# Patient Record
Sex: Female | Born: 1970 | Race: White | Hispanic: No | Marital: Married | Smoking: Never smoker
Health system: Southern US, Community
[De-identification: ages and names within clinical notes are randomized; demographics above are authoritative.]

## PROBLEM LIST (undated history)

## (undated) DIAGNOSIS — R0789 Other chest pain: Secondary | ICD-10-CM

## (undated) DIAGNOSIS — E559 Vitamin D deficiency, unspecified: Secondary | ICD-10-CM

## (undated) DIAGNOSIS — K219 Gastro-esophageal reflux disease without esophagitis: Secondary | ICD-10-CM

## (undated) DIAGNOSIS — I10 Essential (primary) hypertension: Secondary | ICD-10-CM

## (undated) DIAGNOSIS — R002 Palpitations: Secondary | ICD-10-CM

## (undated) DIAGNOSIS — K449 Diaphragmatic hernia without obstruction or gangrene: Secondary | ICD-10-CM

## (undated) DIAGNOSIS — E876 Hypokalemia: Secondary | ICD-10-CM

## (undated) DIAGNOSIS — M722 Plantar fascial fibromatosis: Secondary | ICD-10-CM

## (undated) DIAGNOSIS — K760 Fatty (change of) liver, not elsewhere classified: Secondary | ICD-10-CM

## (undated) DIAGNOSIS — M1990 Unspecified osteoarthritis, unspecified site: Secondary | ICD-10-CM

## (undated) DIAGNOSIS — R0602 Shortness of breath: Secondary | ICD-10-CM

## (undated) HISTORY — PX: OTHER SURGICAL HISTORY: SHX169

## (undated) HISTORY — DX: Unspecified osteoarthritis, unspecified site: M19.90

## (undated) HISTORY — DX: Other chest pain: R07.89

## (undated) HISTORY — DX: Hypokalemia: E87.6

## (undated) HISTORY — DX: Shortness of breath: R06.02

## (undated) HISTORY — DX: Vitamin D deficiency, unspecified: E55.9

## (undated) HISTORY — DX: Diaphragmatic hernia without obstruction or gangrene: K44.9

## (undated) HISTORY — DX: Fatty (change of) liver, not elsewhere classified: K76.0

## (undated) HISTORY — PX: GALLBLADDER SURGERY: SHX652

## (undated) HISTORY — DX: Plantar fascial fibromatosis: M72.2

## (undated) HISTORY — DX: Palpitations: R00.2

## (undated) HISTORY — DX: Gastro-esophageal reflux disease without esophagitis: K21.9

---

## 1995-05-16 HISTORY — PX: FOOT SURGERY: SHX648

## 2002-07-21 ENCOUNTER — Other Ambulatory Visit: Admission: RE | Admit: 2002-07-21 | Discharge: 2002-07-21 | Payer: Self-pay | Admitting: Gynecology

## 2015-08-20 ENCOUNTER — Encounter: Payer: Self-pay | Admitting: Gastroenterology

## 2015-09-06 DIAGNOSIS — J40 Bronchitis, not specified as acute or chronic: Secondary | ICD-10-CM | POA: Diagnosis not present

## 2015-10-14 DIAGNOSIS — K219 Gastro-esophageal reflux disease without esophagitis: Secondary | ICD-10-CM | POA: Diagnosis not present

## 2015-10-14 DIAGNOSIS — K449 Diaphragmatic hernia without obstruction or gangrene: Secondary | ICD-10-CM | POA: Diagnosis not present

## 2015-10-14 DIAGNOSIS — R079 Chest pain, unspecified: Secondary | ICD-10-CM | POA: Diagnosis not present

## 2015-10-19 ENCOUNTER — Ambulatory Visit (INDEPENDENT_AMBULATORY_CARE_PROVIDER_SITE_OTHER): Payer: BLUE CROSS/BLUE SHIELD | Admitting: Gastroenterology

## 2015-10-19 ENCOUNTER — Encounter: Payer: Self-pay | Admitting: Gastroenterology

## 2015-10-19 ENCOUNTER — Other Ambulatory Visit (INDEPENDENT_AMBULATORY_CARE_PROVIDER_SITE_OTHER): Payer: BLUE CROSS/BLUE SHIELD

## 2015-10-19 VITALS — BP 102/70 | HR 72 | Ht 62.5 in | Wt 200.0 lb

## 2015-10-19 DIAGNOSIS — R1013 Epigastric pain: Secondary | ICD-10-CM | POA: Diagnosis not present

## 2015-10-19 DIAGNOSIS — R634 Abnormal weight loss: Secondary | ICD-10-CM

## 2015-10-19 LAB — CBC WITH DIFFERENTIAL/PLATELET
BASOS PCT: 0.5 % (ref 0.0–3.0)
Basophils Absolute: 0 10*3/uL (ref 0.0–0.1)
EOS ABS: 0.1 10*3/uL (ref 0.0–0.7)
EOS PCT: 0.8 % (ref 0.0–5.0)
HEMATOCRIT: 41.7 % (ref 36.0–46.0)
HEMOGLOBIN: 14.1 g/dL (ref 12.0–15.0)
LYMPHS PCT: 24 % (ref 12.0–46.0)
Lymphs Abs: 1.9 10*3/uL (ref 0.7–4.0)
MCHC: 33.8 g/dL (ref 30.0–36.0)
MCV: 86.7 fl (ref 78.0–100.0)
Monocytes Absolute: 0.3 10*3/uL (ref 0.1–1.0)
Monocytes Relative: 4.3 % (ref 3.0–12.0)
Neutro Abs: 5.6 10*3/uL (ref 1.4–7.7)
Neutrophils Relative %: 70.4 % (ref 43.0–77.0)
Platelets: 269 10*3/uL (ref 150.0–400.0)
RBC: 4.81 Mil/uL (ref 3.87–5.11)
RDW: 12.6 % (ref 11.5–15.5)
WBC: 7.9 10*3/uL (ref 4.0–10.5)

## 2015-10-19 LAB — COMPREHENSIVE METABOLIC PANEL
ALBUMIN: 4.5 g/dL (ref 3.5–5.2)
ALK PHOS: 93 U/L (ref 39–117)
ALT: 15 U/L (ref 0–35)
AST: 14 U/L (ref 0–37)
BILIRUBIN TOTAL: 0.4 mg/dL (ref 0.2–1.2)
BUN: 12 mg/dL (ref 6–23)
CALCIUM: 9.8 mg/dL (ref 8.4–10.5)
CHLORIDE: 100 meq/L (ref 96–112)
CO2: 29 mEq/L (ref 19–32)
CREATININE: 0.7 mg/dL (ref 0.40–1.20)
GFR: 97.09 mL/min (ref >60.00)
Glucose, Bld: 106 mg/dL — ABNORMAL HIGH (ref 70–99)
Potassium: 2.9 mEq/L — ABNORMAL LOW (ref 3.5–5.1)
Sodium: 138 mEq/L (ref 135–145)
TOTAL PROTEIN: 7.9 g/dL (ref 6.0–8.3)

## 2015-10-19 LAB — AMYLASE: AMYLASE: 45 U/L (ref 27–131)

## 2015-10-19 LAB — LIPASE: LIPASE: 13 U/L (ref 11.0–59.0)

## 2015-10-19 NOTE — Progress Notes (Signed)
HPI :  45 y/o female here for a new patient evaluation for symptoms as outlined below. She has a reported history of GERD.    Patient reports symptoms for 6 months, worsening over the past 4 months. She reports some discomfort in her epigastric area. She reports after she swallows she has discomfort in her epigastric area with cramping almost instantly. She reports this occurs with eating routinely. She denies odynophagia. No symptoms with liquids or pills. She has some nausea, rare vomiting. She has some mild epigastric discomfort at times when not eating. She reports this with all meals. She thinks she lost 8 lbs in the past few weeks. She is eating less food due to her symptoms, she is afraid to eat due to symptoms. Symptoms can last up to 3 hours after she eats. Pain mostly in the epigastric area, does not radiate anywhere. Pain rated 2/10 at baseline, but can go anywhere from 5-10/10. Liquids go down okay during these symptoms. She denies any NSAIDs at present, she was previously taking ibuprofen but off for 5 months. No FH of esophageal, gastric cancer, or colon cancer. No prior upper endoscopy. She has been on nexium previously once daily, and then switched to Dexilant  daily last Thursday which has helped mildly. She denies any pyrosis, she does have some occasional regurgitation and acid taste in the mouth. If she does not eat she won't have any significant symptoms. She sleeps okay for the most part unless she eats late at night. She denies any blood in the stools or dark / tarry stools.    Past Medical History  Diagnosis Date  . GERD (gastroesophageal reflux disease)   . OA (osteoarthritis)   . Plantar fasciitis   . Vitamin D deficiency   . Hypokalemia      Past Surgical History  Procedure Laterality Date  . Cesarean section  1993  . Foot surgery Right 1997  . Tubal ligation     Family History  Problem Relation Age of Onset  . High blood pressure Mother   . Diabetes Mother    . Kidney cancer Mother    Social History  Substance Use Topics  . Smoking status: Never Smoker   . Smokeless tobacco: Never Used  . Alcohol Use: No   Current Outpatient Prescriptions  Medication Sig Dispense Refill  . albuterol (PROVENTIL HFA;VENTOLIN HFA) 108 (90 Base) MCG/ACT inhaler Inhale 2 puffs into the lungs every 4 (four) hours as needed for wheezing or shortness of breath.    . cyclobenzaprine (FLEXERIL) 10 MG tablet Take 10 mg by mouth 3 (three) times daily.    Marland Kitchen dexlansoprazole (DEXILANT) 60 MG capsule Take 60 mg by mouth daily.    . hydrochlorothiazide (HYDRODIURIL) 25 MG tablet Take by mouth daily. 1-2 tablets daily    . ibuprofen (ADVIL,MOTRIN) 800 MG tablet Take 800 mg by mouth 3 (three) times daily.    Marland Kitchen lisinopril (PRINIVIL,ZESTRIL) 5 MG tablet Take 5 mg by mouth daily.    Marland Kitchen loratadine (CLARITIN) 10 MG tablet Take 10 mg by mouth daily.    . potassium chloride SA (KLOR-CON M20) 20 MEQ tablet Take 20 mEq by mouth daily.    . Vitamin D, Ergocalciferol, (DRISDOL) 50000 units CAPS capsule Take 50,000 Units by mouth. Take 1 capsule twice weekly     No current facility-administered medications for this visit.   No Known Allergies   Review of Systems: All systems reviewed and negative except where noted in HPI.  No results found.  Physical Exam: BP 102/70 mmHg  Pulse 72  Ht 5' 2.5" (1.588 m)  Wt 200 lb (90.719 kg)  BMI 35.97 kg/m2  LMP 10/15/2015 Constitutional: Pleasant,well-developed, female in no acute distress. HEENT: Normocephalic and atraumatic. Conjunctivae are normal. No scleral icterus. Neck supple.  Cardiovascular: Normal rate, regular rhythm.  Pulmonary/chest: Effort normal and breath sounds normal. No wheezing, rales or rhonchi. Abdominal: Soft, nondistended, mild epigastric TTP without rebound or guarding. Bowel sounds active throughout. There are no masses palpable. No hepatomegaly. Extremities: no edema Lymphadenopathy: No cervical adenopathy  noted. Neurological: Alert and oriented to person place and time. Skin: Skin is warm and dry. No rashes noted. Psychiatric: Normal mood and affect. Behavior is normal.   ASSESSMENT AND PLAN: 45 y/o female presenting with postprandial epigastric discomfort as outlined above, with recent weight loss. DDx includes PUD, esophagitis, biliary colic, pancreatic pathology, etc. Time course seems somewhat atypical for biliary colic but possible. Given her strong postprandial component recommend EGD initially although her response to PPI has not been too significant. I discussed risks / benefits of EGD and she wished to proceed, we have an opening tomorrow so will get this done quickly. I will also send basic labs - CBC, LFTs, amylase/lipase. If EGD is negative will consider imaging to evaluate for gallstones. She agreed with the plan.   Ileene PatrickSteven Armbruster, MD [facility] Pager [phone]

## 2015-10-19 NOTE — Patient Instructions (Signed)
Your physician has requested that you go to the basement for lab work before leaving today.  You have been scheduled for an endoscopy. Please follow written instructions given to you at your visit today. If you use inhalers (even only as needed), please bring them with you on the day of your procedure. Your physician has requested that you go to www.[email or web address].[email or web address] and enter the access code given to you at your visit today. This web site gives a general overview about your procedure. However, you should still follow specific instructions given to you by our office regarding your preparation for the procedure.   

## 2015-10-20 ENCOUNTER — Ambulatory Visit (AMBULATORY_SURGERY_CENTER): Payer: BLUE CROSS/BLUE SHIELD | Admitting: Gastroenterology

## 2015-10-20 ENCOUNTER — Encounter: Payer: Self-pay | Admitting: Gastroenterology

## 2015-10-20 VITALS — BP 117/65 | HR 80 | Temp 99.3°F | Resp 23 | Ht 62.5 in | Wt 200.0 lb

## 2015-10-20 DIAGNOSIS — R1013 Epigastric pain: Secondary | ICD-10-CM | POA: Diagnosis not present

## 2015-10-20 DIAGNOSIS — K2950 Unspecified chronic gastritis without bleeding: Secondary | ICD-10-CM | POA: Diagnosis not present

## 2015-10-20 DIAGNOSIS — E109 Type 1 diabetes mellitus without complications: Secondary | ICD-10-CM | POA: Diagnosis not present

## 2015-10-20 MED ORDER — SODIUM CHLORIDE 0.9 % IV SOLN: 500.0000 mL | INTRAVENOUS | Status: DC

## 2015-10-20 NOTE — Op Note (Signed)
[facility] Patient Name: Darlene [name] Procedure Date: 10/20/2015 9:02 AM MRN: [id number] Endoscopist: Viviann Spare P. Adela Lank , MD Age: 45 Referring MD:  Date of Birth: Oct 01, 1970 Gender: Female Procedure:                Upper GI endoscopy Indications:              Epigastric abdominal pain Medicines:                Monitored Anesthesia Care Procedure:                Pre-Anesthesia Assessment:                           - Prior to the procedure, a History and Physical                            was performed, and patient medications and                            allergies were reviewed. The patient's tolerance of                            previous anesthesia was also reviewed. The risks                            and benefits of the procedure and the sedation                            options and risks were discussed with the patient.                            All questions were answered, and informed consent                            was obtained. Prior Anticoagulants: The patient has                            taken no previous anticoagulant or antiplatelet                            agents. ASA Grade Assessment: II - A patient with                            mild systemic disease. After reviewing the risks                            and benefits, the patient was deemed in                            satisfactory condition to undergo the procedure.                           After obtaining informed consent, the endoscope was  passed under direct vision. Throughout the                            procedure, the patient's blood pressure, pulse, and                            oxygen saturations were monitored continuously. The                            Model GIF-HQ190 [id number]) scope was introduced                            through the mouth, and advanced to the second part                            of duodenum. The upper GI endoscopy was                       accomplished without difficulty. The patient                            tolerated the procedure well. Scope In: Scope Out: Findings:                 Esophagogastric landmarks were identified: the                            Z-line was found at 35 cm, the gastroesophageal                            junction was found at 35 cm and the upper extent of                            the gastric folds was found at 35 cm from the                            incisors.                           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                           The exam of the esophagus was otherwise normal.                           The entire examined stomach was normal. Biopsies                            were taken with a cold forceps for Helicobacter                            pylori testing.                           The duodenal bulb and second portion of the  duodenum were normal. Complications:            No immediate complications. Estimated blood loss:                            Minimal. Estimated Blood Loss:     Estimated blood loss was minimal. Impression:               - Esophagogastric landmarks identified.                           - Medium-sized paraesophageal hernia.                           - Normal stomach. Biopsied to rule out H pylori.                           - Normal duodenal bulb and second portion of the                            duodenum. Recommendation:           - Patient has a contact number available for                            emergencies. The signs and symptoms of potential                            delayed complications were discussed with the                            patient. Return to normal activities tomorrow.                            Written discharge instructions were provided to the                            patient.                           - Resume previous diet.                           -  Continue present medications.                           - Await pathology results.                           - May consider further imaging pending path results                            to clarify size of paraesophageal hernia and rule                            out other pathology. If symptoms persist may  consider surgical referral for paraesophageal                            hernia repair if no other cause is found. Viviann SpareSteven P. Champagne Paletta, MD 10/20/2015 9:19:17 AM This report has been signed electronically.

## 2015-10-20 NOTE — Patient Instructions (Signed)
YOU HAD AN ENDOSCOPIC PROCEDURE TODAY AT THE [facility]:   Refer to the procedure report that was given to you for any specific questions about what was found during the examination.  If the procedure report does not answer your questions, please call your gastroenterologist to clarify.  If you requested that your care partner not be given the details of your procedure findings, then the procedure report has been included in a sealed envelope for you to review at your convenience later.  YOU SHOULD EXPECT: Some feelings of bloating in the abdomen. Passage of more gas than usual.  Walking can help get rid of the air that was put into your GI tract during the procedure and reduce the bloating. If you had a lower endoscopy (such as a colonoscopy or flexible sigmoidoscopy) you may notice spotting of blood in your stool or on the toilet paper. If you underwent a bowel prep for your procedure, you may not have a normal bowel movement for a few days.  Please Note:  You might notice some irritation and congestion in your nose or some drainage.  This is from the oxygen used during your procedure.  There is no need for concern and it should clear up in a day or so.  SYMPTOMS TO REPORT IMMEDIATELY:    Following upper endoscopy (EGD)  Vomiting of blood or coffee ground material  New chest pain or pain under the shoulder blades  Painful or persistently difficult swallowing  New shortness of breath  Fever of 100F or higher  Black, tarry-looking stools  For urgent or emergent issues, a gastroenterologist can be reached at any hour by calling [phone].   DIET: Your first meal following the procedure should be a small meal and then it is ok to progress to your normal diet. Heavy or fried foods are harder to digest and may make you feel nauseous or bloated.  Likewise, meals heavy in dairy and vegetables can increase bloating.  Drink plenty of fluids but you should avoid alcoholic beverages  for 24 hours.  ACTIVITY:  You should plan to take it easy for the rest of today and you should NOT DRIVE or use heavy machinery until tomorrow (because of the sedation medicines used during the test).    FOLLOW UP: Our staff will call the number listed on your records the next business day following your procedure to check on you and address any questions or concerns that you may have regarding the information given to you following your procedure. If we do not reach you, we will leave a message.  However, if you are feeling well and you are not experiencing any problems, there is no need to return our call.  We will assume that you have returned to your regular daily activities without incident.  If any biopsies were taken you will be contacted by phone or by letter within the next 1-3 weeks.  Please call us at [phone] if you have not heard about the biopsies in 3 weeks.    SIGNATURES/CONFIDENTIALITY: You and/or your care partner have signed paperwork which will be entered into your electronic medical record.  These signatures attest to the fact that that the information above on your After Visit Summary has been reviewed and is understood.  Full responsibility of the confidentiality of this discharge information lies with you and/or your care-partner.  Thank you for letting us take care of your healthcare needs today. 

## 2015-10-20 NOTE — Progress Notes (Signed)
Dental advisory given to patient 

## 2015-10-20 NOTE — Progress Notes (Signed)
A/ox3 pleased with MAC, report to Maralyn SagoSarah RN, teeth as pre-procedure

## 2015-10-20 NOTE — Progress Notes (Signed)
Called to room to assist during endoscopic procedure.  Patient ID and intended procedure confirmed with present staff. Received instructions for my participation in the procedure from the performing physician.  

## 2015-10-21 ENCOUNTER — Telehealth: Payer: Self-pay | Admitting: *Deleted

## 2015-10-21 NOTE — Telephone Encounter (Signed)
  Follow up Call-  Call back number 10/20/2015  Post procedure Call Back phone  # [phone]  Permission to leave phone message Yes     Patient questions:  Do you have a fever, pain , or abdominal swelling? No. Pain Score  0 *  Have you tolerated food without any problems? Yes.    Have you been able to return to your normal activities? Yes.    Do you have any questions about your discharge instructions: Diet   No. Medications  No. Follow up visit  No.  Do you have questions or concerns about your Care? No.  Actions: * If pain score is 4 or above: No action needed, pain <4.

## 2015-11-01 ENCOUNTER — Other Ambulatory Visit: Payer: Self-pay | Admitting: *Deleted

## 2015-11-01 ENCOUNTER — Encounter: Payer: Self-pay | Admitting: *Deleted

## 2015-11-01 DIAGNOSIS — R1013 Epigastric pain: Secondary | ICD-10-CM

## 2015-11-08 ENCOUNTER — Ambulatory Visit (INDEPENDENT_AMBULATORY_CARE_PROVIDER_SITE_OTHER)
Admission: RE | Admit: 2015-11-08 | Discharge: 2015-11-08 | Disposition: A | Discharge status: Home / Self Care | Payer: BLUE CROSS/BLUE SHIELD | Source: Ambulatory Visit | Attending: Gastroenterology | Admitting: Gastroenterology

## 2015-11-08 DIAGNOSIS — R1013 Epigastric pain: Secondary | ICD-10-CM

## 2015-11-08 DIAGNOSIS — K449 Diaphragmatic hernia without obstruction or gangrene: Secondary | ICD-10-CM | POA: Diagnosis not present

## 2015-11-08 MED ORDER — IOPAMIDOL (ISOVUE-300) INJECTION 61%
100.0000 mL | Freq: Once | INTRAVENOUS | Status: AC | PRN
  Administered 2015-11-08: 100 mL via INTRAVENOUS

## 2015-11-09 ENCOUNTER — Other Ambulatory Visit: Payer: Self-pay | Admitting: *Deleted

## 2015-11-09 ENCOUNTER — Telehealth: Payer: Self-pay | Admitting: Gastroenterology

## 2015-11-09 DIAGNOSIS — R109 Unspecified abdominal pain: Secondary | ICD-10-CM

## 2015-11-09 NOTE — Telephone Encounter (Signed)
See results note. 

## 2015-11-10 ENCOUNTER — Telehealth: Payer: Self-pay | Admitting: Gastroenterology

## 2015-11-10 NOTE — Telephone Encounter (Signed)
See results note. 

## 2015-11-12 ENCOUNTER — Ambulatory Visit (HOSPITAL_COMMUNITY)
Admission: RE | Admit: 2015-11-12 | Discharge: 2015-11-12 | Disposition: A | Discharge status: Home / Self Care | Payer: BLUE CROSS/BLUE SHIELD | Source: Ambulatory Visit | Attending: Gastroenterology | Admitting: Gastroenterology

## 2015-11-12 DIAGNOSIS — R109 Unspecified abdominal pain: Secondary | ICD-10-CM | POA: Diagnosis not present

## 2015-11-12 DIAGNOSIS — K760 Fatty (change of) liver, not elsewhere classified: Secondary | ICD-10-CM | POA: Diagnosis not present

## 2015-11-12 DIAGNOSIS — K449 Diaphragmatic hernia without obstruction or gangrene: Secondary | ICD-10-CM | POA: Diagnosis not present

## 2015-11-15 ENCOUNTER — Other Ambulatory Visit (HOSPITAL_COMMUNITY): Payer: Self-pay | Admitting: General Surgery

## 2015-11-15 DIAGNOSIS — K449 Diaphragmatic hernia without obstruction or gangrene: Secondary | ICD-10-CM

## 2015-11-17 ENCOUNTER — Other Ambulatory Visit (HOSPITAL_COMMUNITY): Payer: Self-pay | Admitting: General Surgery

## 2015-11-17 DIAGNOSIS — K449 Diaphragmatic hernia without obstruction or gangrene: Secondary | ICD-10-CM

## 2015-11-24 ENCOUNTER — Encounter (HOSPITAL_COMMUNITY)
Admission: RE | Admit: 2015-11-24 | Discharge: 2015-11-24 | Disposition: A | Discharge status: Home / Self Care | Payer: BLUE CROSS/BLUE SHIELD | Source: Ambulatory Visit | Attending: General Surgery | Admitting: General Surgery

## 2015-11-24 DIAGNOSIS — K449 Diaphragmatic hernia without obstruction or gangrene: Secondary | ICD-10-CM | POA: Insufficient documentation

## 2015-11-24 DIAGNOSIS — R109 Unspecified abdominal pain: Secondary | ICD-10-CM | POA: Diagnosis not present

## 2015-11-24 MED ORDER — TECHNETIUM TC 99M SULFUR COLLOID
2.0000 | Freq: Once | INTRAVENOUS | Status: AC | PRN
  Administered 2015-11-24: 2 via INTRAVENOUS

## 2015-11-25 ENCOUNTER — Ambulatory Visit (HOSPITAL_COMMUNITY)
Admission: RE | Admit: 2015-11-25 | Discharge: 2015-11-25 | Disposition: A | Discharge status: Home / Self Care | Payer: BLUE CROSS/BLUE SHIELD | Source: Ambulatory Visit | Attending: General Surgery | Admitting: General Surgery

## 2015-11-25 DIAGNOSIS — K449 Diaphragmatic hernia without obstruction or gangrene: Secondary | ICD-10-CM | POA: Diagnosis not present

## 2015-11-25 DIAGNOSIS — R1310 Dysphagia, unspecified: Secondary | ICD-10-CM | POA: Diagnosis not present

## 2015-12-20 ENCOUNTER — Ambulatory Visit: Payer: Self-pay | Admitting: General Surgery

## 2015-12-20 DIAGNOSIS — K449 Diaphragmatic hernia without obstruction or gangrene: Secondary | ICD-10-CM | POA: Diagnosis not present

## 2015-12-20 NOTE — H&P (Signed)
History of Present Illness Darlene Filler MD; 12/20/2015 12:06 PM) Patient words: Darlene [name].  The patient is a 45 year old [name] who presents with a hiatal Darlene [name]. 45 year old [name] who follows back up secondary to paraesophageal Darlene [name]. Patient underwent Gastrografin esophagogram which reveals a large paraesophageal Darlene [name] Patient underwent gastric emptying study which reveals normal emptying of the stomach.  Patient states continues with pain, nausea, vomiting after meals, and shortness of breath as she squats. ________________________________________________________________ Patient is a 45 year old [name] who is referred by Dr. Adela [name] for evaluation of hiatal Darlene [name]. Patient states that she's had some dysphagia. She states that she feels food gets stuck in her chest. She states that she gets full very quickly. Patient's been avoiding things like bread and meat and bread disease of been causing this to worsen. She does state that she's had some emesis. She does not endorse any reflux. States that she is short of breath when she squats down.  Patient underwent a CT scan which reveals a moderate size hiatal Darlene [name]. Patient also underwent EGD which confirms the same.   Allergies (Darlene Eversole, LPN; 12/13/1912 78:29 AM) No Known Drug Allergies06/30/2017  Medication History (Darlene Eversole, LPN; 09/17/2128 86:57 AM) ProAir HFA (108 (90 Base)MCG/ACT Aerosol Soln, Inhalation) Active. Lisinopril (  Tablet, Oral) Active. Klor-Con M20 ( Tablet ER, Oral) Active. Vitamin D (Ergocalciferol) (50000UNIT Capsule, Oral twice weekly) Active. Loratadine (  Tablet, Oral) Active. Esomeprazole Magnesium (  Capsule DR, Oral) Active. HydroCHLOROthiazide (  Tablet, Oral) Active. Ibuprofen (  Tablet, Oral) Active. Acetaminophen-Codeine #3 (300-30MG  Tablet, Oral) Active. Medications Reconciled    Review of Systems Darlene Filler, MD; 12/20/2015 12:7 PM) General  Present- Appetite Loss and Weight Loss. Not Present- Chills, Fatigue, Fever, Night Sweats and Weight Gain. Skin Not Present- Change in Wart/Mole, Dryness, Hives, Jaundice, New Lesions, Non-Healing Wounds, Rash and Ulcer. HEENT Not Present- Earache, Hearing Loss, Hoarseness, Nose Bleed, Oral Ulcers, Ringing in the Ears, Seasonal Allergies, Sinus Pain, Sore Throat, Visual Disturbances, Wears glasses/contact lenses and Yellow Eyes. Respiratory Present- Difficulty Breathing. Not Present- Bloody sputum, Chronic Cough, Snoring and Wheezing. Breast Not Present- Breast Mass, Breast Pain, Nipple Discharge and Skin Changes. Cardiovascular Present- Shortness of Breath. Not Present- Chest Pain, Difficulty Breathing Lying Down, Leg Cramps, Palpitations, Rapid Heart Rate and Swelling of Extremities. Gastrointestinal Present- Abdominal Pain, Nausea and Vomiting. Not Present- Bloating, Bloody Stool, Change in Bowel Habits, Chronic diarrhea, Constipation, Difficulty Swallowing, Excessive gas, Gets full quickly at meals, Hemorrhoids, Indigestion and Rectal Pain. [name] Not Present- Frequency, Nocturia, Painful Urination, Pelvic Pain and Urgency. Musculoskeletal Not Present- Back Pain, Joint Pain, Joint Stiffness, Muscle Pain, Muscle Weakness and Swelling of Extremities. Neurological Not Present- Decreased Memory, Fainting, Headaches, Numbness, Seizures, Tingling, Tremor, Trouble walking and Weakness. Psychiatric Not Present- Anxiety, Bipolar, Change in Sleep Pattern, Depression, Fearful and Frequent crying. Endocrine Not Present- Cold Intolerance, Excessive Hunger, Hair Changes, Heat Intolerance, Hot flashes and New Diabetes. Hematology Not Present- Blood Thinners, Easy Bruising, Excessive bleeding, Gland problems, HIV and Persistent Infections.  Vitals (Darlene Eversole LPN; 12/16/6960 95:28 AM) 12/20/2015 11:56 AM Weight: 193 lb Height: Darlene.5in Body Surface Area: 1.89 m Body Mass Index: 34.74 kg/m   Temp.: 97.63F(Oral)  Pulse: 100 (Regular)  BP: 112/80 (Sitting, Left Arm, Standard)       Physical Exam Darlene Filler, MD; 12/20/2015 12:7 PM) General Mental Status-Alert. General Appearance-Consistent with stated age. Hydration-Well hydrated. Voice-Normal.  Head and Neck Head-normocephalic, atraumatic with no lesions or palpable masses. Trachea-midline. Thyroid Gland Characteristics - normal size and consistency.  Eye Eyeball -  Bilateral-Extraocular movements intact. Sclera/Conjunctiva - Bilateral-No scleral icterus.  Chest and Lung Exam Chest and lung exam reveals -quiet, even and easy respiratory effort with no use of accessory muscles and on auscultation, normal breath sounds, no adventitious sounds and normal vocal resonance. Inspection Chest Wall - Normal. Back - normal.  Breast Breast - Left-Symmetric, Non Tender, No Biopsy scars, no Dimpling, No Inflammation, No Lumpectomy scars, No Mastectomy scars, No Peau d' Orange. Breast - Right-Symmetric, Non Tender, No Biopsy scars, no Dimpling, No Inflammation, No Lumpectomy scars, No Mastectomy scars, No Peau d' Orange. Breast Lump-No Palpable Breast Mass.  Cardiovascular Cardiovascular examination reveals -normal heart sounds, regular rate and rhythm with no murmurs and normal pedal pulses bilaterally.  Abdomen Inspection Normal Exam - No Hernias. Skin - Scar - no surgical scars. Palpation/Percussion Normal exam - Soft, Non Tender, No Rebound tenderness, No Rigidity (guarding) and No hepatosplenomegaly. Auscultation Normal exam - Bowel sounds normal.  Neurologic Neurologic evaluation reveals -alert and oriented x 3 with no impairment of recent or remote memory. Mental Status-Normal.  Musculoskeletal Normal Exam - Left-Upper Extremity Strength Normal and Lower Extremity Strength Normal. Normal Exam - Right-Upper Extremity Strength Normal and Lower Extremity Strength  Normal.  Lymphatic Head & Neck General Head & Neck Lymphatics: Bilateral - Description - Normal. Axillary General Axillary Region: Bilateral - Description - Normal. Tenderness - Non Tender. Femoral & Inguinal Generalized Femoral & Inguinal Lymphatics: Bilateral - Description - Normal. Tenderness - Non Tender.    Assessment & Plan Darlene [name](Camia Dipinto MD; 12/20/2015 12:07 PM) HIATAL Darlene [name] (K44.9) Impression: Patient is a 45 year old [name] with a moderate size hiatal Darlene [name], symptomatic  1. Will proceed to the operating room for a robotic had a Darlene [name] repair with mesh, and Nissen fundoplication. Darlene. Discussed with the patient the risks and benefits to the procedure to include but not limited to: Infection, bleeding, damage to structures, possible pneumothorax, possible recurrence. Patient was understanding and wished to proceed.

## 2016-01-03 ENCOUNTER — Ambulatory Visit (INDEPENDENT_AMBULATORY_CARE_PROVIDER_SITE_OTHER): Payer: BLUE CROSS/BLUE SHIELD | Admitting: Physician Assistant

## 2016-01-03 ENCOUNTER — Encounter: Payer: Self-pay | Admitting: Physician Assistant

## 2016-01-03 VITALS — BP 111/80 | HR 103 | Temp 97.9°F | Ht 62.5 in | Wt 190.6 lb

## 2016-01-03 DIAGNOSIS — R601 Generalized edema: Secondary | ICD-10-CM | POA: Diagnosis not present

## 2016-01-03 DIAGNOSIS — I471 Supraventricular tachycardia, unspecified: Secondary | ICD-10-CM

## 2016-01-03 DIAGNOSIS — K219 Gastro-esophageal reflux disease without esophagitis: Secondary | ICD-10-CM

## 2016-01-03 DIAGNOSIS — K449 Diaphragmatic hernia without obstruction or gangrene: Secondary | ICD-10-CM

## 2016-01-03 DIAGNOSIS — Z6834 Body mass index (BMI) 34.0-34.9, adult: Secondary | ICD-10-CM | POA: Diagnosis not present

## 2016-01-03 DIAGNOSIS — E876 Hypokalemia: Secondary | ICD-10-CM

## 2016-01-03 DIAGNOSIS — J309 Allergic rhinitis, unspecified: Secondary | ICD-10-CM | POA: Diagnosis not present

## 2016-01-03 DIAGNOSIS — R609 Edema, unspecified: Secondary | ICD-10-CM | POA: Insufficient documentation

## 2016-01-03 MED ORDER — ESOMEPRAZOLE MAGNESIUM 40 MG PO CPDR: 40.0000 mg | DELAYED_RELEASE_CAPSULE | Freq: Every day | ORAL | 11 refills | Status: DC

## 2016-01-03 NOTE — Progress Notes (Signed)
BP 111/80 (BP Location: Left Arm, Patient Position: Sitting, Cuff Size: Normal)   Pulse (!) 103   Temp 97.9 F (36.6 C) (Oral)   Ht 5' 2.5" (1.588 m)   Wt 190 lb 9.6 oz (86.5 kg)   LMP 12/31/2015   BMI 34.31 kg/m    Subjective:    Patient ID: Darlene [name], female    DOB: 05/29/1970, 45 y.o.   MRN: [id number]  HPI: Darlene [name] is a 45 y.o. female presenting on 01/03/2016 for Shortness of Breath (during work today, feels like someone is sitting on her chest, she does have a hiatal hernia that she has scheduled for surgery next month)   HPI Patient with weakness and tachycardia over the past few days.  She will be having hiatal hernia repair September 15.  She is having severe symptoms at times. No are no specific foods that seem to be more causative or not.  She will go a long periosdnwithout eating therefore I am concerned about hypoglycemia or some mild dehydration.  Discussed using supplemental shakes or meal replacement if she cannot physically eating solids.  This will eliminate long periods of not eating.  In addition stop caffeine for the sake of her intermittent tachycardia.   Relevant past medical, surgical, family and social history reviewed and updated as indicated. Interim medical history since our last visit reviewed. Allergies and medications reviewed and updated.  Review of Systems  Constitutional: Negative.  Negative for activity change, fatigue and fever.  HENT: Negative.   Eyes: Negative.   Respiratory: Positive for shortness of breath. Negative for cough, choking, chest tightness and wheezing.   Cardiovascular: Negative.  Negative for chest pain.  Gastrointestinal: Positive for abdominal pain and vomiting. Negative for blood in stool, constipation, diarrhea and nausea.  Endocrine: Negative.   Genitourinary: Negative.  Negative for dysuria.  Musculoskeletal: Negative.   Skin: Negative.   Neurological: Negative.   Hematological: Does not bruise/bleed easily.    Psychiatric/Behavioral: Negative.     Per HPI unless specifically indicated above  Social History   Social History  . Marital status: Legally Separated    Spouse name: N/A  . Number of children: 3  . Years of education: N/A   Occupational History  . TEXTILE    Social History Main Topics  . Smoking status: Never Smoker  . Smokeless tobacco: Never Used  . Alcohol use No  . Drug use: No  . Sexual activity: Not on file   Other Topics Concern  . Not on file   Social History Narrative  . No narrative on file    Past Surgical History:  Procedure Laterality Date  . CESAREAN SECTION  1993  . FOOT SURGERY Right 1997  . tubal ligation      Family History  Problem Relation Age of Onset  . High blood pressure Mother   . Diabetes Mother   . Kidney cancer Mother   . Colon cancer Neg Hx       Medication List       Accurate as of 01/03/16  4:05 PM. Always use your most recent med list.          albuterol 108 (90 Base) MCG/ACT inhaler Commonly known as:  PROVENTIL HFA;VENTOLIN HFA Inhale 2 puffs into the lungs every 4 (four) hours as needed for wheezing or shortness of breath.   esomeprazole 40 MG capsule Commonly known as:  NEXIUM Take 40 mg by mouth daily.   hydrochlorothiazide 25 MG tablet Commonly known  as:  HYDRODIURIL Take by mouth daily. 1-2 tablets daily   KLOR-CON M20 20 MEQ tablet Generic drug:  potassium chloride SA Take 20 mEq by mouth daily.   lisinopril 5 MG tablet Commonly known as:  PRINIVIL,ZESTRIL Take 5 mg by mouth daily.   loratadine 10 MG tablet Commonly known as:  CLARITIN Take 10 mg by mouth daily.   Vitamin D (Ergocalciferol) 50000 units Caps capsule Commonly known as:  DRISDOL Take 50,000 Units by mouth. Take 1 capsule twice weekly          Objective:    BP 111/80 (BP Location: Left Arm, Patient Position: Sitting, Cuff Size: Normal)   Pulse (!) 103   Temp 97.9 F (36.6 C) (Oral)   Ht 5' 2.5" (1.588 m)   Wt 190 lb 9.6  oz (86.5 kg)   LMP 12/31/2015   BMI 34.31 kg/m   Wt Readings from Last 3 Encounters:  01/03/16 190 lb 9.6 oz (86.5 kg)  10/20/15 200 lb (90.7 kg)  10/19/15 200 lb (90.7 kg)    Physical Exam  Constitutional: She is oriented to person, place, and time. She appears well-developed and well-nourished.  HENT:  Head: Normocephalic and atraumatic.  Eyes: Conjunctivae and EOM are normal. Pupils are equal, round, and reactive to light.  Neck: Normal range of motion. Neck supple.  Cardiovascular: Normal rate, regular rhythm, normal heart sounds and intact distal pulses.   Pulmonary/Chest: Effort normal and breath sounds normal.  Abdominal: Soft. Bowel sounds are normal. She exhibits no distension and no mass. There is no tenderness. There is no rebound and no guarding.  Epigastric tender mild, exacerbates pressure in thoracic area.  Neurological: She is alert and oriented to person, place, and time. She has normal reflexes.  Skin: Skin is warm and dry. No rash noted.  Psychiatric: She has a normal mood and affect. Her behavior is normal. Judgment and thought content normal.      Results for orders placed or performed in visit on 10/19/15  CBC with Differential/Platelet  Result Value Ref Range   WBC 7.9 4.0 - 10.5 K/uL   RBC 4.81 3.87 - 5.11 Mil/uL   Hemoglobin 14.1 12.0 - 15.0 g/dL   HCT 41.7 36.0 - 46.0 %   MCV 86.7 78.0 - 100.0 fl   MCHC 33.8 30.0 - 36.0 g/dL   RDW 12.6 11.5 - 15.5 %   Platelets 269.0 150.0 - 400.0 K/uL   Neutrophils Relative % 70.4 43.0 - 77.0 %   Lymphocytes Relative 24.0 12.0 - 46.0 %   Monocytes Relative 4.3 3.0 - 12.0 %   Eosinophils Relative 0.8 0.0 - 5.0 %   Basophils Relative 0.5 0.0 - 3.0 %   Neutro Abs 5.6 1.4 - 7.7 K/uL   Lymphs Abs 1.9 0.7 - 4.0 K/uL   Monocytes Absolute 0.3 0.1 - 1.0 K/uL   Eosinophils Absolute 0.1 0.0 - 0.7 K/uL   Basophils Absolute 0.0 0.0 - 0.1 K/uL  Comp Met (CMET)  Result Value Ref Range   Sodium 138 135 - 145 mEq/L    Potassium 2.9 (L) 3.5 - 5.1 mEq/L   Chloride 100 96 - 112 mEq/L   CO2 29 19 - 32 mEq/L   Glucose, Bld 106 (H) 70 - 99 mg/dL   BUN 12 6 - 23 mg/dL   Creatinine, Ser 0.70 0.40 - 1.20 mg/dL   Total Bilirubin 0.4 0.2 - 1.2 mg/dL   Alkaline Phosphatase 93 39 - 117 U/L   AST 14  0 - 37 U/L   ALT 15 0 - 35 U/L   Total Protein 7.9 6.0 - 8.3 g/dL   Albumin 4.5 3.5 - 5.2 g/dL   Calcium 9.8 8.4 - 10.5 mg/dL   GFR 97.09 >60.00 mL/min  Amylase  Result Value Ref Range   Amylase 45 27 - 131 U/L  Lipase  Result Value Ref Range   Lipase 13.0 11.0 - 59.0 U/L      Assessment & Plan:   Problem List Items Addressed This Visit      Respiratory   Allergic rhinitis   Hiatal hernia   Relevant Orders   CBC with Differential/Platelet     Digestive   Esophageal reflux   Relevant Medications   esomeprazole (NEXIUM) 40 MG capsule   Other Relevant Orders   CBC with Differential/Platelet     Other   Edema   Hypokalemia   Relevant Orders   CBC with Differential/Platelet   CMP14+EGFR   Body mass index 34.0-34.9, adult - Primary    Other Visit Diagnoses    Paroxysmal supraventricular tachycardia ([place])           Follow up plan: Return in about 2 months (around 03/04/2016) for recheck and labs.  Terald Sleeper PA-C [place] [address]  [place], [place] [address] [phone]   01/03/2016, 4:05 PM

## 2016-01-03 NOTE — Patient Instructions (Signed)
Nonspecific Tachycardia Tachycardia is a faster than normal heartbeat (more than 100 beats per minute). In adults, the heart normally beats between 60 and 100 times a minute. A fast heartbeat may be a normal response to exercise or stress. It does not necessarily mean that something is wrong. However, sometimes when your heart beats too fast it may not be able to pump enough blood to the rest of your body. This can result in chest pain, shortness of breath, dizziness, and even fainting. Nonspecific tachycardia means that the specific cause or pattern of your tachycardia is unknown. CAUSES  Tachycardia may be harmless or it may be due to a more serious underlying cause. Possible causes of tachycardia include:  Exercise or exertion.  Fever.  Pain or injury.  Infection.  Loss of body fluids (dehydration).  Overactive thyroid.  Lack of red blood cells (anemia).  Anxiety and stress.  Alcohol.  Caffeine.  Tobacco products.  Diet pills.  Illegal drugs.  Heart disease. SYMPTOMS  Rapid or irregular heartbeat (palpitations).  Suddenly feeling your heart beating (cardiac awareness).  Dizziness.  Tiredness (fatigue).  Shortness of breath.  Chest pain.  Nausea.  Fainting. DIAGNOSIS  Your caregiver will perform a physical exam and take your medical history. In some cases, a heart specialist (cardiologist) may be consulted. Your caregiver may also order:  Blood tests.  Electrocardiography. This test records the electrical activity of your heart.  A heart monitoring test. TREATMENT  Treatment will depend on the likely cause of your tachycardia. The goal is to treat the underlying cause of your tachycardia. Treatment methods may include:  Replacement of fluids or blood through an intravenous (IV) tube for moderate to severe dehydration or anemia.  New medicines or changes in your current medicines.  Diet and lifestyle changes.  Treatment for certain  infections.  Stress relief or relaxation methods. HOME CARE INSTRUCTIONS   Rest.  Drink enough fluids to keep your urine clear or pale yellow.  Do not smoke.  Avoid:  Caffeine.  Tobacco.  Alcohol.  Chocolate.  Stimulants such as over-the-counter diet pills or pills that help you stay awake.  Situations that cause anxiety or stress.  Illegal drugs such as marijuana, phencyclidine (PCP), and cocaine.  Only take medicine as directed by your caregiver.  Keep all follow-up appointments as directed by your caregiver. SEEK IMMEDIATE MEDICAL CARE IF:   You have pain in your chest, upper arms, jaw, or neck.  You become weak, dizzy, or feel faint.  You have palpitations that will not go away.  You vomit, have diarrhea, or pass blood in your stool.  Your skin is cool, pale, and wet.  You have a fever that will not go away with rest, fluids, and medicine. MAKE SURE YOU:   Understand these instructions.  Will watch your condition.  Will get help right away if you are not doing well or get worse.   This information is not intended to replace advice given to you by your health care provider. Make sure you discuss any questions you have with your health care provider.   Document Released: 06/08/2004 Document Revised: 07/24/2011 Document Reviewed: 11/13/2014 Elsevier Interactive Patient Education 2016 [employer]  

## 2016-01-04 ENCOUNTER — Encounter: Payer: Self-pay | Admitting: *Deleted

## 2016-01-04 ENCOUNTER — Encounter: Payer: Self-pay | Admitting: Physician Assistant

## 2016-01-04 LAB — CBC WITH DIFFERENTIAL/PLATELET
BASOS: 0 %
Basophils Absolute: 0 10*3/uL (ref 0.0–0.2)
EOS (ABSOLUTE): 0.1 10*3/uL (ref 0.0–0.4)
EOS: 1 %
HEMATOCRIT: 40.3 % (ref 34.0–46.6)
HEMOGLOBIN: 13.5 g/dL (ref 11.1–15.9)
IMMATURE GRANS (ABS): 0 10*3/uL (ref 0.0–0.1)
IMMATURE GRANULOCYTES: 0 %
Lymphocytes Absolute: 2.4 10*3/uL (ref 0.7–3.1)
Lymphs: 27 %
MCH: 29.8 pg (ref 26.6–33.0)
MCHC: 33.5 g/dL (ref 31.5–35.7)
MCV: 89 fL (ref 79–97)
Monocytes Absolute: 0.4 10*3/uL (ref 0.1–0.9)
Monocytes: 5 %
NEUTROS ABS: 5.7 10*3/uL (ref 1.4–7.0)
Neutrophils: 67 %
Platelets: 256 10*3/uL (ref 150–379)
RBC: 4.53 x10E6/uL (ref 3.77–5.28)
RDW: 12.5 % (ref 12.3–15.4)
WBC: 8.7 10*3/uL (ref 3.4–10.8)

## 2016-01-04 LAB — CMP14+EGFR
A/G RATIO: 1.6 (ref 1.2–2.2)
ALBUMIN: 4.4 g/dL (ref 3.5–5.5)
ALT: 12 IU/L (ref 0–32)
AST: 10 IU/L (ref 0–40)
Alkaline Phosphatase: 95 IU/L (ref 39–117)
BUN / CREAT RATIO: 15 (ref 9–23)
BUN: 13 mg/dL (ref 6–24)
CALCIUM: 9.3 mg/dL (ref 8.7–10.2)
CHLORIDE: 100 mmol/L (ref 96–106)
CO2: 24 mmol/L (ref 18–29)
Creatinine, Ser: 0.85 mg/dL (ref 0.57–1.00)
GFR, EST AFRICAN AMERICAN: 97 mL/min/{1.73_m2} (ref >59)
GFR, EST NON AFRICAN AMERICAN: 84 mL/min/{1.73_m2} (ref >59)
Globulin, Total: 2.8 g/dL (ref 1.5–4.5)
Glucose: 100 mg/dL — ABNORMAL HIGH (ref 65–99)
POTASSIUM: 4 mmol/L (ref 3.5–5.2)
Sodium: 141 mmol/L (ref 134–144)
TOTAL PROTEIN: 7.2 g/dL (ref 6.0–8.5)

## 2016-01-06 ENCOUNTER — Encounter: Payer: Self-pay | Admitting: Gastroenterology

## 2016-01-07 ENCOUNTER — Encounter (HOSPITAL_COMMUNITY)
Admission: RE | Admit: 2016-01-07 | Discharge: 2016-01-07 | Disposition: A | Discharge status: Home / Self Care | Payer: BLUE CROSS/BLUE SHIELD | Source: Ambulatory Visit | Attending: General Surgery | Admitting: General Surgery

## 2016-01-07 ENCOUNTER — Other Ambulatory Visit: Payer: Self-pay

## 2016-01-07 ENCOUNTER — Encounter (HOSPITAL_COMMUNITY): Payer: Self-pay

## 2016-01-07 DIAGNOSIS — I10 Essential (primary) hypertension: Secondary | ICD-10-CM | POA: Diagnosis not present

## 2016-01-07 DIAGNOSIS — Z01812 Encounter for preprocedural laboratory examination: Secondary | ICD-10-CM | POA: Diagnosis not present

## 2016-01-07 DIAGNOSIS — Z01818 Encounter for other preprocedural examination: Secondary | ICD-10-CM | POA: Diagnosis not present

## 2016-01-07 DIAGNOSIS — K449 Diaphragmatic hernia without obstruction or gangrene: Secondary | ICD-10-CM | POA: Insufficient documentation

## 2016-01-07 HISTORY — DX: Essential (primary) hypertension: I10

## 2016-01-07 LAB — BASIC METABOLIC PANEL
ANION GAP: 9 (ref 5–15)
BUN: 17 mg/dL (ref 6–20)
CALCIUM: 9.5 mg/dL (ref 8.9–10.3)
CO2: 27 mmol/L (ref 22–32)
Chloride: 103 mmol/L (ref 101–111)
Creatinine, Ser: 0.71 mg/dL (ref 0.44–1.00)
GFR calc Af Amer: >60 mL/min (ref >60)
GLUCOSE: 99 mg/dL (ref 65–99)
POTASSIUM: 3.2 mmol/L — AB (ref 3.5–5.1)
SODIUM: 139 mmol/L (ref 135–145)

## 2016-01-07 LAB — HCG, SERUM, QUALITATIVE: Preg, Serum: NEGATIVE

## 2016-01-07 NOTE — Patient Instructions (Addendum)
Darlene [name]  01/07/2016   Your procedure is scheduled on: 01/28/16  [facility]  [facility] to 3rd floor to  Short Stay Center at 11:00 AM.  Call this number if you have problems the morning of surgery [phone]   Remember: ONLY 1 PERSON MAY GO WITH YOU TO SHORT STAY TO GET  READY MORNING OF YOUR SURGERY.  Do not eat food or drink liquids :After Midnight.  You may have clear liquids the morning of surgery until 7:00 AM.  Then nothing to eat or drink.     Take these medicines the morning of surgery with A SIP OF WATER: Esomeprazole (Nexium), Loratidine (Claritin), Albuterol inhaler if needed.                               You may not have any metal on your body including hair pins and              piercings  Do not wear jewelry, make-up, lotions, powders or perfumes, deodorant             Do not wear nail polish.  Do not shave  48 hours prior to surgery.              Men may shave face and neck.   Do not bring valuables to the hospital. [facility] IS NOT             RESPONSIBLE   FOR VALUABLES.  Contacts, dentures or bridgework may not be worn into surgery.  Leave suitcase in the car. After surgery it may be brought to your room.               Please read over the following fact sheets you were given: _____________________________________________________________________             [facility] - Preparing for Surgery Before surgery, you can play an important role.  Because skin is not sterile, your skin needs to be as free of germs as possible.  You can reduce the number of germs on your skin by washing with CHG (chlorahexidine gluconate) soap before surgery.  CHG is an antiseptic cleaner which kills germs and bonds with the skin to continue killing germs even after washing. Please DO NOT use if you have an allergy to CHG or antibacterial soaps.  If your skin becomes reddened/irritated stop using the CHG and inform your  nurse when you arrive at Short Stay. Do not shave (including legs and underarms) for at least 48 hours prior to the first CHG shower.  You may shave your face/neck. Please follow these instructions carefully:  1.  Shower with CHG Soap the night before surgery and the  morning of Surgery.  2.  If you choose to wash your hair, wash your hair first as usual with your  normal  shampoo.  3.  After you shampoo, rinse your hair and body thoroughly to remove the  shampoo.                           4.  Use CHG as you would any other liquid soap.  You can apply chg directly  to the skin and wash  Gently with a scrungie or clean washcloth.  5.  Apply the CHG Soap to your body ONLY FROM THE NECK DOWN.   Do not use on face/ open                           Wound or open sores. Avoid contact with eyes, ears mouth and genitals (private parts).                       Wash face,  Genitals (private parts) with your normal soap.             6.  Wash thoroughly, paying special attention to the area where your surgery  will be performed.  7.  Thoroughly rinse your body with warm water from the neck down.  8.  DO NOT shower/wash with your normal soap after using and rinsing off  the CHG Soap.                9.  Pat yourself dry with a clean towel.            10.  Wear clean pajamas.            11.  Place clean sheets on your bed the night of your first shower and do not  sleep with pets. Day of Surgery : Do not apply any lotions/deodorants the morning of surgery.  Please wear clean clothes to the hospital/surgery center.  FAILURE TO FOLLOW THESE INSTRUCTIONS MAY RESULT IN THE CANCELLATION OF YOUR SURGERY PATIENT SIGNATURE_________________________________  NURSE SIGNATURE__________________________________  ________________________________________________________________________    CLEAR LIQUID DIET   Foods Allowed                                                                     Foods  Excluded  Coffee and tea, regular and decaf                             liquids that you cannot  Plain Jell-O in any flavor                                             see through such as: Fruit ices (not with fruit pulp)                                     milk, soups, orange juice  Iced Popsicles                                    All solid food Carbonated beverages, regular and diet                                    Cranberry, grape and apple juices Sports drinks like Gatorade Lightly seasoned clear broth or consume(fat free) Sugar, honey syrup  Sample Menu Breakfast                                Lunch                                     Supper Cranberry juice                    Beef broth                            Chicken broth Jell-O                                     Grape juice                           Apple juice Coffee or tea                        Jell-O                                      Popsicle                                                Coffee or tea                        Coffee or tea  _____________________________________________________________________

## 2016-01-07 NOTE — Pre-Procedure Instructions (Signed)
CBC/diff 01-03-16 epic

## 2016-01-12 ENCOUNTER — Other Ambulatory Visit: Payer: Self-pay | Admitting: *Deleted

## 2016-01-13 ENCOUNTER — Other Ambulatory Visit: Payer: Self-pay | Admitting: *Deleted

## 2016-01-13 ENCOUNTER — Other Ambulatory Visit: Payer: Self-pay | Admitting: Physician Assistant

## 2016-01-13 MED ORDER — ACETAMINOPHEN-CODEINE #3 300-30 MG PO TABS: 1.0000 | ORAL_TABLET | Freq: Four times a day (QID) | ORAL | 0 refills | Status: DC | PRN

## 2016-01-13 NOTE — Telephone Encounter (Signed)
Please see rx refill request  °

## 2016-01-14 ENCOUNTER — Ambulatory Visit: Payer: BLUE CROSS/BLUE SHIELD | Admitting: Gastroenterology

## 2016-01-28 ENCOUNTER — Encounter (HOSPITAL_COMMUNITY): Payer: Self-pay | Admitting: *Deleted

## 2016-01-28 ENCOUNTER — Encounter (HOSPITAL_COMMUNITY)
Admission: RE | Disposition: A | Discharge status: Home / Self Care | Payer: Self-pay | Source: Ambulatory Visit | Attending: General Surgery

## 2016-01-28 ENCOUNTER — Inpatient Hospital Stay (HOSPITAL_COMMUNITY): Payer: BLUE CROSS/BLUE SHIELD | Admitting: Certified Registered"

## 2016-01-28 ENCOUNTER — Observation Stay (HOSPITAL_COMMUNITY)
Admission: RE | Admit: 2016-01-28 | Discharge: 2016-01-30 | DRG: 328 | Disposition: A | Discharge status: Home / Self Care | Payer: BLUE CROSS/BLUE SHIELD | Source: Ambulatory Visit | Attending: General Surgery | Admitting: General Surgery

## 2016-01-28 DIAGNOSIS — I10 Essential (primary) hypertension: Secondary | ICD-10-CM | POA: Insufficient documentation

## 2016-01-28 DIAGNOSIS — Z79899 Other long term (current) drug therapy: Secondary | ICD-10-CM | POA: Insufficient documentation

## 2016-01-28 DIAGNOSIS — R1310 Dysphagia, unspecified: Secondary | ICD-10-CM | POA: Diagnosis not present

## 2016-01-28 DIAGNOSIS — R112 Nausea with vomiting, unspecified: Secondary | ICD-10-CM | POA: Diagnosis not present

## 2016-01-28 DIAGNOSIS — Z931 Gastrostomy status: Secondary | ICD-10-CM

## 2016-01-28 DIAGNOSIS — K449 Diaphragmatic hernia without obstruction or gangrene: Secondary | ICD-10-CM | POA: Diagnosis not present

## 2016-01-28 DIAGNOSIS — Z791 Long term (current) use of non-steroidal anti-inflammatories (NSAID): Secondary | ICD-10-CM | POA: Diagnosis not present

## 2016-01-28 HISTORY — PX: INSERTION OF MESH: SHX5868

## 2016-01-28 SURGERY — FUNDOPLICATION, NISSEN, ROBOT-ASSISTED, LAPAROSCOPIC
Anesthesia: General | Site: Abdomen

## 2016-01-28 MED ORDER — LACTATED RINGERS IR SOLN
Status: DC | PRN
  Administered 2016-01-28: 1000 mL

## 2016-01-28 MED ORDER — FENTANYL CITRATE (PF) 100 MCG/2ML IJ SOLN
INTRAMUSCULAR | Status: AC
  Filled 2016-01-28: qty 2

## 2016-01-28 MED ORDER — ALBUTEROL SULFATE (2.5 MG/3ML) 0.083% IN NEBU: 3.0000 mL | INHALATION_SOLUTION | RESPIRATORY_TRACT | Status: DC | PRN

## 2016-01-28 MED ORDER — 0.9 % SODIUM CHLORIDE (POUR BTL) OPTIME
TOPICAL | Status: DC | PRN
  Administered 2016-01-28: 1000 mL

## 2016-01-28 MED ORDER — ENOXAPARIN SODIUM 40 MG/0.4ML ~~LOC~~ SOLN
40.0000 mg | SUBCUTANEOUS | Status: DC
  Administered 2016-01-29 – 2016-01-30 (×2): 40 mg via SUBCUTANEOUS
  Filled 2016-01-28 (×2): qty 0.4

## 2016-01-28 MED ORDER — HYDROMORPHONE HCL 1 MG/ML IJ SOLN
1.0000 mg | INTRAMUSCULAR | Status: DC | PRN
  Administered 2016-01-28 (×2): 1 mg via INTRAVENOUS
  Filled 2016-01-28 (×3): qty 1

## 2016-01-28 MED ORDER — DEXAMETHASONE SODIUM PHOSPHATE 10 MG/ML IJ SOLN
INTRAMUSCULAR | Status: AC
  Filled 2016-01-28: qty 1

## 2016-01-28 MED ORDER — SUGAMMADEX SODIUM 200 MG/2ML IV SOLN
INTRAVENOUS | Status: AC
  Filled 2016-01-28: qty 2

## 2016-01-28 MED ORDER — DEXTROSE-NACL 5-0.9 % IV SOLN
INTRAVENOUS | Status: DC
  Administered 2016-01-28 – 2016-01-30 (×4): via INTRAVENOUS

## 2016-01-28 MED ORDER — PROPOFOL 10 MG/ML IV BOLUS
INTRAVENOUS | Status: AC
  Filled 2016-01-28: qty 20

## 2016-01-28 MED ORDER — MEPERIDINE HCL 50 MG/ML IJ SOLN: 6.2500 mg | INTRAMUSCULAR | Status: DC | PRN

## 2016-01-28 MED ORDER — CEFAZOLIN SODIUM-DEXTROSE 2-4 GM/100ML-% IV SOLN
2.0000 g | INTRAVENOUS | Status: AC
  Administered 2016-01-28: 2 g via INTRAVENOUS

## 2016-01-28 MED ORDER — SUCCINYLCHOLINE CHLORIDE 200 MG/10ML IV SOSY
PREFILLED_SYRINGE | INTRAVENOUS | Status: DC | PRN
  Administered 2016-01-28: 100 mg via INTRAVENOUS

## 2016-01-28 MED ORDER — ARTIFICIAL TEARS OP OINT
TOPICAL_OINTMENT | OPHTHALMIC | Status: AC
  Filled 2016-01-28: qty 3.5

## 2016-01-28 MED ORDER — FENTANYL CITRATE (PF) 100 MCG/2ML IJ SOLN
25.0000 ug | INTRAMUSCULAR | Status: DC | PRN
  Administered 2016-01-28 (×3): 25 ug via INTRAVENOUS

## 2016-01-28 MED ORDER — HYDRALAZINE HCL 20 MG/ML IJ SOLN: 10.0000 mg | INTRAMUSCULAR | Status: DC | PRN

## 2016-01-28 MED ORDER — FENTANYL CITRATE (PF) 100 MCG/2ML IJ SOLN
INTRAMUSCULAR | Status: DC | PRN
  Administered 2016-01-28: 100 ug via INTRAVENOUS
  Administered 2016-01-28 (×4): 50 ug via INTRAVENOUS

## 2016-01-28 MED ORDER — METOCLOPRAMIDE HCL 5 MG/ML IJ SOLN: 10.0000 mg | Freq: Once | INTRAMUSCULAR | Status: DC | PRN

## 2016-01-28 MED ORDER — SUGAMMADEX SODIUM 200 MG/2ML IV SOLN
INTRAVENOUS | Status: DC | PRN
  Administered 2016-01-28: 200 mg via INTRAVENOUS

## 2016-01-28 MED ORDER — MIDAZOLAM HCL 5 MG/5ML IJ SOLN
INTRAMUSCULAR | Status: DC | PRN
  Administered 2016-01-28: 2 mg via INTRAVENOUS

## 2016-01-28 MED ORDER — LIDOCAINE 2% (20 MG/ML) 5 ML SYRINGE
INTRAMUSCULAR | Status: AC
  Filled 2016-01-28: qty 5

## 2016-01-28 MED ORDER — MIDAZOLAM HCL 2 MG/2ML IJ SOLN
INTRAMUSCULAR | Status: AC
  Filled 2016-01-28: qty 2

## 2016-01-28 MED ORDER — PROPOFOL 10 MG/ML IV BOLUS
INTRAVENOUS | Status: DC | PRN
  Administered 2016-01-28: 150 mg via INTRAVENOUS

## 2016-01-28 MED ORDER — CEFAZOLIN SODIUM-DEXTROSE 2-4 GM/100ML-% IV SOLN
INTRAVENOUS | Status: AC
  Filled 2016-01-28: qty 100

## 2016-01-28 MED ORDER — DEXAMETHASONE SODIUM PHOSPHATE 10 MG/ML IJ SOLN
INTRAMUSCULAR | Status: DC | PRN
  Administered 2016-01-28: 10 mg via INTRAVENOUS

## 2016-01-28 MED ORDER — FENTANYL CITRATE (PF) 100 MCG/2ML IJ SOLN
INTRAMUSCULAR | Status: AC
  Filled 2016-01-28: qty 4

## 2016-01-28 MED ORDER — LIDOCAINE 2% (20 MG/ML) 5 ML SYRINGE
INTRAMUSCULAR | Status: DC | PRN
  Administered 2016-01-28: 40 mg via INTRAVENOUS

## 2016-01-28 MED ORDER — LACTATED RINGERS IV SOLN: INTRAVENOUS | Status: DC

## 2016-01-28 MED ORDER — ONDANSETRON HCL 4 MG/2ML IJ SOLN
4.0000 mg | Freq: Four times a day (QID) | INTRAMUSCULAR | Status: DC | PRN
  Administered 2016-01-28 – 2016-01-29 (×4): 4 mg via INTRAVENOUS
  Filled 2016-01-28 (×4): qty 2

## 2016-01-28 MED ORDER — ROCURONIUM BROMIDE 10 MG/ML (PF) SYRINGE
PREFILLED_SYRINGE | INTRAVENOUS | Status: DC | PRN
  Administered 2016-01-28 (×2): 10 mg via INTRAVENOUS
  Administered 2016-01-28: 50 mg via INTRAVENOUS

## 2016-01-28 MED ORDER — ROCURONIUM BROMIDE 100 MG/10ML IV SOLN
INTRAVENOUS | Status: AC
  Filled 2016-01-28: qty 1

## 2016-01-28 MED ORDER — ONDANSETRON 4 MG PO TBDP
4.0000 mg | ORAL_TABLET | Freq: Four times a day (QID) | ORAL | Status: DC | PRN
  Administered 2016-01-30: 4 mg via ORAL
  Filled 2016-01-28: qty 1

## 2016-01-28 MED ORDER — ACETAMINOPHEN 10 MG/ML IV SOLN
1000.0000 mg | Freq: Four times a day (QID) | INTRAVENOUS | Status: AC
  Administered 2016-01-28: 1000 mg via INTRAVENOUS
  Filled 2016-01-28 (×3): qty 100

## 2016-01-28 MED ORDER — LACTATED RINGERS IV SOLN
INTRAVENOUS | Status: AC
  Administered 2016-01-28: 15:00:00 via INTRAVENOUS
  Administered 2016-01-28: 1000 mL via INTRAVENOUS

## 2016-01-28 MED ORDER — ONDANSETRON HCL 4 MG/2ML IJ SOLN
INTRAMUSCULAR | Status: AC
  Filled 2016-01-28: qty 2

## 2016-01-28 MED ORDER — CHLORHEXIDINE GLUCONATE CLOTH 2 % EX PADS: 6.0000 | MEDICATED_PAD | Freq: Once | CUTANEOUS | Status: DC

## 2016-01-28 MED ORDER — BUPIVACAINE-EPINEPHRINE (PF) 0.5% -1:200000 IJ SOLN
INTRAMUSCULAR | Status: AC
  Filled 2016-01-28: qty 30

## 2016-01-28 MED ORDER — BUPIVACAINE-EPINEPHRINE (PF) 0.5% -1:200000 IJ SOLN
INTRAMUSCULAR | Status: DC | PRN
  Administered 2016-01-28: 30 mL

## 2016-01-28 SURGICAL SUPPLY — 57 items
APPLIER CLIP 5 13 M/L LIGAMAX5 (MISCELLANEOUS)
APPLIER CLIP ROT 10 11.4 M/L (STAPLE)
BLADE SURG SZ11 CARB STEEL (BLADE) ×2 IMPLANT
CHLORAPREP W/TINT 26ML (MISCELLANEOUS) ×2 IMPLANT
CLIP APPLIE 5 13 M/L LIGAMAX5 (MISCELLANEOUS) IMPLANT
CLIP APPLIE ROT 10 11.4 M/L (STAPLE) IMPLANT
CLIP LIGATING HEM O LOK PURPLE (MISCELLANEOUS) IMPLANT
CLIP LIGATING HEMO O LOK GREEN (MISCELLANEOUS) IMPLANT
COVER TIP SHEARS 8 DVNC (MISCELLANEOUS) IMPLANT
COVER TIP SHEARS 8MM DA VINCI (MISCELLANEOUS)
DECANTER SPIKE VIAL GLASS SM (MISCELLANEOUS) ×2 IMPLANT
DEVICE TROCAR PUNCTURE CLOSURE (ENDOMECHANICALS) IMPLANT
DRAIN PENROSE 18X1/2 LTX STRL (DRAIN) ×2 IMPLANT
DRAPE ARM DVNC X/XI (DISPOSABLE) ×4 IMPLANT
DRAPE COLUMN DVNC XI (DISPOSABLE) ×1 IMPLANT
DRAPE DA VINCI XI ARM (DISPOSABLE) ×4
DRAPE DA VINCI XI COLUMN (DISPOSABLE) ×1
DRAPE SHEET LG 3/4 BI-LAMINATE (DRAPES) IMPLANT
ELECT REM PT RETURN 9FT ADLT (ELECTROSURGICAL) ×2
ELECTRODE REM PT RTRN 9FT ADLT (ELECTROSURGICAL) ×1 IMPLANT
ENDOLOOP SUT PDS II  0 18 (SUTURE)
ENDOLOOP SUT PDS II 0 18 (SUTURE) IMPLANT
GAUZE SPONGE 4X4 16PLY XRAY LF (GAUZE/BANDAGES/DRESSINGS) ×2 IMPLANT
GLOVE BIO SURGEON STRL SZ7.5 (GLOVE) ×4 IMPLANT
GOWN STRL REUS W/TWL XL LVL3 (GOWN DISPOSABLE) ×8 IMPLANT
IRRIG SUCT STRYKERFLOW 2 WTIP (MISCELLANEOUS) ×2
IRRIGATION SUCT STRKRFLW 2 WTP (MISCELLANEOUS) ×1 IMPLANT
KIT BASIN OR (CUSTOM PROCEDURE TRAY) ×2 IMPLANT
LIQUID BAND (GAUZE/BANDAGES/DRESSINGS) ×2 IMPLANT
MARKER SKIN DUAL TIP RULER LAB (MISCELLANEOUS) ×2 IMPLANT
MESH HERNIA 7X10 (Mesh General) ×2 IMPLANT
NEEDLE HYPO 22GX1.5 SAFETY (NEEDLE) ×2 IMPLANT
NEEDLE INSUFFLATION 14GA 120MM (NEEDLE) ×2 IMPLANT
PACK CARDIOVASCULAR III (CUSTOM PROCEDURE TRAY) ×2 IMPLANT
PAD POSITIONING PINK XL (MISCELLANEOUS) ×2 IMPLANT
SCISSORS LAP 5X35 DISP (ENDOMECHANICALS) ×2 IMPLANT
SEAL CANN UNIV 5-8 DVNC XI (MISCELLANEOUS) ×4 IMPLANT
SEAL XI 5MM-8MM UNIVERSAL (MISCELLANEOUS) ×4
SEALER VESSEL DA VINCI XI (MISCELLANEOUS) ×1
SEALER VESSEL EXT DVNC XI (MISCELLANEOUS) ×1 IMPLANT
SET BI-LUMEN FLTR TB AIRSEAL (TUBING) ×2 IMPLANT
SLEEVE XCEL OPT CAN 5 100 (ENDOMECHANICALS) IMPLANT
SOLUTION ANTI FOG 6CC (MISCELLANEOUS) ×2 IMPLANT
SOLUTION ELECTROLUBE (MISCELLANEOUS) ×2 IMPLANT
STAPLER VISISTAT 35W (STAPLE) IMPLANT
SUT ETHIBOND 0 36 GRN (SUTURE) ×4 IMPLANT
SUT MNCRL AB 4-0 PS2 18 (SUTURE) ×2 IMPLANT
SUT SILK 0 SH 30 (SUTURE) ×14 IMPLANT
SUT SILK 2 0 SH (SUTURE) ×4 IMPLANT
SYR 20CC LL (SYRINGE) ×2 IMPLANT
SYRINGE 10CC LL (SYRINGE) ×2 IMPLANT
TOWEL OR 17X26 10 PK STRL BLUE (TOWEL DISPOSABLE) ×2 IMPLANT
TOWEL OR NON WOVEN STRL DISP B (DISPOSABLE) ×2 IMPLANT
TRAY FOLEY W/METER SILVER 16FR (SET/KITS/TRAYS/PACK) IMPLANT
TROCAR ADV FIXATION 5X100MM (TROCAR) ×2 IMPLANT
TUBING CONNECTING 10 (TUBING) ×2 IMPLANT
TUBING ENDO SMARTCAP PENTAX (MISCELLANEOUS) ×2 IMPLANT

## 2016-01-28 NOTE — Progress Notes (Signed)
No repeat  pregnancy test needed per dr. Karma Ganjacarginan.

## 2016-01-28 NOTE — Op Note (Signed)
01/28/2016  3:16 PM  PATIENT:  Darlene [name]  45 y.o. female  PRE-OPERATIVE DIAGNOSIS:  hiatal hernia  POST-OPERATIVE DIAGNOSIS:  hiatal hernia  PROCEDURE:  Procedure(s): XI ROBOTIC HIATAL HERNIA REPAIR WITH MESH AND  NISSEN FUNDOPLICATION (N/A) INSERTION OF MESH (N/A)  SURGEON:  Surgeon(s) and Role:    * Romie Levee, MD - Assisting    * Axel Filler, MD - Primary  ANESTHESIA:   local and general  EBL:20cc   Total I/O In: 1000 [I.V.:1000] Out: 100 [Urine:100]  BLOOD ADMINISTERED:none  DRAINS: none   LOCAL MEDICATIONS USED:  BUPIVICAINE   SPECIMEN:  No Specimen  DISPOSITION OF SPECIMEN:  N/A  COUNTS:  YES  TOURNIQUET:  * No tourniquets in log *  DICTATION: .Dragon Dictation The patient was taken back to the operating room and placed in the supine position with bilateral SCDs in place. The patient was prepped and draped in the usual sterile fashion. After appropriate antibiotics were confirmed a timeout was called and all facts were verified.   A Veress needle technique was used to insufflate the abdomen to 15 mm of mercury the paramedian stab incision. Subsequent to this an 8 mm trocar was introduced as was a 8 millimeter camera. At this time the subsequent robotic trochars x3, were then placed adjacent to this trocar approximately 8-10 cm away. Each trocar was inserted under direct visualization, there were total of 4 trochars. The assistant trocar was then placed in the right lower quadrant under direct visualization. The Nathanson retractor was then visualized inserted into the abdomen and the incision just to the left of the falciform ligament. This was then placed to retract the liver appropriately. At this time the patient was positioned in reverse Trendelenburg.   At this time the robot patient cart was brought to the bedside and placed in good position and the arms were docked to the trochars appropriately.   I proceeded to reduced the large amount of stomach  that was within the chest.  Once reduced the diaphragmatic defect appeared small approximately  2.5cm.  At this time I proceeded to incised the gastrohepatic ligament.  At this time I proceeded to mobilize the stomach inferiorly and visualize the right crus. The peritoneum over the right crus was incised and right crus was identified. I proceeded to dissect this inferiorly until the left crus was seen joining the right crus. Once the right crus was adequately dissected we turned our to the left crus which was dissected away. This required traction of the stomach to the right side. Once this was visualized we then proceeded to circumferentially dissect the esophagus away from the surrounding tissue. At this time a Penrose drain was placed around the esophagus to help with retraction. At this time the phrenoesophageal fat pad was dissected away from the esophagus. There was a 2.5cm hiatal hernia seen. I mobilized the esophagus cephalad approximately 3-4 cm, clearing away the surrounding tissue. It should be noted the esophagus was immediately anterior to the aorta.  This mobilization was done very meticulously.  At this time we turned our attention to the greater curvature the stomach and the omentum was mobilized using the robotic vessel sealer. This was taken up to the greater curvature to the hiatus. This mobilized the entire greater curvature to allow mobilization and the wrap. I then proceeded to bring the greater curvature the stomach posterior to the esophagus, and a shoeshine technique was used to evaluate the mobilization of the greater curvature.   At this  time I proceeded to close the hiatus using 2 interrupted  0 ethibond  stitches in interrupted fashion. This brought together the hiatal closure without undue stricture to the esophagus.   A piece of BIO-A u-shaped mesh was placed over the hiatal closure and sutured to the crus using 0 silk sutures x 2, and to the anterior corners of the mesh x  2..  At this time the greater curvature was brought around the esophagus and sutured using 0 silk sutures interrupted fashion approximately 1 cm apart x3. The middle suture was sutured to the esophagus. A left collar stitch was then used to gastropexy the stomach from the wrap to the diaphragm just lateral to the left crus as.  A second collar stitch was placed from the wrap to the right crus. The wrap lay at approximately 11:00 on its own with undue tension.  The wrap lay loose with no strangulation of the esophagus.  At this time the robot was undocked. The liver trocar was removed. At this time insufflation was evacuated. Skin was reapproximated for Monocryl subcuticular fashion. The skin was then dressed with Dermabond. The patient tolerated the procedure well and was taken to the recovery room in stable condition.  It should be noted Dr. Maisie Fushomas was a vital participant in helping with retraction intrabdominally, as well as helping pass sutures, penrose, and removing the above from the abdominal cavity while I was at the robotic console.   PLAN OF CARE: Admit for overnight observation  PATIENT DISPOSITION:  PACU - hemodynamically stable.   Delay start of Pharmacological VTE agent (>24hrs) due to surgical blood loss or risk of bleeding: not applicable

## 2016-01-28 NOTE — Anesthesia Postprocedure Evaluation (Signed)
Anesthesia Post Note  Patient: Sales promotion account executiveCandy [name]  Procedure(s) Performed: Procedure(s) (LRB): XI ROBOTIC HIATAL HERNIA REPAIR WITH MESH AND  NISSEN FUNDOPLICATION (N/A) INSERTION OF MESH (N/A)  Patient location during evaluation: PACU Anesthesia Type: General Level of consciousness: awake and alert Pain management: pain level controlled Vital Signs Assessment: post-procedure vital signs reviewed and stable Respiratory status: spontaneous breathing, nonlabored ventilation, respiratory function stable and patient connected to nasal cannula oxygen Cardiovascular status: blood pressure returned to baseline and stable Postop Assessment: no signs of nausea or vomiting Anesthetic complications: no    Last Vitals:  Vitals:   01/28/16 1646 01/28/16 1659  BP: 123/71 126/79  Pulse: 94 93  Resp: (!) 23 18  Temp: 36.8 C 36.7 C    Last Pain:  Vitals:   01/28/16 1630  TempSrc:   PainSc: 0-No pain                 Phillips Groutarignan, Rogan Wigley

## 2016-01-28 NOTE — H&P (Signed)
History of Present Illness  Patient words: hernia.  The patient is a 45 year old female who presents with a hiatal hernia. 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 Patient underwent Gastrografin esophagogram which reveals a large paraesophageal hernia Patient underwent gastric emptying study which reveals normal emptying of the stomach.  Patient states continues with pain, nausea, vomiting after meals, and shortness of breath as she squats. ________________________________________________________________ Patient is a 45 year old female who is referred by Dr. Adela LankArmbruster for evaluation of hiatal hernia. Patient states that she's had some dysphagia. She states that she feels food gets stuck in her chest. She states that she gets full very quickly. Patient's been avoiding things like bread and meat and bread disease of been causing this to worsen. She does state that she's had some emesis. She does not endorse any reflux. States that she is short of breath when she squats down.  Patient underwent a CT scan which reveals a moderate size hiatal hernia. Patient also underwent EGD which confirms the same.   Allergies No Known Drug Allergies06/30/2017  Medication History ProAir HFA (108 (90 Base)MCG/ACT Aerosol Soln, Inhalation) Active. Lisinopril (5MG  Tablet, Oral) Active. Klor-Con M20 (20MEQ Tablet ER, Oral) Active. Vitamin D (Ergocalciferol) (50000UNIT Capsule, Oral twice weekly) Active. Loratadine (10MG  Tablet, Oral) Active. Esomeprazole Magnesium (40MG  Capsule DR, Oral) Active. HydroCHLOROthiazide (25MG  Tablet, Oral) Active. Ibuprofen (800MG  Tablet, Oral) Active. Acetaminophen-Codeine #3 (300-30MG  Tablet, Oral) Active. Medications Reconciled    Review of Systems  General Present- Appetite Loss and Weight Loss. Not Present- Chills, Fatigue, Fever, Night Sweats and Weight Gain. Skin Not Present- Change in Wart/Mole, Dryness,  Hives, Jaundice, New Lesions, Non-Healing Wounds, Rash and Ulcer. HEENT Not Present- Earache, Hearing Loss, Hoarseness, Nose Bleed, Oral Ulcers, Ringing in the Ears, Seasonal Allergies, Sinus Pain, Sore Throat, Visual Disturbances, Wears glasses/contact lenses and Yellow Eyes. Respiratory Present- Difficulty Breathing. Not Present- Bloody sputum, Chronic Cough, Snoring and Wheezing. Breast Not Present- Breast Mass, Breast Pain, Nipple Discharge and Skin Changes. Cardiovascular Present- Shortness of Breath. Not Present- Chest Pain, Difficulty Breathing Lying Down, Leg Cramps, Palpitations, Rapid Heart Rate and Swelling of Extremities. Gastrointestinal Present- Abdominal Pain, Nausea and Vomiting. Not Present- Bloating, Bloody Stool, Change in Bowel Habits, Chronic diarrhea, Constipation, Difficulty Swallowing, Excessive gas, Gets full quickly at meals, Hemorrhoids, Indigestion and Rectal Pain. Female Genitourinary Not Present- Frequency, Nocturia, Painful Urination, Pelvic Pain and Urgency. Musculoskeletal Not Present- Back Pain, Joint Pain, Joint Stiffness, Muscle Pain, Muscle Weakness and Swelling of Extremities. Neurological Not Present- Decreased Memory, Fainting, Headaches, Numbness, Seizures, Tingling, Tremor, Trouble walking and Weakness. Psychiatric Not Present- Anxiety, Bipolar, Change in Sleep Pattern, Depression, Fearful and Frequent crying. Endocrine Not Present- Cold Intolerance, Excessive Hunger, Hair Changes, Heat Intolerance, Hot flashes and New Diabetes. Hematology Not Present- Blood Thinners, Easy Bruising, Excessive bleeding, Gland problems, HIV and Persistent Infections.  BP 119/78   Pulse 78   Temp 98.7 F (37.1 C) (Oral)   Resp 16   Ht 5' 2.5" (1.588 m)   Wt 85.3 kg (188 lb)   LMP 01/27/2016   SpO2 100%   BMI 33.84 kg/m    Physical Exam  General Mental Status-Alert. General Appearance-Consistent with stated age. Hydration-Well  hydrated. Voice-Normal.  Head and Neck Head-normocephalic, atraumatic with no lesions or palpable masses. Trachea-midline. Thyroid Gland Characteristics - normal size and consistency.  Eye Eyeball - Bilateral-Extraocular movements intact. Sclera/Conjunctiva - Bilateral-No scleral icterus.  Chest and Lung Exam Chest and lung exam reveals -quiet, even and easy respiratory  effort with no use of accessory muscles and on auscultation, normal breath sounds, no adventitious sounds and normal vocal resonance. Inspection Chest Wall - Normal. Back - normal.  Breast Breast - Left-Symmetric, Non Tender, No Biopsy scars, no Dimpling, No Inflammation, No Lumpectomy scars, No Mastectomy scars, No Peau d' Orange. Breast - Right-Symmetric, Non Tender, No Biopsy scars, no Dimpling, No Inflammation, No Lumpectomy scars, No Mastectomy scars, No Peau d' Orange. Breast Lump-No Palpable Breast Mass.  Cardiovascular Cardiovascular examination reveals -normal heart sounds, regular rate and rhythm with no murmurs and normal pedal pulses bilaterally.  Abdomen Inspection Normal Exam - No Hernias. Skin - Scar - no surgical scars. Palpation/Percussion Normal exam - Soft, Non Tender, No Rebound tenderness, No Rigidity (guarding) and No hepatosplenomegaly. Auscultation Normal exam - Bowel sounds normal.  Neurologic Neurologic evaluation reveals -alert and oriented x 3 with no impairment of recent or remote memory. Mental Status-Normal.  Musculoskeletal Normal Exam - Left-Upper Extremity Strength Normal and Lower Extremity Strength Normal. Normal Exam - Right-Upper Extremity Strength Normal and Lower Extremity Strength Normal.  Lymphatic Head & Neck General Head & Neck Lymphatics: Bilateral - Description - Normal. Axillary General Axillary Region: Bilateral - Description - Normal. Tenderness - Non Tender. Femoral & Inguinal Generalized Femoral & Inguinal  Lymphatics: Bilateral - Description - Normal. Tenderness - Non Tender.    Assessment & Plan  HIATAL HERNIA (K44.9) Impression: Patient is a 45 year old female with a moderate size hiatal hernia, symptomatic  1. Will proceed to the operating room for a robotic had a hernia repair with mesh, and Nissen fundoplication. 2. Discussed with the patient the risks and benefits to the procedure to include but not limited to: Infection, bleeding, damage to structures, possible pneumothorax, possible recurrence. Patient was understanding and wished to proceed.

## 2016-01-28 NOTE — Anesthesia Preprocedure Evaluation (Signed)
Anesthesia Evaluation  Patient identified by MRN, date of birth, ID band Patient awake    Reviewed: Allergy & Precautions, NPO status , Patient's Chart, lab work & pertinent test results  Airway Mallampati: II  TM Distance: >3 FB Neck ROM: Full    Dental no notable dental hx.    Pulmonary neg pulmonary ROS,    Pulmonary exam normal breath sounds clear to auscultation       Cardiovascular hypertension, negative cardio ROS Normal cardiovascular exam Rhythm:Regular Rate:Normal     Neuro/Psych negative neurological ROS  negative psych ROS   GI/Hepatic Neg liver ROS, hiatal hernia,   Endo/Other  negative endocrine ROS  Renal/GU negative Renal ROS  negative genitourinary   Musculoskeletal negative musculoskeletal ROS (+)   Abdominal   Peds negative pediatric ROS (+)  Hematology negative hematology ROS (+)   Anesthesia Other Findings   Reproductive/Obstetrics negative OB ROS                             Anesthesia Physical Anesthesia Plan  ASA: II  Anesthesia Plan: General   Post-op Pain Management:    Induction: Intravenous, Rapid sequence and Cricoid pressure planned  Airway Management Planned: Oral ETT  Additional Equipment:   Intra-op Plan:   Post-operative Plan: Extubation in OR  Informed Consent: I have reviewed the patients History and Physical, chart, labs and discussed the procedure including the risks, benefits and alternatives for the proposed anesthesia with the patient or authorized representative who has indicated his/her understanding and acceptance.   Dental advisory given  Plan Discussed with: CRNA  Anesthesia Plan Comments:         Anesthesia Quick Evaluation

## 2016-01-28 NOTE — Transfer of Care (Signed)
Immediate Anesthesia Transfer of Care Note  Patient: Darlene [name]  Procedure(s) Performed: Procedure(s): XI ROBOTIC HIATAL HERNIA REPAIR WITH MESH AND  NISSEN FUNDOPLICATION (N/A) INSERTION OF MESH (N/A)  Patient Location: PACU  Anesthesia Type:General  Level of Consciousness:  sedated, patient cooperative and responds to stimulation  Airway & Oxygen Therapy:Patient Spontanous Breathing and Patient connected to face mask oxgen  Post-op Assessment:  Report given to PACU RN and Post -op Vital signs reviewed and stable  Post vital signs:  Reviewed and stable  Last Vitals:  Vitals:   01/28/16 1046  BP: 119/78  Pulse: 78  Resp: 16  Temp: 37.1 C    Complications: No apparent anesthesia complications

## 2016-01-28 NOTE — Anesthesia Procedure Notes (Signed)
Procedure Name: Intubation Date/Time: 01/28/2016 12:45 PM Performed by: Early OsmondEARGLE, Mica Ramdass E Pre-anesthesia Checklist: Patient identified, Emergency Drugs available, Suction available and Patient being monitored Patient Re-evaluated:Patient Re-evaluated prior to inductionOxygen Delivery Method: Circle system utilized Preoxygenation: Pre-oxygenation with 100% oxygen Intubation Type: IV induction, Rapid sequence and Cricoid Pressure applied Laryngoscope Size: Miller and 3 Grade View: Grade I Tube type: Oral Tube size: 7.0 mm Number of attempts: 1 Airway Equipment and Method: Stylet and Oral airway Placement Confirmation: ETT inserted through vocal cords under direct vision,  positive ETCO2 and breath sounds checked- equal and bilateral Secured at: 21 cm Tube secured with: Tape Dental Injury: Teeth and Oropharynx as per pre-operative assessment

## 2016-01-28 NOTE — Discharge Instructions (Signed)
EATING AFTER YOUR ESOPHAGEAL SURGERY °(Stomach Fundoplication, Hiatal Hernia repair, Achalasia surgery, etc) ° °###################################################################### ° °EAT °Start with a pureed / full liquid diet (see below) °Gradually transition to a high fiber diet with a fiber supplement over the next month after discharge.   ° °WALK °Walk an hour a day.  Control your pain to do that.   ° °CONTROL PAIN °Control pain so that you can walk, sleep, tolerate sneezing/coughing, go up/down stairs. ° °HAVE A BOWEL MOVEMENT DAILY °Keep your bowels regular to avoid problems.  OK to try a laxative to override constipation.  OK to use an antidairrheal to slow down diarrhea.  Call if not better after 2 tries ° °CALL IF YOU HAVE PROBLEMS/CONCERNS °Call if you are still struggling despite following these instructions. °Call if you have concerns not answered by these instructions ° °###################################################################### ° ° °After your esophageal surgery, expect some sticking with swallowing over the next 1-2 months.   ° °If food sticks when you eat, it is called "dysphagia".  This is due to swelling around your esophagus at the wrap & hiatal diaphragm repair.  It will gradually ease off over the next few months.  To help you through this temporary phase, we start you out on a pureed (blenderized) diet. ° °Your first meal in the hospital was thin liquids.  You should have been given a pureed diet by the time you left the hospital.  We ask patients to stay on a pureed diet for the first 2-3 weeks to avoid anything getting "stuck" near your recent surgery.  Don't be alarmed if your ability to swallow doesn't progress according to this plan.  Everyone is different and some diets can advance more or less quickly.   ° ° °Some BASIC RULES to follow are: °· Maintain an upright position whenever eating or drinking. °· Take small bites - just a teaspoon size bite at a time. °· Eat slowly.   It may also help to eat only one food at a time. °· Consider nibbling through smaller, more frequent meals & avoid the urge to eat BIG meals °· Do not push through feelings of fullness, nausea, or bloatedness °· Do not mix solid foods and liquids in the same mouthful °· Try not to "wash foods down" with large gulps of liquids. °· Avoid carbonated (bubbly/fizzy) drinks.   °· Avoid foods that make you feel gassy or bloated.  Start with bland foods first.  Wait on trying greasy, fried, or spicy meals until you are tolerating more bland solids well. °· Understand that it will be hard to burp and belch at first.  This gradually improves with time.  Expect to be more gassy/flatulent/bloated initially.  Walking will help your body manage it better. °· Consider using medications for bloating that contain simethicone such as  Maalox or Gas-X  °· Eat in a relaxed atmosphere & minimize distractions. °· Avoid talking while eating.   °· Do not use straws. °· Following each meal, sit in an upright position (90 degree angle) for 60 to 90 minutes.  Going for a short walk can help as well °· If food does stick, don't panic.  Try to relax and let the food pass on its own.  Sipping WARM LIQUID such as strong hot black tea can also help slide it down. ° ° °Be gradual in changes & use common sense: ° °-If you easily tolerating a certain "level" of foods, advance to the next level gradually °-If you are   having trouble swallowing a particular food, then avoid it.   °-If food is sticking when you advance your diet, go back to thinner previous diet (the lower LEVEL) for 1-2 days. ° °LEVEL 1 = PUREED DIET ° °Do for the first 2 WEEKS AFTER SURGERY ° °-Foods in this group are pureed or blenderized to a smooth, mashed potato-like consistency.  °-If necessary, the pureed foods can keep their shape with the addition of a thickening agent.   °-Meat should be pureed to a smooth, pasty consistency.  Hot broth or gravy may be added to the pureed  meat, approximately 1 oz. of liquid per 3 oz. serving of meat. °-CAUTION:  If any foods do not puree into a smooth consistency, swallowing will be more difficult.  (For example, nuts or seeds sometimes do not blend well.) ° °Hot Foods Cold Foods  °Pureed scrambled eggs and cheese Pureed cottage cheese  °Baby cereals Thickened juices and nectars  °Thinned cooked cereals (no lumps) Thickened milk or eggnog  °Pureed French toast or pancakes Ensure  °Mashed potatoes Ice cream  °Pureed parsley, au gratin, scalloped potatoes, candied sweet potatoes Fruit or Italian ice, sherbet  °Pureed buttered or alfredo noodles Plain yogurt  °Pureed vegetables (no corn or peas) Instant breakfast  °Pureed soups and creamed soups Smooth pudding, mousse, custard  °Pureed scalloped apples Whipped gelatin  °Gravies Sugar, syrup, honey, jelly  °Sauces, cheese, tomato, barbecue, white, creamed Cream  °Any baby food Creamer  °Alcohol in moderation (not beer or champagne) Margarine  °Coffee or tea Mayonnaise  ° Ketchup, mustard  ° Apple sauce  ° °SAMPLE MENU:  PUREED DIET °Breakfast Lunch Dinner  °· Orange juice, 1/2 cup °· Cream of wheat, 1/2 cup · Pineapple juice, 1/2 cup · Pureed turkey, barley soup, 3/4 cup °· Pureed Hawaiian chicken, 3 oz  °· Scrambled eggs, mashed or blended with cheese, 1/2 cup °· Tea or coffee, 1 cup  °· Whole milk, 1 cup  °· Non-dairy creamer, 2 Tbsp. · Mashed potatoes, 1/2 cup °· Pureed cooled broccoli, 1/2 cup °· Apple sauce, 1/2 cup °· Coffee or tea · Mashed potatoes, 1/2 cup °· Pureed spinach, 1/2 cup °· Frozen yogurt, 1/2 cup °· Tea or coffee  ° ° ° ° °LEVEL 2 = SOFT DIET ° °After your first 2 weeks, you can advance to a soft diet.   °Keep on this diet until everything goes down easily. ° °Hot Foods Cold Foods  °White fish Cottage cheese  °Stuffed fish Junior baby fruit  °Baby food meals Semi thickened juices  °Minced soft cooked, scrambled, poached eggs nectars  °Souffle & omelets Ripe mashed bananas  °Cooked  cereals Canned fruit, pineapple sauce, milk  °potatoes Milkshake  °Buttered or Alfredo noodles Custard  °Cooked cooled vegetable Puddings, including tapioca  °Sherbet Yogurt  °Vegetable soup or alphabet soup Fruit ice, Italian ice  °Gravies Whipped gelatin  °Sugar, syrup, honey, jelly Junior baby desserts  °Sauces:  Cheese, creamed, barbecue, tomato, white Cream  °Coffee or tea Margarine  ° °SAMPLE MENU:  LEVEL 2 °Breakfast Lunch Dinner  °· Orange juice, 1/2 cup °· Oatmeal, 1/2 cup °· Scrambled eggs with cheese, 1/2 cup °· Decaffeinated tea, 1 cup °· Whole milk, 1 cup °· Non-dairy creamer, 2 Tbsp · Pineapple juice, 1/2 cup °· Minced beef, 3 oz °· Gravy, 2 Tbsp °· Mashed potatoes, 1/2 cup °· Minced fresh broccoli, 1/2 cup °· Applesauce, 1/2 cup °· Coffee, 1 cup · Turkey, barley soup, 3/4 cup °·   Minced Hawaiian chicken, 3 oz °· Mashed potatoes, 1/2 cup °· Cooked spinach, 1/2 cup °· Frozen yogurt, 1/2 cup °· Non-dairy creamer, 2 Tbsp  ° ° ° ° °LEVEL 3 = CHOPPED DIET ° °-After all the foods in level 2 (soft diet) are passing through well you should advance up to more chopped foods.  °-It is still important to cut these foods into small pieces and eat slowly. ° °Hot Foods Cold Foods  °Poultry Cottage cheese  °Chopped Swedish meatballs Yogurt  °Meat salads (ground or flaked meat) Milk  °Flaked fish (tuna) Milkshakes  °Poached or scrambled eggs Soft, cold, dry cereal  °Souffles and omelets Fruit juices or nectars  °Cooked cereals Chopped canned fruit  °Chopped French toast or pancakes Canned fruit cocktail  °Noodles or pasta (no rice) Pudding, mousse, custard  °Cooked vegetables (no frozen peas, corn, or mixed vegetables) Green salad  °Canned small sweet peas Ice cream  °Creamed soup or vegetable soup Fruit ice, Italian ice  °Pureed vegetable soup or alphabet soup Non-dairy creamer  °Ground scalloped apples Margarine  °Gravies Mayonnaise  °Sauces:  Cheese, creamed, barbecue, tomato, white Ketchup  °Coffee or tea Mustard   ° °SAMPLE MENU:  LEVEL 3 °Breakfast Lunch Dinner  °· Orange juice, 1/2 cup °· Oatmeal, 1/2 cup °· Scrambled eggs with cheese, 1/2 cup °· Decaffeinated tea, 1 cup °· Whole milk, 1 cup °· Non-dairy creamer, 2 Tbsp °· Ketchup, 1 Tbsp °· Margarine, 1 tsp °· Salt, 1/4 tsp °· Sugar, 2 tsp · Pineapple juice, 1/2 cup °· Ground beef, 3 oz °· Gravy, 2 Tbsp °· Mashed potatoes, 1/2 cup °· Cooked spinach, 1/2 cup °· Applesauce, 1/2 cup °· Decaffeinated coffee °· Whole milk °· Non-dairy creamer, 2 Tbsp °· Margarine, 1 tsp °· Salt, 1/4 tsp · Pureed turkey, barley soup, 3/4 cup °· Barbecue chicken, 3 oz °· Mashed potatoes, 1/2 cup °· Ground fresh broccoli, 1/2 cup °· Frozen yogurt, 1/2 cup °· Decaffeinated tea, 1 cup °· Non-dairy creamer, 2 Tbsp °· Margarine, 1 tsp °· Salt, 1/4 tsp °· Sugar, 1 tsp  ° ° °LEVEL 4:  REGULAR FOODS ° °-Foods in this group are soft, moist, regularly textured foods.   °-This level includes meat and breads, which tend to be the hardest things to swallow.   °-Eat very slowly, chew well and continue to avoid carbonated drinks. °-most people are at this level in 4-6 weeks ° °Hot Foods Cold Foods  °Baked fish or skinned Soft cheeses - cottage cheese  °Souffles and omelets Cream cheese  °Eggs Yogurt  °Stuffed shells Milk  °Spaghetti with meat sauce Milkshakes  °Cooked cereal Cold dry cereals (no nuts, dried fruit, coconut)  °French toast or pancakes Crackers  °Buttered toast Fruit juices or nectars  °Noodles or pasta (no rice) Canned fruit  °Potatoes (all types) Ripe bananas  °Soft, cooked vegetables (no corn, lima, or baked beans) Peeled, ripe, fresh fruit  °Creamed soups or vegetable soup Cakes (no nuts, dried fruit, coconut)  °Canned chicken noodle soup Plain doughnuts  °Gravies Ice cream  °Bacon dressing Pudding, mousse, custard  °Sauces:  Cheese, creamed, barbecue, tomato, white Fruit ice, Italian ice, sherbet  °Decaffeinated tea or coffee Whipped gelatin  °Pork chops Regular gelatin  ° Canned fruited  gelatin molds  ° Sugar, syrup, honey, jam, jelly  ° Cream  ° Non-dairy  ° Margarine  ° Oil  ° Mayonnaise  ° Ketchup  ° Mustard  ° °TROUBLESHOOTING IRREGULAR BOWELS  °1) Avoid extremes of bowel   movements (no bad constipation/diarrhea)  °2) Miralax 17gm mixed in 8oz. water or juice-daily. May use BID as needed.  °3) Gas-x,Phazyme, etc. as needed for gas & bloating.  °4) Soft,bland diet. No spicy,greasy,fried foods.  °5) Prilosec over-the-counter as needed  °6) May hold gluten/wheat products from diet to see if symptoms improve.  °7) May try probiotics (Align, Activa, etc) to help calm the bowels down  °7) If symptoms become worse call back immediately. ° ° ° °If you have any questions please call our office at [facility]: [phone]. ° °

## 2016-01-29 ENCOUNTER — Inpatient Hospital Stay (HOSPITAL_COMMUNITY): Payer: BLUE CROSS/BLUE SHIELD

## 2016-01-29 DIAGNOSIS — Z79899 Other long term (current) drug therapy: Secondary | ICD-10-CM | POA: Diagnosis not present

## 2016-01-29 DIAGNOSIS — K449 Diaphragmatic hernia without obstruction or gangrene: Secondary | ICD-10-CM | POA: Diagnosis not present

## 2016-01-29 DIAGNOSIS — R1310 Dysphagia, unspecified: Secondary | ICD-10-CM | POA: Diagnosis not present

## 2016-01-29 DIAGNOSIS — I10 Essential (primary) hypertension: Secondary | ICD-10-CM | POA: Diagnosis not present

## 2016-01-29 LAB — BASIC METABOLIC PANEL
ANION GAP: 5 (ref 5–15)
BUN: 17 mg/dL (ref 6–20)
CO2: 27 mmol/L (ref 22–32)
Calcium: 8.7 mg/dL — ABNORMAL LOW (ref 8.9–10.3)
Chloride: 109 mmol/L (ref 101–111)
Creatinine, Ser: 0.64 mg/dL (ref 0.44–1.00)
Glucose, Bld: 186 mg/dL — ABNORMAL HIGH (ref 65–99)
POTASSIUM: 3.7 mmol/L (ref 3.5–5.1)
SODIUM: 141 mmol/L (ref 135–145)

## 2016-01-29 MED ORDER — HYDROCODONE-ACETAMINOPHEN 7.5-325 MG/15ML PO SOLN
10.0000 mL | ORAL | Status: DC | PRN
  Administered 2016-01-29 – 2016-01-30 (×5): 10 mL via ORAL
  Filled 2016-01-29 (×6): qty 15

## 2016-01-29 NOTE — Progress Notes (Signed)
1 Day Post-Op Rob Nissen Subjective: Had some nausea and "neck pressure" overnight.  Somewhat better today.  Feels light headed when she gets up.    Objective: Vital signs in last 24 hours: Temp:  [97.6 F (36.4 C)-98.7 F (37.1 C)] 97.7 F (36.5 C) (09/16 0607) Pulse Rate:  [64-109] 77 (09/16 0607) Resp:  [16-23] 18 (09/16 0607) BP: (93-126)/(55-80) 97/58 (09/16 0607) SpO2:  [95 %-100 %] 100 % (09/16 0607) Weight:  [85.3 kg (188 lb)] 85.3 kg (188 lb) (09/15 1100)   Intake/Output from previous day: 09/15 0701 - 09/16 0700 In: 4040 [I.V.:4040] Out: 500 [Urine:450; Blood:50] Intake/Output this shift: No intake/output data recorded.   General appearance: alert and cooperative GI: soft, non-distended  Incision: no significant drainage  Lab Results:  No results for input(s): WBC, HGB, HCT, PLT in the last 72 hours. BMET  Recent Labs  01/29/16 0430  NA 141  K 3.7  CL 109  CO2 27  GLUCOSE 186*  BUN 17  CREATININE 0.64  CALCIUM 8.7*   PT/INR No results for input(s): LABPROT, INR in the last 72 hours. ABG No results for input(s): PHART, HCO3 in the last 72 hours.  Invalid input(s): PCO2, PO2  MEDS, Scheduled . enoxaparin (LOVENOX) injection  40 mg Subcutaneous Q24H    Studies/Results: No results found.  Assessment: s/p Procedure(s): XI ROBOTIC HIATAL HERNIA REPAIR WITH MESH AND  NISSEN FUNDOPLICATION INSERTION OF MESH Patient Active Problem List   Diagnosis Date Noted  . S/P Nissen fundoplication (with gastrostomy tube placement) ([facility]) 01/28/2016  . Allergic rhinitis 01/03/2016  . Edema 01/03/2016  . Hypokalemia 01/03/2016  . Hiatal hernia 01/03/2016  . Esophageal reflux 01/03/2016  . Body mass index 34.0-34.9, adult 01/03/2016      Plan: Esophagram today.  If neg for leak, will start liquid diet  Crushed meds only once films are done Doesn't seem quite ready for discharge today   LOS: 1 day     .Vanita PandaAlicia C Delshon Blanchfield, MD [facility] [place] [phone]   01/29/2016 7:46 AM

## 2016-01-30 ENCOUNTER — Other Ambulatory Visit: Payer: Self-pay | Admitting: General Surgery

## 2016-01-30 DIAGNOSIS — R1310 Dysphagia, unspecified: Secondary | ICD-10-CM | POA: Diagnosis not present

## 2016-01-30 DIAGNOSIS — Z79899 Other long term (current) drug therapy: Secondary | ICD-10-CM | POA: Diagnosis not present

## 2016-01-30 DIAGNOSIS — K449 Diaphragmatic hernia without obstruction or gangrene: Secondary | ICD-10-CM | POA: Diagnosis not present

## 2016-01-30 DIAGNOSIS — I10 Essential (primary) hypertension: Secondary | ICD-10-CM | POA: Diagnosis not present

## 2016-01-30 MED ORDER — HYDROCODONE-ACETAMINOPHEN 7.5-325 MG/15ML PO SOLN: 10.0000 mL | ORAL | 0 refills | Status: DC | PRN

## 2016-01-30 MED ORDER — IOPAMIDOL (ISOVUE-300) INJECTION 61%
50.0000 mL | Freq: Once | INTRAVENOUS | Status: AC | PRN
  Administered 2016-01-29: 50 mL via ORAL

## 2016-01-30 NOTE — Progress Notes (Signed)
Assessment unchanged. Nausea subsided. Pt verbalized understanding of dc instructions through teach back regarding follow up care, diet, medications and when to call the doctor. Script x 1 given as provided by MD. Discharged via wc to front entrance to meet awaiting vehicle to carry home. Accompanied by mom and NT.

## 2016-01-30 NOTE — Discharge Summary (Signed)
Physician Discharge Summary  Patient ID: Darlene [name] MRN: [id number] DOB/AGE: July 08, 1970 45 y.o.  Admit date: 01/28/2016 Discharge date: 01/30/2016  Admission Diagnoses: hiatal hernia  Discharge Diagnoses:  Active Problems:   S/P Nissen fundoplication (with gastrostomy tube placement) [facility](HCC)   Discharged Condition: good  Hospital Course: Patient admitted after surgery.  Esophagram the following day showed no leak.  She tolerated clears without difficulty.  It was felt that she was in stable condition for discharge home.    Consults: None  Significant Diagnostic Studies: labs: cbc, chemistry  Treatments: IV hydration, analgesia: acetaminophen w/ codeine and surgery: hiatal hernia repair  Discharge Exam: Blood pressure 109/69, pulse 92, temperature 98.9 F (37.2 C), temperature source Oral, resp. rate 18, height 5' 2.5" (1.588 m), weight 85.3 kg (188 lb), last menstrual period 01/27/2016, SpO2 92 %. General appearance: alert and cooperative GI: soft, non-distended Incision/Wound: clean, dry, intact  Disposition: home    Medication List    TAKE these medications   acetaminophen-codeine 300-30 MG tablet Commonly known as:  TYLENOL #3 Take 1-2 tablets by mouth every 6 (six) hours as needed for moderate pain.   albuterol 108 (90 Base) MCG/ACT inhaler Commonly known as:  PROVENTIL HFA;VENTOLIN HFA Inhale 2 puffs into the lungs every 4 (four) hours as needed for wheezing or shortness of breath.   esomeprazole 40 MG capsule Commonly known as:  NEXIUM Take 1 capsule (40 mg total) by mouth daily. What changed:  when to take this   hydrochlorothiazide 25 MG tablet Commonly known as:  HYDRODIURIL Take 25 mg by mouth daily with breakfast.   HYDROcodone-acetaminophen 7.5-325 mg/15 ml solution Commonly known as:  HYCET Take 10 mLs by mouth every 4 (four) hours as needed for moderate pain.   ibuprofen 800 MG tablet Commonly known as:  ADVIL,MOTRIN Take 800 mg by mouth 3  (three) times daily as needed for headache.   KLOR-CON M20 20 MEQ tablet Generic drug:  potassium chloride SA Take 20 mEq by mouth daily with breakfast.   lisinopril 5 MG tablet Commonly known as:  PRINIVIL,ZESTRIL Take 5 mg by mouth daily with breakfast.   loratadine 10 MG tablet Commonly known as:  CLARITIN Take 10 mg by mouth daily with breakfast.   Vitamin D (Ergocalciferol) 50000 units Caps capsule Commonly known as:  DRISDOL Take 50,000 Units by mouth 2 (two) times a week. Takes on Sunday's and Wednesday's      Follow-up Information    Marigene Ehlersamirez Jr., Jed LimerickArmando, MD. Schedule an appointment as soon as possible for a visit in 2 week(s).   Specialty:  General Surgery Why:  For wound re-check Contact information: [address] [place] [address] [phone]           Signed: Vanita PandaHOMAS, Vincent Streater C. 01/30/2016, 7:35 AM

## 2016-01-30 NOTE — Progress Notes (Signed)
Dr. Maisie Fushomas aware of recent nausea. No vomiting noted. Zofran SL tab given. Md to call antiemetic in to pt's home pharmacy.

## 2016-01-31 ENCOUNTER — Encounter (HOSPITAL_COMMUNITY): Payer: Self-pay | Admitting: General Surgery

## 2016-02-02 ENCOUNTER — Encounter: Payer: Self-pay | Admitting: Physician Assistant

## 2016-02-06 ENCOUNTER — Encounter: Payer: Self-pay | Admitting: Physician Assistant

## 2016-02-07 ENCOUNTER — Encounter: Payer: Self-pay | Admitting: Physician Assistant

## 2016-02-07 MED ORDER — BENZONATATE 200 MG PO CAPS: 200.0000 mg | ORAL_CAPSULE | Freq: Three times a day (TID) | ORAL | 0 refills | Status: DC | PRN

## 2016-03-16 ENCOUNTER — Ambulatory Visit (INDEPENDENT_AMBULATORY_CARE_PROVIDER_SITE_OTHER): Payer: BLUE CROSS/BLUE SHIELD | Admitting: Gastroenterology

## 2016-03-16 ENCOUNTER — Encounter: Payer: Self-pay | Admitting: Gastroenterology

## 2016-03-16 VITALS — BP 110/70 | HR 72 | Ht 62.0 in | Wt 186.5 lb

## 2016-03-16 DIAGNOSIS — K760 Fatty (change of) liver, not elsewhere classified: Secondary | ICD-10-CM

## 2016-03-16 DIAGNOSIS — K449 Diaphragmatic hernia without obstruction or gangrene: Secondary | ICD-10-CM

## 2016-03-16 NOTE — Progress Notes (Signed)
HPI :  45 year old female with history as below, here for a follow-up visit. She initially presented a few months ago with epigastric discomfort after swallowing. EGD 10/20/2015 - paraesophageal hernia, otherwise normal, no evidence of H pylori  CT scan confirmed large hernia, otherwise no acute process noted US showed no gallstones, but hepatic steatosis noted Gastric emptying study normal  Patient referred to surgery to discuss hernia repair, and had hernia repair with Nissen fundoplication in September. She reports it went well, saw surgeon yesterday for follow up. She reports her symptoms have "100% resolved". Her pain has resolved, she has been able to eat without trouble and pain, feels great in general. She has had been on nexium previously, but had questions about this moving forward.   No FH of liver disease. No cirrhosis known. She denies any alcohol use. She reports weight has been good, she has lost 30 lbs since the past few months, trying to so. She does not like to drink coffee    Past Medical History:  Diagnosis Date  . GERD (gastroesophageal reflux disease)   . Hiatal hernia   . Hypertension   . Hypokalemia   . OA (osteoarthritis)   . Plantar fasciitis   . Vitamin D deficiency      Past Surgical History:  Procedure Laterality Date  . CESAREAN SECTION  1993  . FOOT SURGERY Right 1997  . INSERTION OF MESH N/A 01/28/2016   Procedure: INSERTION OF MESH;  Surgeon: Axel FillerArmando Ramirez, MD;  Location: [facility];  Service: General;  Laterality: N/A;  . Right Knee Surgery    . tubal ligation     Family History  Problem Relation Age of Onset  . High blood pressure Mother   . Diabetes Mother   . Kidney cancer Mother   . Colon cancer Neg Hx    Social History  Substance Use Topics  . Smoking status: Never Smoker  . Smokeless tobacco: Never Used  . Alcohol use No   Current Outpatient Prescriptions  Medication Sig Dispense Refill  . acetaminophen-codeine (TYLENOL #3)  300-30 MG tablet Take 1-2 tablets by mouth every 6 (six) hours as needed for moderate pain. 90 tablet 0  . albuterol (PROVENTIL HFA;VENTOLIN HFA) 108 (90 Base) MCG/ACT inhaler Inhale 2 puffs into the lungs every 4 (four) hours as needed for wheezing or shortness of breath.    . esomeprazole (NEXIUM) 40 MG capsule Take 1 capsule (40 mg total) by mouth daily. (Patient taking differently: Take 40 mg by mouth daily with breakfast. ) 30 capsule 11  . hydrochlorothiazide (HYDRODIURIL) 25 MG tablet Take 25 mg by mouth daily with breakfast.     . ibuprofen (ADVIL,MOTRIN) 800 MG tablet Take 800 mg by mouth 3 (three) times daily as needed for headache.     . lisinopril (PRINIVIL,ZESTRIL) 5 MG tablet Take 5 mg by mouth daily with breakfast.     . loratadine (CLARITIN) 10 MG tablet Take 10 mg by mouth daily with breakfast.     . potassium chloride SA (KLOR-CON M20) 20 MEQ tablet Take 20 mEq by mouth daily with breakfast.     . Vitamin D, Ergocalciferol, (DRISDOL) 50000 units CAPS capsule Take 50,000 Units by mouth 2 (two) times a week. Takes on Sunday's and Wednesday's     No current facility-administered medications for this visit.    No Known Allergies   Review of Systems: All systems reviewed and negative except where noted in HPI.    Lab Results  Component  Value Date   ALT 12 01/03/2016   AST 10 01/03/2016   ALKPHOS 95 01/03/2016   BILITOT <0.2 01/03/2016     Physical Exam: BP 110/70 (BP Location: Left Arm, Patient Position: Sitting, Cuff Size: Normal)   Pulse 72   Ht 5\' 2"  (1.575 m) Comment: height measured without shoes  Wt 186 lb 8 oz (84.6 kg)   LMP 02/13/2016   BMI 34.11 kg/m  Constitutional: Pleasant,well-developed, female in no acute distress. HEENT: Normocephalic and atraumatic. Conjunctivae are normal. No scleral icterus. Neck supple.  Cardiovascular: Normal rate, regular rhythm.  Pulmonary/chest: Effort normal and breath sounds normal. No wheezing, rales or  rhonchi. Abdominal: Soft, nondistended, nontender. . There are no masses palpable. No hepatomegaly. Extremities: no edema Lymphadenopathy: No cervical adenopathy noted. Neurological: Alert and oriented to person place and time. Skin: Skin is warm and dry. No rashes noted. Psychiatric: Normal mood and affect. Behavior is normal.   ASSESSMENT AND PLAN: 45 year old female here for reassessment of the following issues:  Hiatal hernia - large and symptomatic, now status post repair with Nissen fundoplication, symptoms have completely resolved. I counseled her I don't think she needs to continue Nexium any further if she is not having any reflux symptoms. She can follow up as needed with any recurrent symptoms.  Fatty liver- noted incidentally on imaging. Her ALT is normal. I counseled her on the spectrum of fatty liver disease, potential risks of hepatic inflammation/fibrosis/cirrhosis. She has lost a significant amount of weight since her last visit, ALT normal. She should have her LFTs checked once yearly moving forward. I counseled her that routine coffee drinking can be helpful to prevent fibrotic changes. She will continue to abstain from alcohol. All questions answered.  Ileene PatrickSteven Kalanie Fewell, MD [facility] Pager [phone]

## 2016-03-16 NOTE — Patient Instructions (Addendum)
Please follow up with Dr. Adela LankArmbruster in 1 year.

## 2016-03-28 DIAGNOSIS — Z1231 Encounter for screening mammogram for malignant neoplasm of breast: Secondary | ICD-10-CM | POA: Diagnosis not present

## 2016-04-03 ENCOUNTER — Ambulatory Visit (INDEPENDENT_AMBULATORY_CARE_PROVIDER_SITE_OTHER): Payer: BLUE CROSS/BLUE SHIELD | Admitting: Physician Assistant

## 2016-04-03 ENCOUNTER — Encounter: Payer: Self-pay | Admitting: Physician Assistant

## 2016-04-03 VITALS — BP 112/79 | HR 90 | Temp 97.8°F | Ht 62.0 in | Wt 186.6 lb

## 2016-04-03 DIAGNOSIS — Z01419 Encounter for gynecological examination (general) (routine) without abnormal findings: Secondary | ICD-10-CM | POA: Diagnosis not present

## 2016-04-03 DIAGNOSIS — Z23 Encounter for immunization: Secondary | ICD-10-CM | POA: Diagnosis not present

## 2016-04-03 DIAGNOSIS — M47816 Spondylosis without myelopathy or radiculopathy, lumbar region: Secondary | ICD-10-CM

## 2016-04-03 MED ORDER — ACETAMINOPHEN-CODEINE 300-30 MG PO TABS: 1.0000 | ORAL_TABLET | Freq: Three times a day (TID) | ORAL | 0 refills | Status: DC | PRN

## 2016-04-03 MED ORDER — ACETAMINOPHEN-CODEINE #3 300-30 MG PO TABS: 1.0000 | ORAL_TABLET | Freq: Four times a day (QID) | ORAL | 0 refills | Status: DC | PRN

## 2016-04-03 NOTE — Patient Instructions (Signed)
Food Choices for Gastroesophageal Reflux Disease, Adult When you have gastroesophageal reflux disease (GERD), the foods you eat and your eating habits are very important. Choosing the right foods can help ease your discomfort. What guidelines do I need to follow?  Choose fruits, vegetables, whole grains, and low-fat dairy products.  Choose low-fat meat, fish, and poultry.  Limit fats such as oils, salad dressings, butter, nuts, and avocado.  Keep a food diary. This helps you identify foods that cause symptoms.  Avoid foods that cause symptoms. These may be different for everyone.  Eat small meals often instead of 3 large meals a day.  Eat your meals slowly, in a place where you are relaxed.  Limit fried foods.  Cook foods using methods other than frying.  Avoid drinking alcohol.  Avoid drinking large amounts of liquids with your meals.  Avoid bending over or lying down until 2-3 hours after eating. What foods are not recommended? These are some foods and drinks that may make your symptoms worse: Vegetables  Tomatoes. Tomato juice. Tomato and spaghetti sauce. Chili peppers. Onion and garlic. Horseradish. Fruits  Oranges, grapefruit, and lemon (fruit and juice). Meats  High-fat meats, fish, and poultry. This includes hot dogs, ribs, ham, sausage, salami, and bacon. Dairy  Whole milk and chocolate milk. Sour cream. Cream. Butter. Ice cream. Cream cheese. Drinks  Coffee and tea. Bubbly (carbonated) drinks or energy drinks. Condiments  Hot sauce. Barbecue sauce. Sweets/Desserts  Chocolate and cocoa. Donuts. Peppermint and spearmint. Fats and Oils  High-fat foods. This includes French fries and potato chips. Other  Vinegar. Strong spices. This includes black pepper, white pepper, red pepper, cayenne, curry powder, cloves, ginger, and chili powder. The items listed above may not be a complete list of foods and drinks to avoid. Contact your dietitian for more information.    This information is not intended to replace advice given to you by your health care provider. Make sure you discuss any questions you have with your health care provider. Document Released: 10/31/2011 Document Revised: 10/07/2015 Document Reviewed: 03/05/2013 Elsevier Interactive Patient Education  2017 Elsevier Inc.  

## 2016-04-03 NOTE — Progress Notes (Signed)
BP 112/79   Pulse 90   Temp 97.8 F (36.6 C) (Oral)   Ht 5\' 2"  (1.575 m)   Wt 186 lb 9.6 oz (84.6 kg)   LMP 02/13/2016   BMI 34.13 kg/m    Subjective:    Patient ID: Darlene [name], female    DOB: [birth year], 45 y.o.   MRN: [id number]  HPI: Darlene [name] is a 45 y.o. female presenting on 04/03/2016 for Annual Exam  This patient comes in for annual check and pap and recheck on medications and conditions. All medications are reviewed today. There are no reports of any problems with the medications. All of the medical conditions are reviewed and updated.  Lab work is reviewed and will be ordered as medically necessary. There are no new problems reported with today's visit.   Past Medical History:  Diagnosis Date  . GERD (gastroesophageal reflux disease)   . Hiatal hernia   . Hypertension   . Hypokalemia   . OA (osteoarthritis)   . Plantar fasciitis   . Vitamin D deficiency    Relevant past medical, surgical, family and social history reviewed and updated as indicated. Interim medical history since our last visit reviewed. Allergies and medications reviewed and updated. DATA REVIEWED: CHART IN EPIC  Social History   Social History  . Marital status: Legally Separated    Spouse name: N/A  . Number of children: 3  . Years of education: N/A   Occupational History  . TEXTILE    Social History Main Topics  . Smoking status: Never Smoker  . Smokeless tobacco: Never Used  . Alcohol use No  . Drug use: No  . Sexual activity: Not on file   Other Topics Concern  . Not on file   Social History Narrative  . No narrative on file    Past Surgical History:  Procedure Laterality Date  . CESAREAN SECTION  1993  . FOOT SURGERY Right 1997  . INSERTION OF MESH N/A 01/28/2016   Procedure: INSERTION OF MESH;  Surgeon: Axel Filler, MD;  Location: [facility];  Service: General;  Laterality: N/A;  . Right Knee Surgery    . tubal ligation      Family History  Problem Relation Age  of Onset  . High blood pressure Mother   . Diabetes Mother   . Kidney cancer Mother   . Colon cancer Neg Hx     Review of Systems  Constitutional: Negative.  Negative for activity change, fatigue and fever.  HENT: Negative.   Eyes: Negative.   Respiratory: Negative.  Negative for cough.   Cardiovascular: Negative.  Negative for chest pain.  Gastrointestinal: Negative.  Negative for abdominal pain.  Endocrine: Negative.   Genitourinary: Negative.  Negative for dysuria.  Musculoskeletal: Negative.   Skin: Negative.   Neurological: Negative.       Medication List       Accurate as of 04/03/16  5:05 PM. Always use your most recent med list.          acetaminophen-codeine 300-30 MG tablet Commonly known as:  TYLENOL #3 Take 1-2 tablets by mouth every 6 (six) hours as needed for moderate pain.   Acetaminophen-Codeine 300-30 MG tablet Take 1 tablet by mouth every 8 (eight) hours as needed for pain.   Acetaminophen-Codeine 300-30 MG tablet Take 1 tablet by mouth every 8 (eight) hours as needed for pain.   albuterol 108 (90 Base) MCG/ACT inhaler Commonly known as:  PROVENTIL HFA;VENTOLIN HFA Inhale 2 puffs  into the lungs every 4 (four) hours as needed for wheezing or shortness of breath.   hydrochlorothiazide 25 MG tablet Commonly known as:  HYDRODIURIL Take 25 mg by mouth daily with breakfast.   ibuprofen 800 MG tablet Commonly known as:  ADVIL,MOTRIN Take 800 mg by mouth 3 (three) times daily as needed for headache.   KLOR-CON M20 20 MEQ tablet Generic drug:  potassium chloride SA Take 20 mEq by mouth daily with breakfast.   lisinopril 5 MG tablet Commonly known as:  PRINIVIL,ZESTRIL Take 5 mg by mouth daily with breakfast.   loratadine 10 MG tablet Commonly known as:  CLARITIN Take 10 mg by mouth daily with breakfast.   Vitamin D (Ergocalciferol) 50000 units Caps capsule Commonly known as:  DRISDOL Take 50,000 Units by mouth 2 (two) times a week. Takes on  Sunday's and Wednesday's          Objective:    BP 112/79   Pulse 90   Temp 97.8 F (36.6 C) (Oral)   Ht 5\' 2"  (1.575 m)   Wt 186 lb 9.6 oz (84.6 kg)   LMP 02/13/2016   BMI 34.13 kg/m   No Known Allergies  Wt Readings from Last 3 Encounters:  04/03/16 186 lb 9.6 oz (84.6 kg)  03/16/16 186 lb 8 oz (84.6 kg)  01/28/16 188 lb (85.3 kg)    Physical Exam  Constitutional: She is oriented to person, place, and time. She appears well-developed and well-nourished.  HENT:  Head: Normocephalic and atraumatic.  Eyes: Conjunctivae and EOM are normal. Pupils are equal, round, and reactive to light.  Neck: Normal range of motion. Neck supple.  Cardiovascular: Normal rate, regular rhythm, normal heart sounds and intact distal pulses.   Pulmonary/Chest: Effort normal and breath sounds normal. Right breast exhibits no mass, no skin change and no tenderness. Left breast exhibits no mass, no skin change and no tenderness. Breasts are symmetrical.  Abdominal: Soft. Bowel sounds are normal.  Genitourinary: Vagina normal and uterus normal. Rectal exam shows no fissure. No breast swelling, tenderness, discharge or bleeding. There is no tenderness or lesion on the right labia. There is no tenderness or lesion on the left labia. Uterus is not deviated, not enlarged and not tender. Cervix exhibits no motion tenderness, no discharge and no friability. Right adnexum displays no mass, no tenderness and no fullness. Left adnexum displays no mass, no tenderness and no fullness. No tenderness or bleeding in the vagina. No vaginal discharge found.  Neurological: She is alert and oriented to person, place, and time. She has normal reflexes.  Skin: Skin is warm and dry. No rash noted.  Psychiatric: She has a normal mood and affect. Her behavior is normal. Judgment and thought content normal.        Assessment & Plan:   1. Well female exam with routine gynecological exam - Pap IG w/ reflex to HPV when  ASC-U  2. Spondylosis of lumbar region without myelopathy or radiculopathy - acetaminophen-codeine (TYLENOL #3) 300-30 MG tablet; Take 1-2 tablets by mouth every 6 (six) hours as needed for moderate pain.  Dispense: 90 tablet; Refill: 0  3. Encounter for immunization - Flu Vaccine QUAD 36+ mos IM    Continue all other maintenance medications as listed above.  Follow up plan: Return in about 3 months (around 07/04/2016).  Orders Placed This Encounter  Procedures  . Flu Vaccine QUAD 36+ mos IM    Educational handout given for GERD  Remus LofflerAngel S. Anabeth Chilcott PA-C [facility] [address]  [place], [place] [address] [phone]   04/03/2016, 5:05 PM

## 2016-04-05 LAB — PAP IG W/ RFLX HPV ASCU: PAP Smear Comment: 0

## 2016-07-04 ENCOUNTER — Ambulatory Visit (INDEPENDENT_AMBULATORY_CARE_PROVIDER_SITE_OTHER): Payer: BLUE CROSS/BLUE SHIELD | Admitting: Physician Assistant

## 2016-07-04 ENCOUNTER — Encounter: Payer: Self-pay | Admitting: Physician Assistant

## 2016-07-04 VITALS — BP 107/71 | HR 87 | Temp 99.0°F | Ht 62.0 in | Wt 190.6 lb

## 2016-07-04 DIAGNOSIS — F411 Generalized anxiety disorder: Secondary | ICD-10-CM

## 2016-07-04 DIAGNOSIS — M722 Plantar fascial fibromatosis: Secondary | ICD-10-CM

## 2016-07-04 DIAGNOSIS — M47816 Spondylosis without myelopathy or radiculopathy, lumbar region: Secondary | ICD-10-CM | POA: Diagnosis not present

## 2016-07-04 DIAGNOSIS — M159 Polyosteoarthritis, unspecified: Secondary | ICD-10-CM

## 2016-07-04 MED ORDER — ACETAMINOPHEN-CODEINE #3 300-30 MG PO TABS: 1.0000 | ORAL_TABLET | Freq: Four times a day (QID) | ORAL | 0 refills | Status: DC | PRN

## 2016-07-04 MED ORDER — ACETAMINOPHEN-CODEINE 300-30 MG PO TABS: 1.0000 | ORAL_TABLET | Freq: Three times a day (TID) | ORAL | 0 refills | Status: DC | PRN

## 2016-07-04 MED ORDER — CITALOPRAM HYDROBROMIDE 20 MG PO TABS: 20.0000 mg | ORAL_TABLET | Freq: Every day | ORAL | 1 refills | Status: DC

## 2016-07-04 NOTE — Patient Instructions (Signed)
Adjustment Disorder Adjustment disorder is an unusually severe reaction to a stressful life event, such as the loss of a job or physical illness. The event may be any stressful event other than the loss of a loved one. Adjustment disorder may affect your feelings, your thinking, how you act, or a combination of these. It may interfere with personal relationships or with the way you are at work, school, or home. People with this disorder are at risk for suicide and substance abuse. They may develop a more serious mental disorder, such as major depressive disorder or post-traumatic stress disorder. SIGNS AND SYMPTOMS  Symptoms may include:  Sadness, depressed mood, or crying spells.  Loss of enjoyment.  Change in appetite or weight.  Sense of loss or hopelessness.  Thoughts of suicide.  Anxiety, worry, or nervousness.  Trouble sleeping.  Avoiding family and friends.  Poor school performance.  Fighting or vandalism.  Reckless driving.  Skipping school.  Poor work performance.  Ignoring bills. Symptoms of adjustment disorder start within 3 months of the stressful life event. They do not last more than 6 months after the event has ended. DIAGNOSIS  To make a diagnosis, your health care provider will ask about what has happened in your life and how it has affected you. He or she may also ask about your medical history and use of medicines, alcohol, and other substances. Your health care provider may do a physical exam and order lab tests or other studies. You may be referred to a mental health specialist for evaluation. TREATMENT  Treatment options include:  Counseling or talk therapy. Talk therapy is usually provided by mental health specialists.  Medicine. Certain medicines may help with depression, anxiety, and sleep.  Support groups. Support groups offer emotional support, advice, and guidance. They are made up of people who have had similar experiences. HOME CARE  INSTRUCTIONS  Keep all follow-up visits as directed by your health care provider. This is important.  Take medicines only as directed by your health care provider. SEEK MEDICAL CARE IF:  Your symptoms get worse.  SEEK IMMEDIATE MEDICAL CARE IF: You have serious thoughts about hurting yourself or someone else. MAKE SURE YOU:  Understand these instructions.  Will watch your condition.  Will get help right away if you are not doing well or get worse. This information is not intended to replace advice given to you by your health care provider. Make sure you discuss any questions you have with your health care provider. Document Released: 01/03/2006 Document Revised: 08/23/2015 Document Reviewed: 09/23/2013 Elsevier Interactive Patient Education  2017 Elsevier Inc.  

## 2016-07-04 NOTE — Progress Notes (Signed)
BP 107/71   Pulse 87   Temp 99 F (37.2 C) (Oral)   Ht 5\' 2"  (1.575 m)   Wt 190 lb 9.6 oz (86.5 kg)   BMI 34.86 kg/m    Subjective:    Patient ID: Darlene [name], female    DOB: 02/16/1971, 46 y.o.   MRN: [id number]  HPI: Darlene [name] is a 46 y.o. female presenting on 07/04/2016 for Follow-up (3 month ) and Medication Refill  This patient comes in for periodic recheck on medications and conditions including spondylosis, plantar fasciitis. In addition she has great increase in general anxiety that is daily and persistent. Denies panic attacks. Currently in a job situation that she cannot control and will not be over quickly.  Understands that she has to be able to cope and deal with the difficult situation and people..   All medications are reviewed today. There are no reports of any problems with the medications. All of the medical conditions are reviewed and updated.  Lab work is reviewed and will be ordered as medically necessary. There are no new problems reported with today's visit.   Relevant past medical, surgical, family and social history reviewed and updated as indicated. Allergies and medications reviewed and updated.  Past Medical History:  Diagnosis Date  . GERD (gastroesophageal reflux disease)   . Hiatal hernia   . Hypertension   . Hypokalemia   . OA (osteoarthritis)   . Plantar fasciitis   . Vitamin D deficiency     Past Surgical History:  Procedure Laterality Date  . CESAREAN SECTION  1993  . FOOT SURGERY Right 1997  . INSERTION OF MESH N/A 01/28/2016   Procedure: INSERTION OF MESH;  Surgeon: Axel FillerArmando Ramirez, MD;  Location: [facility];  Service: General;  Laterality: N/A;  . Right Knee Surgery    . tubal ligation      Review of Systems  Constitutional: Negative.  Negative for activity change, fatigue and fever.  HENT: Negative.   Eyes: Negative.   Respiratory: Negative.  Negative for cough.   Cardiovascular: Negative.  Negative for chest pain.    Gastrointestinal: Negative.  Negative for abdominal pain.  Endocrine: Negative.   Genitourinary: Negative.  Negative for dysuria.  Musculoskeletal: Negative.   Skin: Negative.   Neurological: Negative.     Allergies as of 07/04/2016   No Known Allergies     Medication List       Accurate as of 07/04/16  5:10 PM. Always use your most recent med list.          Acetaminophen-Codeine 300-30 MG tablet Take 1 tablet by mouth every 8 (eight) hours as needed for pain.   Acetaminophen-Codeine 300-30 MG tablet Take 1 tablet by mouth every 8 (eight) hours as needed for pain.   acetaminophen-codeine 300-30 MG tablet Commonly known as:  TYLENOL #3 Take 1-2 tablets by mouth every 6 (six) hours as needed for moderate pain.   albuterol 108 (90 Base) MCG/ACT inhaler Commonly known as:  PROVENTIL HFA;VENTOLIN HFA Inhale 2 puffs into the lungs every 4 (four) hours as needed for wheezing or shortness of breath.   citalopram 20 MG tablet Commonly known as:  CELEXA Take 1 tablet (20 mg total) by mouth daily.   hydrochlorothiazide 25 MG tablet Commonly known as:  HYDRODIURIL Take 25 mg by mouth daily with breakfast.   ibuprofen 800 MG tablet Commonly known as:  ADVIL,MOTRIN Take 800 mg by mouth 3 (three) times daily as needed for headache.  KLOR-CON M20 20 MEQ tablet Generic drug:  potassium chloride SA Take 20 mEq by mouth daily with breakfast.   lisinopril 5 MG tablet Commonly known as:  PRINIVIL,ZESTRIL Take 5 mg by mouth daily with breakfast.   loratadine 10 MG tablet Commonly known as:  CLARITIN Take 10 mg by mouth daily with breakfast.   Vitamin D (Ergocalciferol) 50000 units Caps capsule Commonly known as:  DRISDOL Take 50,000 Units by mouth 2 (two) times a week. Takes on Sunday's and Wednesday's          Objective:    BP 107/71   Pulse 87   Temp 99 F (37.2 C) (Oral)   Ht 5\' 2"  (1.575 m)   Wt 190 lb 9.6 oz (86.5 kg)   BMI 34.86 kg/m   No Known  Allergies  Physical Exam  Constitutional: She is oriented to person, place, and time. She appears well-developed and well-nourished.  HENT:  Head: Normocephalic and atraumatic.  Eyes: Conjunctivae and EOM are normal. Pupils are equal, round, and reactive to light.  Cardiovascular: Normal rate, regular rhythm, normal heart sounds and intact distal pulses.   Pulmonary/Chest: Effort normal and breath sounds normal.  Abdominal: Soft. Bowel sounds are normal.  Musculoskeletal: She exhibits tenderness.       Lumbar back: She exhibits decreased range of motion, tenderness and pain.  Neurological: She is alert and oriented to person, place, and time. She has normal reflexes.  Skin: Skin is warm and dry. No rash noted.  Psychiatric: She has a normal mood and affect. Her behavior is normal. Judgment and thought content normal.        Assessment & Plan:   1. Generalized OA - Acetaminophen-Codeine 300-30 MG tablet; Take 1 tablet by mouth every 8 (eight) hours as needed for pain.  Dispense: 90 tablet; Refill: 0 - Acetaminophen-Codeine 300-30 MG tablet; Take 1 tablet by mouth every 8 (eight) hours as needed for pain.  Dispense: 90 tablet; Refill: 0 - acetaminophen-codeine (TYLENOL #3) 300-30 MG tablet; Take 1-2 tablets by mouth every 6 (six) hours as needed for moderate pain.  Dispense: 90 tablet; Refill: 0  2. Plantar fasciitis - Acetaminophen-Codeine 300-30 MG tablet; Take 1 tablet by mouth every 8 (eight) hours as needed for pain.  Dispense: 90 tablet; Refill: 0 - Acetaminophen-Codeine 300-30 MG tablet; Take 1 tablet by mouth every 8 (eight) hours as needed for pain.  Dispense: 90 tablet; Refill: 0 - acetaminophen-codeine (TYLENOL #3) 300-30 MG tablet; Take 1-2 tablets by mouth every 6 (six) hours as needed for moderate pain.  Dispense: 90 tablet; Refill: 0  3. GAD (generalized anxiety disorder) - citalopram (CELEXA) 20 MG tablet; Take 1 tablet (20 mg total) by mouth daily.  Dispense: 30 tablet;  Refill: 1  4. Spondylosis of lumbar region without myelopathy or radiculopathy - Acetaminophen-Codeine 300-30 MG tablet; Take 1 tablet by mouth every 8 (eight) hours as needed for pain.  Dispense: 90 tablet; Refill: 0 - Acetaminophen-Codeine 300-30 MG tablet; Take 1 tablet by mouth every 8 (eight) hours as needed for pain.  Dispense: 90 tablet; Refill: 0 - acetaminophen-codeine (TYLENOL #3) 300-30 MG tablet; Take 1-2 tablets by mouth every 6 (six) hours as needed for moderate pain.  Dispense: 90 tablet; Refill: 0   Continue all other maintenance medications as listed above.  Follow up plan: Return in about 4 weeks (around 08/01/2016) for recheck med.  Educational handout given for adjustment disorder  Remus Loffler PA-C [facility] [address]  [address]  [place], [place] [address] [phone]   07/04/2016, 5:10 PM

## 2016-07-14 ENCOUNTER — Encounter: Payer: Self-pay | Admitting: Physician Assistant

## 2016-07-18 ENCOUNTER — Other Ambulatory Visit: Payer: Self-pay

## 2016-07-18 MED ORDER — IBUPROFEN 800 MG PO TABS: 800.0000 mg | ORAL_TABLET | Freq: Three times a day (TID) | ORAL | 0 refills | Status: DC | PRN

## 2016-07-20 ENCOUNTER — Other Ambulatory Visit: Payer: Self-pay

## 2016-07-31 ENCOUNTER — Other Ambulatory Visit: Payer: Self-pay | Admitting: Physician Assistant

## 2016-08-01 ENCOUNTER — Encounter: Payer: Self-pay | Admitting: Physician Assistant

## 2016-08-01 ENCOUNTER — Ambulatory Visit (INDEPENDENT_AMBULATORY_CARE_PROVIDER_SITE_OTHER): Payer: BLUE CROSS/BLUE SHIELD | Admitting: Physician Assistant

## 2016-08-01 VITALS — BP 103/67 | HR 95 | Temp 97.0°F | Ht 62.0 in | Wt 193.0 lb

## 2016-08-01 DIAGNOSIS — F411 Generalized anxiety disorder: Secondary | ICD-10-CM | POA: Diagnosis not present

## 2016-08-01 DIAGNOSIS — I471 Supraventricular tachycardia: Secondary | ICD-10-CM | POA: Diagnosis not present

## 2016-08-01 MED ORDER — METOPROLOL SUCCINATE ER 25 MG PO TB24: 12.5000 mg | ORAL_TABLET | Freq: Every day | ORAL | 0 refills | Status: DC

## 2016-08-01 MED ORDER — CITALOPRAM HYDROBROMIDE 20 MG PO TABS: 20.0000 mg | ORAL_TABLET | Freq: Every day | ORAL | 3 refills | Status: DC

## 2016-08-01 NOTE — Patient Instructions (Signed)
Supraventricular Tachycardia, Adult °Supraventricular tachycardia (SVT) is a kind of abnormal heartbeat. It makes your heart beat very fast and then beat at a normal speed. °A normal heart beats 60-100 times a minute. This condition can make your heart beat more than 150 times a minute. Times of having a fast heartbeat (episodes) can be scary, but they are usually not dangerous. They can lead to problems if: °· They happen often. °· They last a long time. °Symptoms of this condition include: °· A pounding heart. °· A feeling that your heart is skipping beats (palpitations). °· Weakness. °· Trouble getting enough air (shortness of breath). °· Pain or tightness in your chest. °· Feeling like you are going to pass out (light-headedness). °· Feeling worried or nervous (anxiety). °· Dizziness. °· Sweating. °· Feeling sick to your stomach (nausea). °· Passing out (fainting). °· Tiredness. °Sometimes, there are no symptoms. °Follow these instructions at home: °Stress  °· Avoid things that make you feel stressed. °· Find out what helps you feel less stressed. Try: °¨ Doing a relaxing activity, like yoga, meditation, or being out in nature. °¨ Listening to relaxing music. °¨ Doing relaxation techniques, like deep breathing. °¨ Taking steps to be healthy. These include getting lots of sleep, exercising, and eating a balanced diet. °¨ Talking with a mental health doctor. °Sleep  °· Try to get at least 7 hours of sleep each night. °Tobacco and nicotine  °· Do not use anything that has nicotine or tobacco, such as cigarettes and e-cigarettes. If you need help quitting, ask your doctor. °Alcohol  °· If alcohol gives you a fast heartbeat, do not drink alcohol. °· If alcohol does not seem to give you a fast heartbeat, limit your alcohol. For nonpregnant women, this means no more than 1 drink a day. For men, this means no more than 2 drinks a day. "One drink" means one of these: °¨ 12 oz of beer. °¨ 5 oz of wine. °¨ 1½ oz of hard  liquor. °Caffeine  °· If caffeine gives you a fast heartbeat, do not eat, drink, or use anything with caffeine in it. °· If caffeine does not seem to give you a fast heartbeat, limit how much caffeine you eat, drink, or use. °Stimulant drugs  °· Do not use stimulant drugs. These are drugs like cocaine or methamphetamine. If you need help quitting, ask your doctor. °General instructions  °· Stay at a healthy weight. °· Exercise regularly. Ask your doctor to suggest some good activities for you. Try one of these options: °¨ 150 minutes a week of gentle exercise, like walking or yoga. °¨ 75 minutes a week of exercise that is very active, like running or swimming. °¨ A combination of gentle exercise and very active exercise. °· Do home treatments to slow down your heartbeat as told by your doctor. °· Take over-the-counter and prescription medicines only as told by your doctor. °Contact a doctor if: °· You have a fast heartbeat more often. °· Times of having a fast heartbeat last longer than before. °· Your home treatments to slow down your heartbeat do not help. °· You have new symptoms. °Get help right away if: °· You have chest pain. °· Your symptoms get worse. °· You have trouble breathing. °· Your heart beats very fast for more than 20 minutes. °· You pass out (faint). °These symptoms may be an emergency. Do not wait to see if the symptoms will go away. Get medical help right away. Call your   local emergency services (911 in the [place] Do not drive yourself to the hospital. °This information is not intended to replace advice given to you by your health care provider. Make sure you discuss any questions you have with your health care provider. °Document Released: 05/01/2005 Document Revised: 01/06/2016 Document Reviewed: 01/06/2016 °Elsevier Interactive Patient Education © 2017 [employer] Inc. ° °

## 2016-08-02 DIAGNOSIS — I471 Supraventricular tachycardia: Secondary | ICD-10-CM | POA: Insufficient documentation

## 2016-08-02 DIAGNOSIS — F411 Generalized anxiety disorder: Secondary | ICD-10-CM | POA: Insufficient documentation

## 2016-08-02 NOTE — Progress Notes (Signed)
BP 103/67   Pulse 95   Temp 97 F (36.1 C) (Oral)   Ht 5\' 2"  (1.575 m)   Wt 193 lb (87.5 kg)   BMI 35.30 kg/m    Subjective:    Patient ID: Darlene [name], female    DOB: [birth year], 46 y.o.   MRN: [id number]  HPI: Darlene [name] is a 46 y.o. female presenting on 08/01/2016 for Follow-up (Patient is here for a 1 month follow up on celexa. Patient states it is going good but she is only taking a 1/2 of a tablet )  This patient comes in for periodic recheck on medications and conditions including depression and rapid heart rate. She reports that the heart rate has been increased over this past month. Not necessarily due to the medication but an ongoing problem that she had not really mentioned last month. She is on lisinopril this time for high blood pressure which is excellently controlled. Therefore we cannot use high-dose of anything to reduce her heart rate. We'll plan to stop lisinopril and change her to metoprolol for mild blood pressure treatment and tachycardia .   All medications are reviewed today. There are no reports of any problems with the medications. All of the medical conditions are reviewed and updated.  Lab work is reviewed and will be ordered as medically necessary. There are no new problems reported with today's visit.   Relevant past medical, surgical, family and social history reviewed and updated as indicated. Allergies and medications reviewed and updated.  Past Medical History:  Diagnosis Date  . GERD (gastroesophageal reflux disease)   . Hiatal hernia   . Hypertension   . Hypokalemia   . OA (osteoarthritis)   . Plantar fasciitis   . Vitamin D deficiency     Past Surgical History:  Procedure Laterality Date  . CESAREAN SECTION  1993  . FOOT SURGERY Right 1997  . INSERTION OF MESH N/A 01/28/2016   Procedure: INSERTION OF MESH;  Surgeon: Axel Filler, MD;  Location: [facility];  Service: General;  Laterality: N/A;  . Right Knee Surgery    . tubal ligation       Review of Systems  Constitutional: Negative.  Negative for activity change, fatigue and fever.  HENT: Negative.   Eyes: Negative.   Respiratory: Negative.  Negative for cough.   Cardiovascular: Positive for palpitations. Negative for chest pain and leg swelling.  Gastrointestinal: Negative.  Negative for abdominal pain.  Endocrine: Negative.   Genitourinary: Negative.  Negative for dysuria.  Musculoskeletal: Negative.   Skin: Negative.   Neurological: Negative.     Allergies as of 08/01/2016   No Known Allergies     Medication List       Accurate as of 08/01/16 11:59 PM. Always use your most recent med list.          Acetaminophen-Codeine 300-30 MG tablet Take 1 tablet by mouth every 8 (eight) hours as needed for pain.   Acetaminophen-Codeine 300-30 MG tablet Take 1 tablet by mouth every 8 (eight) hours as needed for pain.   acetaminophen-codeine 300-30 MG tablet Commonly known as:  TYLENOL #3 Take 1-2 tablets by mouth every 6 (six) hours as needed for moderate pain.   albuterol 108 (90 Base) MCG/ACT inhaler Commonly known as:  PROVENTIL HFA;VENTOLIN HFA Inhale 2 puffs into the lungs every 4 (four) hours as needed for wheezing or shortness of breath.   citalopram 20 MG tablet Commonly known as:  CELEXA Take 1 tablet (20 mg  total) by mouth daily.   hydrochlorothiazide 25 MG tablet Commonly known as:  HYDRODIURIL Take 25 mg by mouth daily with breakfast.   ibuprofen 800 MG tablet Commonly known as:  ADVIL,MOTRIN Take 1 tablet (800 mg total) by mouth 3 (three) times daily as needed for headache.   KLOR-CON M20 20 MEQ tablet Generic drug:  potassium chloride SA TAKE 1 TABLET ONCE A DAY ORALLY 30 DAYS   loratadine 10 MG tablet Commonly known as:  CLARITIN Take 10 mg by mouth daily with breakfast.   metoprolol succinate 25 MG 24 hr tablet Commonly known as:  TOPROL-XL Take 0.5-1 tablets (12.5-25 mg total) by mouth daily.   Vitamin D (Ergocalciferol) 50000  units Caps capsule Commonly known as:  DRISDOL Take 50,000 Units by mouth 2 (two) times a week. Takes on Sunday's and Wednesday's          Objective:    BP 103/67   Pulse 95   Temp 97 F (36.1 C) (Oral)   Ht 5\' 2"  (1.575 m)   Wt 193 lb (87.5 kg)   BMI 35.30 kg/m   No Known Allergies  Physical Exam  Constitutional: She is oriented to person, place, and time. She appears well-developed and well-nourished.  HENT:  Head: Normocephalic and atraumatic.  Eyes: Conjunctivae and EOM are normal. Pupils are equal, round, and reactive to light.  Cardiovascular: Normal rate, regular rhythm, normal heart sounds and intact distal pulses.   Pulmonary/Chest: Effort normal and breath sounds normal.  Abdominal: Soft. Bowel sounds are normal.  Neurological: She is alert and oriented to person, place, and time. She has normal reflexes.  Skin: Skin is warm and dry. No rash noted.  Psychiatric: She has a normal mood and affect. Her behavior is normal. Judgment and thought content normal.        Assessment & Plan:   1. Paroxysmal supraventricular tachycardia (HCC) HOLD LISINOPRIL - metoprolol succinate (TOPROL-XL) 25 MG 24 hr tablet; Take 0.5-1 tablets (12.5-25 mg total) by mouth daily.  Dispense: 90 tablet; Refill: 0  2. GAD (generalized anxiety disorder) - citalopram (CELEXA) 20 MG tablet; Take 1 tablet (20 mg total) by mouth daily.  Dispense: 90 tablet; Refill: 3   Continue all other maintenance medications as listed above.  Follow up plan: Return in about 4 weeks (around 08/29/2016) for recheck.  Educational handout given for tachycardia  Remus LofflerAngel S. Jaquavious Mercer PA-C [facility] [address]  [place], [place] [id number] [phone]   08/02/2016, 9:20 AM

## 2016-08-10 ENCOUNTER — Other Ambulatory Visit: Payer: Self-pay | Admitting: *Deleted

## 2016-08-10 DIAGNOSIS — F411 Generalized anxiety disorder: Secondary | ICD-10-CM

## 2016-08-10 MED ORDER — CITALOPRAM HYDROBROMIDE 20 MG PO TABS: 20.0000 mg | ORAL_TABLET | Freq: Every day | ORAL | 3 refills | Status: DC

## 2016-08-17 ENCOUNTER — Encounter: Payer: Self-pay | Admitting: Physician Assistant

## 2016-08-17 MED ORDER — HYDROCHLOROTHIAZIDE 25 MG PO TABS: 25.0000 mg | ORAL_TABLET | Freq: Every day | ORAL | 0 refills | Status: DC

## 2016-08-18 ENCOUNTER — Encounter: Payer: Self-pay | Admitting: Physician Assistant

## 2016-08-18 ENCOUNTER — Other Ambulatory Visit: Payer: Self-pay | Admitting: Physician Assistant

## 2016-08-18 MED ORDER — VITAMIN D (ERGOCALCIFEROL) 1.25 MG (50000 UNIT) PO CAPS: 50000.0000 [IU] | ORAL_CAPSULE | ORAL | 0 refills | Status: DC

## 2016-08-29 ENCOUNTER — Encounter: Payer: Self-pay | Admitting: Physician Assistant

## 2016-08-29 ENCOUNTER — Ambulatory Visit (INDEPENDENT_AMBULATORY_CARE_PROVIDER_SITE_OTHER): Payer: BLUE CROSS/BLUE SHIELD | Admitting: Physician Assistant

## 2016-08-29 DIAGNOSIS — I471 Supraventricular tachycardia: Secondary | ICD-10-CM | POA: Diagnosis not present

## 2016-08-29 MED ORDER — METOPROLOL SUCCINATE ER 25 MG PO TB24: 12.5000 mg | ORAL_TABLET | Freq: Every day | ORAL | 3 refills | Status: DC

## 2016-08-29 NOTE — Progress Notes (Signed)
BP 92/65   Pulse 66   Temp 97.6 F (36.4 C) (Oral)   Ht  (1.575 m)   Wt 200 lb (90.7 kg)   BMI 36.58 kg/m    Subjective:    Patient ID: Darlene [name], female    DOB: 01/15/1971, 46 y.o.   MRN: [id number]  HPI: Darlene [name] is a 46 y.o. female presenting on 08/29/2016 for Follow-up (1 month )  This patient comes in for periodic recheck on medications and conditions including tachycardia, hypertension. Reports doing well with medications and no issues. Needs some refills   All medications are reviewed today. There are no reports of any problems with the medications. All of the medical conditions are reviewed and updated.  Lab work is reviewed and will be ordered as medically necessary. There are no new problems reported with today's visit.   Relevant past medical, surgical, family and social history reviewed and updated as indicated. Allergies and medications reviewed and updated.  Past Medical History:  Diagnosis Date  . GERD (gastroesophageal reflux disease)   . Hiatal hernia   . Hypertension   . Hypokalemia   . OA (osteoarthritis)   . Plantar fasciitis   . Vitamin D deficiency     Past Surgical History:  Procedure Laterality Date  . CESAREAN SECTION  1993  . FOOT SURGERY Right 1997  . INSERTION OF MESH N/A 01/28/2016   Procedure: INSERTION OF MESH;  Surgeon: Axel Filler, MD;  Location: [facility];  Service: General;  Laterality: N/A;  . Right Knee Surgery    . tubal ligation      Review of Systems  Constitutional: Negative.  Negative for activity change, fatigue and fever.  HENT: Negative.   Eyes: Negative.   Respiratory: Negative.  Negative for cough.   Cardiovascular: Negative.  Negative for chest pain.  Gastrointestinal: Negative.  Negative for abdominal pain.  Endocrine: Negative.   Genitourinary: Negative.  Negative for dysuria.  Musculoskeletal: Negative.   Skin: Negative.   Neurological: Negative.     Allergies as of 08/29/2016   No Known  Allergies     Medication List       Accurate as of 08/29/16 10:32 PM. Always use your most recent med list.          Acetaminophen-Codeine 300-30 MG tablet Take 1 tablet by mouth every 8 (eight) hours as needed for pain.   Acetaminophen-Codeine 300-30 MG tablet Take 1 tablet by mouth every 8 (eight) hours as needed for pain.   acetaminophen-codeine 300-30 MG tablet Commonly known as:  TYLENOL #3 Take 1-2 tablets by mouth every 6 (six) hours as needed for moderate pain.   albuterol 108 (90 Base) MCG/ACT inhaler Commonly known as:  PROVENTIL HFA;VENTOLIN HFA Inhale 2 puffs into the lungs every 4 (four) hours as needed for wheezing or shortness of breath.   citalopram 20 MG tablet Commonly known as:  CELEXA Take 1 tablet (20 mg total) by mouth daily.   hydrochlorothiazide 25 MG tablet Commonly known as:  HYDRODIURIL Take 1 tablet (25 mg total) by mouth daily with breakfast.   ibuprofen 800 MG tablet Commonly known as:  ADVIL,MOTRIN Take 1 tablet (800 mg total) by mouth 3 (three) times daily as needed for headache.   KLOR-CON M20 20 MEQ tablet Generic drug:  potassium chloride SA TAKE 1 TABLET ONCE A DAY ORALLY 30 DAYS   loratadine 10 MG tablet Commonly known as:  CLARITIN Take 10 mg by mouth daily with breakfast.  metoprolol succinate 25 MG 24 hr tablet Commonly known as:  TOPROL-XL Take 0.5-1 tablets (12.5-25 mg total) by mouth daily.   Vitamin D (Ergocalciferol) 50000 units Caps capsule Commonly known as:  DRISDOL Take 1 capsule (50,000 Units total) by mouth 2 (two) times a week. Takes on Sunday's and Wednesday's          Objective:    BP 92/65   Pulse 66   Temp 97.6 F (36.4 C) (Oral)   Ht  (1.575 m)   Wt 200 lb (90.7 kg)   BMI 36.58 kg/m   No Known Allergies  Physical Exam  Constitutional: She is oriented to person, place, and time. She appears well-developed and well-nourished.  HENT:  Head: Normocephalic and atraumatic.  Eyes:  Conjunctivae and EOM are normal. Pupils are equal, round, and reactive to light.  Cardiovascular: Normal rate, regular rhythm, normal heart sounds and intact distal pulses.   Pulmonary/Chest: Effort normal and breath sounds normal.  Abdominal: Soft. Bowel sounds are normal.  Neurological: She is alert and oriented to person, place, and time. She has normal reflexes.  Skin: Skin is warm and dry. No rash noted.  Psychiatric: She has a normal mood and affect. Her behavior is normal. Judgment and thought content normal.  Nursing note and vitals reviewed.       Assessment & Plan:   1. Paroxysmal supraventricular tachycardia (HCC) - metoprolol succinate (TOPROL-XL) 25 MG 24 hr tablet; Take 0.5-1 tablets (12.5-25 mg total) by mouth daily.  Dispense: 90 tablet; Refill: 3   Current Outpatient Prescriptions:  .  acetaminophen-codeine (TYLENOL #3) 300-30 MG tablet, Take 1-2 tablets by mouth every 6 (six) hours as needed for moderate pain., Disp: 90 tablet, Rfl: 0 .  Acetaminophen-Codeine 300-30 MG tablet, Take 1 tablet by mouth every 8 (eight) hours as needed for pain., Disp: 90 tablet, Rfl: 0 .  Acetaminophen-Codeine 300-30 MG tablet, Take 1 tablet by mouth every 8 (eight) hours as needed for pain., Disp: 90 tablet, Rfl: 0 .  albuterol (PROVENTIL HFA;VENTOLIN HFA) 108 (90 Base) MCG/ACT inhaler, Inhale 2 puffs into the lungs every 4 (four) hours as needed for wheezing or shortness of breath., Disp: , Rfl:  .  citalopram (CELEXA) 20 MG tablet, Take 1 tablet (20 mg total) by mouth daily., Disp: 90 tablet, Rfl: 3 .  hydrochlorothiazide (HYDRODIURIL) 25 MG tablet, Take 1 tablet (25 mg total) by mouth daily with breakfast., Disp: 90 tablet, Rfl: 0 .  ibuprofen (ADVIL,MOTRIN) 800 MG tablet, Take 1 tablet (800 mg total) by mouth 3 (three) times daily as needed for headache., Disp: 270 tablet, Rfl: 0 .  KLOR-CON M20 20 MEQ tablet, TAKE 1 TABLET ONCE A DAY ORALLY 30 DAYS, Disp: 30 tablet, Rfl: 2 .  loratadine  (CLARITIN) 10 MG tablet, Take 10 mg by mouth daily with breakfast. , Disp: , Rfl:  .  metoprolol succinate (TOPROL-XL) 25 MG 24 hr tablet, Take 0.5-1 tablets (12.5-25 mg total) by mouth daily., Disp: 90 tablet, Rfl: 3 .  Vitamin D, Ergocalciferol, (DRISDOL) 50000 units CAPS capsule, Take 1 capsule (50,000 Units total) by mouth 2 (two) times a week. Takes on Sunday's and Wednesday's, Disp: 24 capsule, Rfl: 0  Continue all other maintenance medications as listed above.  Follow up plan: Return in about 3 months (around 11/28/2016) for recheck.  Educational handout given for tachycardia  Remus Loffler PA-C [facility] [address]  [place], [place] [address] [phone]   08/29/2016, 10:32 PM

## 2016-08-29 NOTE — Patient Instructions (Signed)
Supraventricular Tachycardia, Adult °Supraventricular tachycardia (SVT) is a kind of abnormal heartbeat. It makes your heart beat very fast and then beat at a normal speed. °A normal heart beats 60-100 times a minute. This condition can make your heart beat more than 150 times a minute. Times of having a fast heartbeat (episodes) can be scary, but they are usually not dangerous. They can lead to problems if: °· They happen often. °· They last a long time. °Symptoms of this condition include: °· A pounding heart. °· A feeling that your heart is skipping beats (palpitations). °· Weakness. °· Trouble getting enough air (shortness of breath). °· Pain or tightness in your chest. °· Feeling like you are going to pass out (light-headedness). °· Feeling worried or nervous (anxiety). °· Dizziness. °· Sweating. °· Feeling sick to your stomach (nausea). °· Passing out (fainting). °· Tiredness. °Sometimes, there are no symptoms. °Follow these instructions at home: °Stress  °· Avoid things that make you feel stressed. °· Find out what helps you feel less stressed. Try: °¨ Doing a relaxing activity, like yoga, meditation, or being out in nature. °¨ Listening to relaxing music. °¨ Doing relaxation techniques, like deep breathing. °¨ Taking steps to be healthy. These include getting lots of sleep, exercising, and eating a balanced diet. °¨ Talking with a mental health doctor. °Sleep  °· Try to get at least 7 hours of sleep each night. °Tobacco and nicotine  °· Do not use anything that has nicotine or tobacco, such as cigarettes and e-cigarettes. If you need help quitting, ask your doctor. °Alcohol  °· If alcohol gives you a fast heartbeat, do not drink alcohol. °· If alcohol does not seem to give you a fast heartbeat, limit your alcohol. For nonpregnant women, this means no more than 1 drink a day. For men, this means no more than 2 drinks a day. "One drink" means one of these: °¨ 12 oz of beer. °¨ 5 oz of wine. °¨ 1½ oz of hard  liquor. °Caffeine  °· If caffeine gives you a fast heartbeat, do not eat, drink, or use anything with caffeine in it. °· If caffeine does not seem to give you a fast heartbeat, limit how much caffeine you eat, drink, or use. °Stimulant drugs  °· Do not use stimulant drugs. These are drugs like cocaine or methamphetamine. If you need help quitting, ask your doctor. °General instructions  °· Stay at a healthy weight. °· Exercise regularly. Ask your doctor to suggest some good activities for you. Try one of these options: °¨ 150 minutes a week of gentle exercise, like walking or yoga. °¨ 75 minutes a week of exercise that is very active, like running or swimming. °¨ A combination of gentle exercise and very active exercise. °· Do home treatments to slow down your heartbeat as told by your doctor. °· Take over-the-counter and prescription medicines only as told by your doctor. °Contact a doctor if: °· You have a fast heartbeat more often. °· Times of having a fast heartbeat last longer than before. °· Your home treatments to slow down your heartbeat do not help. °· You have new symptoms. °Get help right away if: °· You have chest pain. °· Your symptoms get worse. °· You have trouble breathing. °· Your heart beats very fast for more than 20 minutes. °· You pass out (faint). °These symptoms may be an emergency. Do not wait to see if the symptoms will go away. Get medical help right away. Call your   local emergency services (911 in the [place] Do not drive yourself to the hospital. °This information is not intended to replace advice given to you by your health care provider. Make sure you discuss any questions you have with your health care provider. °Document Released: 05/01/2005 Document Revised: 01/06/2016 Document Reviewed: 01/06/2016 °Elsevier Interactive Patient Education © 2017 [employer] Inc. ° °

## 2016-09-10 ENCOUNTER — Other Ambulatory Visit: Payer: Self-pay | Admitting: Physician Assistant

## 2016-09-14 ENCOUNTER — Encounter: Payer: Self-pay | Admitting: Physician Assistant

## 2016-10-19 ENCOUNTER — Other Ambulatory Visit: Payer: Self-pay | Admitting: Physician Assistant

## 2016-10-19 ENCOUNTER — Encounter: Payer: Self-pay | Admitting: Physician Assistant

## 2016-10-31 ENCOUNTER — Other Ambulatory Visit: Payer: Self-pay | Admitting: Physician Assistant

## 2016-11-07 ENCOUNTER — Other Ambulatory Visit: Payer: Self-pay | Admitting: Family Medicine

## 2016-11-08 NOTE — Telephone Encounter (Signed)
No Vit D level in EPIC

## 2016-11-13 ENCOUNTER — Other Ambulatory Visit: Payer: Self-pay | Admitting: Physician Assistant

## 2016-11-28 ENCOUNTER — Encounter: Payer: Self-pay | Admitting: Physician Assistant

## 2016-11-28 ENCOUNTER — Ambulatory Visit (INDEPENDENT_AMBULATORY_CARE_PROVIDER_SITE_OTHER): Payer: BLUE CROSS/BLUE SHIELD | Admitting: Physician Assistant

## 2016-11-28 VITALS — BP 108/74 | HR 89 | Temp 97.0°F | Ht 62.0 in | Wt 211.6 lb

## 2016-11-28 DIAGNOSIS — M47816 Spondylosis without myelopathy or radiculopathy, lumbar region: Secondary | ICD-10-CM

## 2016-11-28 DIAGNOSIS — M722 Plantar fascial fibromatosis: Secondary | ICD-10-CM | POA: Diagnosis not present

## 2016-11-28 DIAGNOSIS — M159 Polyosteoarthritis, unspecified: Secondary | ICD-10-CM

## 2016-11-28 DIAGNOSIS — R5382 Chronic fatigue, unspecified: Secondary | ICD-10-CM | POA: Diagnosis not present

## 2016-11-28 MED ORDER — ACETAMINOPHEN-CODEINE 300-30 MG PO TABS: 1.0000 | ORAL_TABLET | Freq: Three times a day (TID) | ORAL | 0 refills | Status: DC | PRN

## 2016-11-28 MED ORDER — ESOMEPRAZOLE MAGNESIUM 40 MG PO CPDR: 40.0000 mg | DELAYED_RELEASE_CAPSULE | Freq: Every day | ORAL | 3 refills | Status: DC

## 2016-11-28 MED ORDER — DICLOFENAC SODIUM 75 MG PO TBEC: 75.0000 mg | DELAYED_RELEASE_TABLET | Freq: Two times a day (BID) | ORAL | 3 refills | Status: DC

## 2016-11-28 MED ORDER — ACETAMINOPHEN-CODEINE #3 300-30 MG PO TABS: 1.0000 | ORAL_TABLET | Freq: Four times a day (QID) | ORAL | 0 refills | Status: DC | PRN

## 2016-11-28 NOTE — Patient Instructions (Signed)
In a few days you may receive a survey in the mail or online from [employer] regarding your visit with us today. Please take a moment to fill this out. Your feedback is very important to our whole office. It can help us better understand your needs as well as improve your experience and satisfaction. Thank you for taking your time to complete it. We care about you.  Herold Salguero, PA-C  

## 2016-11-28 NOTE — Progress Notes (Signed)
BP 108/74   Pulse 89   Temp (!) 97 F (36.1 C) (Oral)   Ht '5\' 2"'  (1.575 m)   Wt 211 lb 9.6 oz (96 kg)   BMI 38.70 kg/m    Subjective:    Patient ID: Darlene [name], female    DOB: Jul 19, 1970, 46 y.o.   MRN: [id number]  HPI: Darlene [name] is a 46 y.o. female presenting on 11/28/2016 for Follow-up (3 month) and Fatigue  This patient comes in for periodic recheck on medications and conditions including Generalized osteoarthritis, plantar fasciitis, spondylosis of lumbar spine. She is having a flareup of her feet and left knee and left ankle. She had gone off the diclofenac last year due to some reflux symptoms. Her hiatal hernia is doing much better. She had it repaired. She wants to try the anti-inflammatory again. We are going to do that along with a proton pump inhibitor to concomitantly control acid. She will try to maintain on one a day of the diclofenac but to begin with. With twice a day to get this under control. The patient also is having excessive amount of fatigue. She has had problems in the past where she had hypokalemia. We will have labs drawn today..   All medications are reviewed today. There are no reports of any problems with the medications. All of the medical conditions are reviewed and updated.  Lab work is reviewed and will be ordered as medically necessary. There are no new problems reported with today's visit.   Relevant past medical, surgical, family and social history reviewed and updated as indicated. Allergies and medications reviewed and updated.  Past Medical History:  Diagnosis Date  . GERD (gastroesophageal reflux disease)   . Hiatal hernia   . Hypertension   . Hypokalemia   . OA (osteoarthritis)   . Plantar fasciitis   . Vitamin D deficiency     Past Surgical History:  Procedure Laterality Date  . CESAREAN SECTION  1993  . FOOT SURGERY Right 1997  . INSERTION OF MESH N/A 01/28/2016   Procedure: INSERTION OF MESH;  Surgeon: Ralene Ok, MD;   Location: [facility];  Service: General;  Laterality: N/A;  . Right Knee Surgery    . tubal ligation      Review of Systems  Constitutional: Positive for fatigue. Negative for activity change and fever.  HENT: Negative.   Eyes: Negative.   Respiratory: Negative.  Negative for cough.   Cardiovascular: Negative.  Negative for chest pain.  Gastrointestinal: Negative.  Negative for abdominal pain.  Endocrine: Negative.   Genitourinary: Negative.  Negative for dysuria.  Musculoskeletal: Positive for arthralgias, back pain, gait problem, joint swelling and myalgias.  Skin: Negative.     Allergies as of 11/28/2016   No Known Allergies     Medication List       Accurate as of 11/28/16  4:23 PM. Always use your most recent med list.          Acetaminophen-Codeine 300-30 MG tablet Take 1 tablet by mouth every 8 (eight) hours as needed for pain.   Acetaminophen-Codeine 300-30 MG tablet Take 1 tablet by mouth every 8 (eight) hours as needed for pain.   acetaminophen-codeine 300-30 MG tablet Commonly known as:  TYLENOL #3 Take 1-2 tablets by mouth every 6 (six) hours as needed for moderate pain.   citalopram 20 MG tablet Commonly known as:  CELEXA Take 1 tablet (20 mg total) by mouth daily.   diclofenac 75 MG EC tablet Commonly  known as:  VOLTAREN Take 1 tablet (75 mg total) by mouth 2 (two) times daily.   esomeprazole 40 MG capsule Commonly known as:  NEXIUM Take 1 capsule (40 mg total) by mouth daily at 12 noon.   hydrochlorothiazide 25 MG tablet Commonly known as:  HYDRODIURIL TAKE 1 TABLET (25 MG TOTAL) BY MOUTH DAILY WITH BREAKFAST.   ibuprofen 800 MG tablet Commonly known as:  ADVIL,MOTRIN Take 1 tablet (800 mg total) by mouth 3 (three) times daily as needed for headache.   KLOR-CON M20 20 MEQ tablet Generic drug:  potassium chloride SA TAKE 1 TABLET ONCE A DAY ORALLY 30 DAYS   loratadine 10 MG tablet Commonly known as:  CLARITIN TAKE 1 TABLET EVERY DAY FOR  ALLERGIES   metoprolol succinate 25 MG 24 hr tablet Commonly known as:  TOPROL-XL Take 0.5-1 tablets (12.5-25 mg total) by mouth daily.   PROAIR HFA 108 (90 Base) MCG/ACT inhaler Generic drug:  albuterol INHALE 2 PUFFS AS NEEDED EVERY 4 HOURS   Vitamin D (Ergocalciferol) 50000 units Caps capsule Commonly known as:  DRISDOL TAKE 1 CAPSULE (50,000 UNITS TOTAL) BY MOUTH 2 (TWO) TIMES A WEEK. TAKES ON SUNDAY'S AND WEDNESDAY'S          Objective:    BP 108/74   Pulse 89   Temp (!) 97 F (36.1 C) (Oral)   Ht '5\' 2"'  (1.575 m)   Wt 211 lb 9.6 oz (96 kg)   BMI 38.70 kg/m   No Known Allergies  Physical Exam  Constitutional: She is oriented to person, place, and time. She appears well-developed and well-nourished.  HENT:  Head: Normocephalic and atraumatic.  Eyes: Pupils are equal, round, and reactive to light. Conjunctivae and EOM are normal.  Cardiovascular: Normal rate, regular rhythm, normal heart sounds and intact distal pulses.   Pulmonary/Chest: Effort normal and breath sounds normal.  Abdominal: Soft. Bowel sounds are normal.  Musculoskeletal:       Lumbar back: She exhibits decreased range of motion, tenderness, pain and spasm.  Neurological: She is alert and oriented to person, place, and time. She has normal reflexes.  Skin: Skin is warm and dry. No rash noted.  Psychiatric: She has a normal mood and affect. Her behavior is normal. Judgment and thought content normal.        Assessment & Plan:   1. Generalized OA - Acetaminophen-Codeine 300-30 MG tablet; Take 1 tablet by mouth every 8 (eight) hours as needed for pain.  Dispense: 90 tablet; Refill: 0 - Acetaminophen-Codeine 300-30 MG tablet; Take 1 tablet by mouth every 8 (eight) hours as needed for pain.  Dispense: 90 tablet; Refill: 0 - acetaminophen-codeine (TYLENOL #3) 300-30 MG tablet; Take 1-2 tablets by mouth every 6 (six) hours as needed for moderate pain.  Dispense: 90 tablet; Refill: 0  2. Plantar  fasciitis - Acetaminophen-Codeine 300-30 MG tablet; Take 1 tablet by mouth every 8 (eight) hours as needed for pain.  Dispense: 90 tablet; Refill: 0 - Acetaminophen-Codeine 300-30 MG tablet; Take 1 tablet by mouth every 8 (eight) hours as needed for pain.  Dispense: 90 tablet; Refill: 0 - acetaminophen-codeine (TYLENOL #3) 300-30 MG tablet; Take 1-2 tablets by mouth every 6 (six) hours as needed for moderate pain.  Dispense: 90 tablet; Refill: 0  3. Spondylosis of lumbar region without myelopathy or radiculopathy - Acetaminophen-Codeine 300-30 MG tablet; Take 1 tablet by mouth every 8 (eight) hours as needed for pain.  Dispense: 90 tablet; Refill: 0 - Acetaminophen-Codeine 300-30 MG  tablet; Take 1 tablet by mouth every 8 (eight) hours as needed for pain.  Dispense: 90 tablet; Refill: 0 - acetaminophen-codeine (TYLENOL #3) 300-30 MG tablet; Take 1-2 tablets by mouth every 6 (six) hours as needed for moderate pain.  Dispense: 90 tablet; Refill: 0  4. Chronic fatigue - CMP14+EGFR - CBC with Differential/Platelet - TSH   Current Outpatient Prescriptions:  .  acetaminophen-codeine (TYLENOL #3) 300-30 MG tablet, Take 1-2 tablets by mouth every 6 (six) hours as needed for moderate pain., Disp: 90 tablet, Rfl: 0 .  Acetaminophen-Codeine 300-30 MG tablet, Take 1 tablet by mouth every 8 (eight) hours as needed for pain., Disp: 90 tablet, Rfl: 0 .  Acetaminophen-Codeine 300-30 MG tablet, Take 1 tablet by mouth every 8 (eight) hours as needed for pain., Disp: 90 tablet, Rfl: 0 .  citalopram (CELEXA) 20 MG tablet, Take 1 tablet (20 mg total) by mouth daily., Disp: 90 tablet, Rfl: 3 .  hydrochlorothiazide (HYDRODIURIL) 25 MG tablet, TAKE 1 TABLET (25 MG TOTAL) BY MOUTH DAILY WITH BREAKFAST., Disp: 90 tablet, Rfl: 0 .  ibuprofen (ADVIL,MOTRIN) 800 MG tablet, Take 1 tablet (800 mg total) by mouth 3 (three) times daily as needed for headache., Disp: 270 tablet, Rfl: 0 .  KLOR-CON M20 20 MEQ tablet, TAKE 1  TABLET ONCE A DAY ORALLY 30 DAYS, Disp: 30 tablet, Rfl: 3 .  loratadine (CLARITIN) 10 MG tablet, TAKE 1 TABLET EVERY DAY FOR ALLERGIES, Disp: 30 tablet, Rfl: 10 .  metoprolol succinate (TOPROL-XL) 25 MG 24 hr tablet, Take 0.5-1 tablets (12.5-25 mg total) by mouth daily., Disp: 90 tablet, Rfl: 3 .  PROAIR HFA 108 (90 Base) MCG/ACT inhaler, INHALE 2 PUFFS AS NEEDED EVERY 4 HOURS, Disp: 8.5 Inhaler, Rfl: 1 .  Vitamin D, Ergocalciferol, (DRISDOL) 50000 units CAPS capsule, TAKE 1 CAPSULE (50,000 UNITS TOTAL) BY MOUTH 2 (TWO) TIMES A WEEK. TAKES ON SUNDAY'S AND WEDNESDAY'S, Disp: 24 capsule, Rfl: 3 .  diclofenac (VOLTAREN) 75 MG EC tablet, Take 1 tablet (75 mg total) by mouth 2 (two) times daily., Disp: 180 tablet, Rfl: 3 .  esomeprazole (NEXIUM) 40 MG capsule, Take 1 capsule (40 mg total) by mouth daily at 12 noon., Disp: 90 capsule, Rfl: 3  Continue all other maintenance medications as listed above.  Follow up plan: Return in about 3 months (around 02/28/2017) for recheck.  Educational handout given for [place] PA-C [place] [address]  [place], [place] [address] [phone]   11/28/2016, 4:23 PM

## 2016-11-29 LAB — CBC WITH DIFFERENTIAL/PLATELET
BASOS ABS: 0 10*3/uL (ref 0.0–0.2)
BASOS: 0 %
EOS (ABSOLUTE): 0.2 10*3/uL (ref 0.0–0.4)
Eos: 2 %
HEMATOCRIT: 40 % (ref 34.0–46.6)
HEMOGLOBIN: 13 g/dL (ref 11.1–15.9)
IMMATURE GRANS (ABS): 0 10*3/uL (ref 0.0–0.1)
Immature Granulocytes: 0 %
LYMPHS ABS: 2.5 10*3/uL (ref 0.7–3.1)
LYMPHS: 30 %
MCH: 29.7 pg (ref 26.6–33.0)
MCHC: 32.5 g/dL (ref 31.5–35.7)
MCV: 92 fL (ref 79–97)
MONOCYTES: 5 %
Monocytes Absolute: 0.4 10*3/uL (ref 0.1–0.9)
NEUTROS ABS: 5.3 10*3/uL (ref 1.4–7.0)
Neutrophils: 63 %
Platelets: 295 10*3/uL (ref 150–379)
RBC: 4.37 x10E6/uL (ref 3.77–5.28)
RDW: 12.5 % (ref 12.3–15.4)
WBC: 8.5 10*3/uL (ref 3.4–10.8)

## 2016-11-29 LAB — CMP14+EGFR
ALBUMIN: 4.2 g/dL (ref 3.5–5.5)
ALT: 13 IU/L (ref 0–32)
AST: 15 IU/L (ref 0–40)
Albumin/Globulin Ratio: 1.4 (ref 1.2–2.2)
Alkaline Phosphatase: 95 IU/L (ref 39–117)
BUN / CREAT RATIO: 27 — AB (ref 9–23)
BUN: 19 mg/dL (ref 6–24)
Bilirubin Total: 0.2 mg/dL (ref 0.0–1.2)
CO2: 20 mmol/L (ref 20–29)
CREATININE: 0.71 mg/dL (ref 0.57–1.00)
Calcium: 9.3 mg/dL (ref 8.7–10.2)
Chloride: 101 mmol/L (ref 96–106)
GFR calc Af Amer: 120 mL/min/{1.73_m2} (ref >59)
GFR, EST NON AFRICAN AMERICAN: 104 mL/min/{1.73_m2} (ref >59)
GLOBULIN, TOTAL: 2.9 g/dL (ref 1.5–4.5)
GLUCOSE: 106 mg/dL — AB (ref 65–99)
Potassium: 3.5 mmol/L (ref 3.5–5.2)
SODIUM: 142 mmol/L (ref 134–144)
TOTAL PROTEIN: 7.1 g/dL (ref 6.0–8.5)

## 2016-11-29 LAB — TSH: TSH: 0.863 u[IU]/mL (ref 0.450–4.500)

## 2017-02-07 ENCOUNTER — Encounter: Payer: Self-pay | Admitting: Gastroenterology

## 2017-02-08 ENCOUNTER — Other Ambulatory Visit: Payer: Self-pay | Admitting: Physician Assistant

## 2017-02-19 ENCOUNTER — Other Ambulatory Visit: Payer: Self-pay | Admitting: Physician Assistant

## 2017-02-19 MED ORDER — IBUPROFEN 800 MG PO TABS: 800.0000 mg | ORAL_TABLET | Freq: Three times a day (TID) | ORAL | 0 refills | Status: DC | PRN

## 2017-02-28 ENCOUNTER — Ambulatory Visit (INDEPENDENT_AMBULATORY_CARE_PROVIDER_SITE_OTHER): Payer: BLUE CROSS/BLUE SHIELD | Admitting: Physician Assistant

## 2017-02-28 ENCOUNTER — Encounter: Payer: Self-pay | Admitting: Physician Assistant

## 2017-02-28 DIAGNOSIS — M722 Plantar fascial fibromatosis: Secondary | ICD-10-CM | POA: Diagnosis not present

## 2017-02-28 DIAGNOSIS — M47816 Spondylosis without myelopathy or radiculopathy, lumbar region: Secondary | ICD-10-CM | POA: Diagnosis not present

## 2017-02-28 DIAGNOSIS — M159 Polyosteoarthritis, unspecified: Secondary | ICD-10-CM | POA: Diagnosis not present

## 2017-02-28 DIAGNOSIS — Z23 Encounter for immunization: Secondary | ICD-10-CM | POA: Diagnosis not present

## 2017-02-28 MED ORDER — ACETAMINOPHEN-CODEINE 300-30 MG PO TABS: 1.0000 | ORAL_TABLET | Freq: Three times a day (TID) | ORAL | 0 refills | Status: DC | PRN

## 2017-02-28 MED ORDER — ACETAMINOPHEN-CODEINE #3 300-30 MG PO TABS: 1.0000 | ORAL_TABLET | Freq: Four times a day (QID) | ORAL | 0 refills | Status: DC | PRN

## 2017-02-28 NOTE — Progress Notes (Signed)
BP 102/67   Pulse 81   Temp 98.1 F (36.7 C) (Oral)   Ht 5\' 2"  (1.575 m)   Wt 219 lb (99.3 kg)   BMI 40.06 kg/m    Subjective:    Patient ID: Darlene [name], female    DOB: 1970-12-01, 46 y.o.   MRN: [id number]  HPI: Darlene [name] is a 46 y.o. female presenting on 02/28/2017 for Follow-up (3 month )  This patient comes in for periodic recheck on medications and conditions including Generalized osteoarthritis, plantar fasciitis and spondylosis of the lumbar spine. Overall she is doing very well. She is working a lot. Is having a lot of stress at work at times..   All medications are reviewed today. There are no reports of any problems with the medications. All of the medical conditions are reviewed and updated.  Lab work is reviewed and will be ordered as medically necessary. There are no new problems reported with today's visit.   Relevant past medical, surgical, family and social history reviewed and updated as indicated. Allergies and medications reviewed and updated.  Past Medical History:  Diagnosis Date  . GERD (gastroesophageal reflux disease)   . Hiatal hernia   . Hypertension   . Hypokalemia   . OA (osteoarthritis)   . Plantar fasciitis   . Vitamin D deficiency     Past Surgical History:  Procedure Laterality Date  . CESAREAN SECTION  1993  . FOOT SURGERY Right 1997  . INSERTION OF MESH N/A 01/28/2016   Procedure: INSERTION OF MESH;  Surgeon: Axel Filler, MD;  Location: [facility];  Service: General;  Laterality: N/A;  . Right Knee Surgery    . tubal ligation      Review of Systems  Constitutional: Negative.   HENT: Negative.   Eyes: Negative.   Respiratory: Negative.   Gastrointestinal: Negative.   Genitourinary: Negative.     Allergies as of 02/28/2017   No Known Allergies     Medication List       Accurate as of 02/28/17  4:33 PM. Always use your most recent med list.          Acetaminophen-Codeine 300-30 MG tablet Take 1 tablet by mouth every  8 (eight) hours as needed for pain.   Acetaminophen-Codeine 300-30 MG tablet Take 1 tablet by mouth every 8 (eight) hours as needed for pain.   acetaminophen-codeine 300-30 MG tablet Commonly known as:  TYLENOL #3 Take 1-2 tablets by mouth every 6 (six) hours as needed for moderate pain.   citalopram 20 MG tablet Commonly known as:  CELEXA Take 1 tablet (20 mg total) by mouth daily.   diclofenac 75 MG EC tablet Commonly known as:  VOLTAREN Take 1 tablet (75 mg total) by mouth 2 (two) times daily.   esomeprazole 40 MG capsule Commonly known as:  NEXIUM Take 1 capsule (40 mg total) by mouth daily at 12 noon.   hydrochlorothiazide 25 MG tablet Commonly known as:  HYDRODIURIL TAKE 1 TABLET (25 MG TOTAL) BY MOUTH DAILY WITH BREAKFAST.   ibuprofen 800 MG tablet Commonly known as:  ADVIL,MOTRIN TAKE 1 TABLET (800 MG TOTAL) BY MOUTH 3 (THREE) TIMES DAILY AS NEEDED FOR HEADACHE.   KLOR-CON M20 20 MEQ tablet Generic drug:  potassium chloride SA TAKE 1 TABLET ONCE A DAY ORALLY 30 DAYS   loratadine 10 MG tablet Commonly known as:  CLARITIN TAKE 1 TABLET EVERY DAY FOR ALLERGIES   metoprolol succinate 25 MG 24 hr tablet Commonly known as:  TOPROL-XL Take 0.5-1 tablets (12.5-25 mg total) by mouth daily.   PROAIR HFA 108 (90 Base) MCG/ACT inhaler Generic drug:  albuterol INHALE 2 PUFFS AS NEEDED EVERY 4 HOURS   Vitamin D (Ergocalciferol) 50000 units Caps capsule Commonly known as:  DRISDOL TAKE 1 CAPSULE (50,000 UNITS TOTAL) BY MOUTH 2 (TWO) TIMES A WEEK. TAKES ON SUNDAY'S AND WEDNESDAY'S          Objective:    BP 102/67   Pulse 81   Temp 98.1 F (36.7 C) (Oral)   Ht 5\' 2"  (1.575 m)   Wt 219 lb (99.3 kg)   BMI 40.06 kg/m   No Known Allergies  Physical Exam  Constitutional: She is oriented to person, place, and time. She appears well-developed and well-nourished.  HENT:  Head: Normocephalic and atraumatic.  Eyes: Pupils are equal, round, and reactive to light.  Conjunctivae and EOM are normal.  Cardiovascular: Normal rate, regular rhythm, normal heart sounds and intact distal pulses.   Pulmonary/Chest: Effort normal and breath sounds normal.  Abdominal: Soft. Bowel sounds are normal.  Neurological: She is alert and oriented to person, place, and time. She has normal reflexes.  Skin: Skin is warm and dry. No rash noted.  Psychiatric: She has a normal mood and affect. Her behavior is normal. Judgment and thought content normal.  Nursing note and vitals reviewed.       Assessment & Plan:   1. Need for immunization against influenza - Flu Vaccine QUAD 36+ mos IM  2. Generalized OA - Acetaminophen-Codeine 300-30 MG tablet; Take 1 tablet by mouth every 8 (eight) hours as needed for pain.  Dispense: 90 tablet; Refill: 0 - Acetaminophen-Codeine 300-30 MG tablet; Take 1 tablet by mouth every 8 (eight) hours as needed for pain.  Dispense: 90 tablet; Refill: 0 - acetaminophen-codeine (TYLENOL #3) 300-30 MG tablet; Take 1-2 tablets by mouth every 6 (six) hours as needed for moderate pain.  Dispense: 90 tablet; Refill: 0  3. Plantar fasciitis - Acetaminophen-Codeine 300-30 MG tablet; Take 1 tablet by mouth every 8 (eight) hours as needed for pain.  Dispense: 90 tablet; Refill: 0 - Acetaminophen-Codeine 300-30 MG tablet; Take 1 tablet by mouth every 8 (eight) hours as needed for pain.  Dispense: 90 tablet; Refill: 0 - acetaminophen-codeine (TYLENOL #3) 300-30 MG tablet; Take 1-2 tablets by mouth every 6 (six) hours as needed for moderate pain.  Dispense: 90 tablet; Refill: 0  4. Spondylosis of lumbar region without myelopathy or radiculopathy - Acetaminophen-Codeine 300-30 MG tablet; Take 1 tablet by mouth every 8 (eight) hours as needed for pain.  Dispense: 90 tablet; Refill: 0 - Acetaminophen-Codeine 300-30 MG tablet; Take 1 tablet by mouth every 8 (eight) hours as needed for pain.  Dispense: 90 tablet; Refill: 0 - acetaminophen-codeine (TYLENOL #3)  300-30 MG tablet; Take 1-2 tablets by mouth every 6 (six) hours as needed for moderate pain.  Dispense: 90 tablet; Refill: 0    Current Outpatient Prescriptions:  .  acetaminophen-codeine (TYLENOL #3) 300-30 MG tablet, Take 1-2 tablets by mouth every 6 (six) hours as needed for moderate pain., Disp: 90 tablet, Rfl: 0 .  Acetaminophen-Codeine 300-30 MG tablet, Take 1 tablet by mouth every 8 (eight) hours as needed for pain., Disp: 90 tablet, Rfl: 0 .  Acetaminophen-Codeine 300-30 MG tablet, Take 1 tablet by mouth every 8 (eight) hours as needed for pain., Disp: 90 tablet, Rfl: 0 .  citalopram (CELEXA) 20 MG tablet, Take 1 tablet (20 mg total) by mouth daily.,  Disp: 90 tablet, Rfl: 3 .  diclofenac (VOLTAREN) 75 MG EC tablet, Take 1 tablet (75 mg total) by mouth 2 (two) times daily., Disp: 180 tablet, Rfl: 3 .  esomeprazole (NEXIUM) 40 MG capsule, Take 1 capsule (40 mg total) by mouth daily at 12 noon., Disp: 90 capsule, Rfl: 3 .  hydrochlorothiazide (HYDRODIURIL) 25 MG tablet, TAKE 1 TABLET (25 MG TOTAL) BY MOUTH DAILY WITH BREAKFAST., Disp: 90 tablet, Rfl: 0 .  ibuprofen (ADVIL,MOTRIN) 800 MG tablet, TAKE 1 TABLET (800 MG TOTAL) BY MOUTH 3 (THREE) TIMES DAILY AS NEEDED FOR HEADACHE., Disp: 270 tablet, Rfl: 0 .  KLOR-CON M20 20 MEQ tablet, TAKE 1 TABLET ONCE A DAY ORALLY 30 DAYS, Disp: 30 tablet, Rfl: 3 .  loratadine (CLARITIN) 10 MG tablet, TAKE 1 TABLET EVERY DAY FOR ALLERGIES, Disp: 30 tablet, Rfl: 10 .  metoprolol succinate (TOPROL-XL) 25 MG 24 hr tablet, Take 0.5-1 tablets (12.5-25 mg total) by mouth daily., Disp: 90 tablet, Rfl: 3 .  PROAIR HFA 108 (90 Base) MCG/ACT inhaler, INHALE 2 PUFFS AS NEEDED EVERY 4 HOURS, Disp: 8.5 Inhaler, Rfl: 1 .  Vitamin D, Ergocalciferol, (DRISDOL) 50000 units CAPS capsule, TAKE 1 CAPSULE (50,000 UNITS TOTAL) BY MOUTH 2 (TWO) TIMES A WEEK. TAKES ON SUNDAY'S AND WEDNESDAY'S, Disp: 24 capsule, Rfl: 3 Continue all other maintenance medications as listed  above.  Follow up plan: Return in about 3 months (around 05/31/2017).  Educational handout given for survey  Remus Loffler PA-C [facility] [address]  [place], [place] [address] [phone]   02/28/2017, 4:33 PM

## 2017-02-28 NOTE — Patient Instructions (Signed)
In a few days you may receive a survey in the mail or online from [employer] regarding your visit with us today. Please take a moment to fill this out. Your feedback is very important to our whole office. It can help us better understand your needs as well as improve your experience and satisfaction. Thank you for taking your time to complete it. We care about you.  Dyami Umbach, PA-C  

## 2017-03-11 ENCOUNTER — Other Ambulatory Visit: Payer: Self-pay | Admitting: Physician Assistant

## 2017-03-23 ENCOUNTER — Ambulatory Visit: Payer: BLUE CROSS/BLUE SHIELD | Admitting: Physician Assistant

## 2017-03-23 ENCOUNTER — Encounter: Payer: Self-pay | Admitting: Physician Assistant

## 2017-03-23 DIAGNOSIS — F959 Tic disorder, unspecified: Secondary | ICD-10-CM | POA: Diagnosis not present

## 2017-03-23 NOTE — Patient Instructions (Signed)
Tic Disorders A tic disorder is a condition in which a person makes sudden and repeated movements or sounds (tics). There are three types of tic disorders:  Transient or provisional tic disorder (common). This type usually goes away within a year or two.  Chronic or persistent tic disorder. This type may last all through childhood and continue into the adult years.  Tourette syndrome (rare). This type lasts through all of life. It often occurs with other disorders.  Tic disorders starts before age 18, usually between age of 5 and 10. These disorders cannot be cured, but there are many treatments that can help manage tics. Most tic disorders get better over time. What are the causes? The cause of this condition is not known. What are the signs or symptoms? The main symptom of this condition is experiencing tics. There are four type of tics:  Simple motor tics. These are movements in one area of the body.  Complex motor tics. These are movements in large areas or in several areas of the body.  Simple vocal tics. These are single sounds.  Complex vocal tics. These are sounds that include several words or phrases.  Tics range in severity and may be more severe when you are stressed or tired. Tics can change over time. Symptoms of simple motor tics  Blinking, squinting, or eyebrow raising.  Nose wrinkling.  Mouth twitching, grimacing, or making tongue movements.  Head nodding or twisting.  Shoulder shrugging.  Arm jerking.  Foot shaking. Symptoms of complex motor tics  Grooming behavior, such as combing one's hair.  Smelling objects.  Jumping.  Imitating others' behavior.  Making rude or obscene gestures. Symptoms of simple vocal tics  Coughing.  Humming.  Throat clearing.  Grunting.  Yawning.  Sniffing.  Barking.  Snorting. Symptoms of complex vocal tics  Imitating what others say.  Saying words and sentences that may: ? Seem out of context. ? Be  rude. How is this diagnosed? This condition is diagnosed based on:  Your symptoms.  Your medical history.  A physical exam.  An exam of your nervous system (neurological exam).  Tests. These may be done to rule out other conditions that cause symptoms like tics. Tests may include: ? Blood tests. ? Brain imaging tests.  Your health care provider will ask you about:  The type of tics you have.  When the tics started and how often they happen.  How the tics affect your daily activities.  Other medical issues you may have.  Whether you take over-the-counter or prescription medicines.  Whether you use any drugs.  You may be referred to a brain and nerve specialist (neurologist) or a mental health specialist for further evaluation. How is this treated? Treatment for this condition depends on how severe your tics are. If they are mild, you may not need treatment. If they are more severe, you may benefit from treatment. Some treatments include:  Cognitive behavioral therapy. This kind of therapy involves talking to a mental health professional. The therapist can help you to: ? Become more aware of your tics. ? Learn ways to control your tics. ? Know how to disguise your tics.  Family therapy. This kind of therapy provides education and emotional support for your family members.  Medicine that helps to control tics.  Medicine that is injected into the body to relax muscles (botulinum toxin). This may be a treatment option if your tics are severe.  Electrical stimulation of the brain (deep brain stimulation). This   may be a treatment option if your tics are severe.  Follow these instructions at home:  Take over-the-counter and prescription medicines only as told by your health care provider.  Check with your health care provider before using any new prescription or over-the-counter medicines.  Keep all follow-up visits as told by your health care provider. This is  important. Contact a health care provider if:  You are not able to take your medicines as prescribed.  Your symptoms get worse.  Your symptoms are interfering with your ability to function normally at home, work, or school.  You have new or unusual symptoms like pain or weakness.  Your symptoms make you feel depressed or anxious. Summary  A tic disorder is a condition in which a person makes sudden and repeated movements or sounds.  Tic disorders start before age 18, usually between the age of 5 and 10.  Many tic disorders are mild and do not need treatment.  These disorders cannot be cured, but there are many treatments that can help manage tics. This information is not intended to replace advice given to you by your health care provider. Make sure you discuss any questions you have with your health care provider. Document Released: 01/01/2013 Document Revised: 05/19/2016 Document Reviewed: 05/19/2016 Elsevier Interactive Patient Education  2017 [employer]  

## 2017-03-26 NOTE — Progress Notes (Signed)
BP 107/76   Pulse 78   Temp 97.6 F (36.4 C) (Oral)   Ht '5\' 2"'  (1.575 m)   Wt 223 lb 9.6 oz (101.4 kg)   BMI 40.90 kg/m    Subjective:    Patient ID: Darlene [name], female    DOB: [birth year], 46 y.o.   MRN: [id number]  HPI: Darlene [name] is a 46 y.o. female presenting on 03/23/2017 for shoulder twitch (also neck and face. Patient states it has been going on for years and it is getting worse)  This patient comes in today reporting chronic condition of neck and shoulder twitching.  We have discussed that no one in her family has ever had any type of tic disorder or Tourette's disease.  She states that she has uncontrollable urge to move the shoulder and neck.  I have encouraged her to let this happen while she is at home.  Due to the chronicity of it I like for her to see a neurologist as soon as possible.  Relevant past medical, surgical, family and social history reviewed and updated as indicated. Allergies and medications reviewed and updated.  Past Medical History:  Diagnosis Date  . GERD (gastroesophageal reflux disease)   . Hiatal hernia   . Hypertension   . Hypokalemia   . OA (osteoarthritis)   . Plantar fasciitis   . Vitamin D deficiency     Past Surgical History:  Procedure Laterality Date  . CESAREAN SECTION  1993  . FOOT SURGERY Right 1997  . Right Knee Surgery    . tubal ligation      Review of Systems  Constitutional: Negative.   HENT: Negative.   Eyes: Negative.   Respiratory: Negative.   Gastrointestinal: Negative.   Genitourinary: Negative.     Allergies as of 03/23/2017   No Known Allergies     Medication List        Accurate as of 03/23/17 11:59 PM. Always use your most recent med list.          Acetaminophen-Codeine 300-30 MG tablet Take 1 tablet by mouth every 8 (eight) hours as needed for pain.   Acetaminophen-Codeine 300-30 MG tablet Take 1 tablet by mouth every 8 (eight) hours as needed for pain.   acetaminophen-codeine 300-30 MG  tablet Commonly known as:  TYLENOL #3 Take 1-2 tablets by mouth every 6 (six) hours as needed for moderate pain.   citalopram 20 MG tablet Commonly known as:  CELEXA Take 1 tablet (20 mg total) by mouth daily.   diclofenac 75 MG EC tablet Commonly known as:  VOLTAREN Take 1 tablet (75 mg total) by mouth 2 (two) times daily.   esomeprazole 40 MG capsule Commonly known as:  NEXIUM Take 1 capsule (40 mg total) by mouth daily at 12 noon.   hydrochlorothiazide 25 MG tablet Commonly known as:  HYDRODIURIL TAKE 1 TABLET (25 MG TOTAL) BY MOUTH DAILY WITH BREAKFAST.   ibuprofen 800 MG tablet Commonly known as:  ADVIL,MOTRIN TAKE 1 TABLET (800 MG TOTAL) BY MOUTH 3 (THREE) TIMES DAILY AS NEEDED FOR HEADACHE.   KLOR-CON M20 20 MEQ tablet Generic drug:  potassium chloride SA TAKE 1 TABLET ONCE A DAY ORALLY 30 DAYS   loratadine 10 MG tablet Commonly known as:  CLARITIN TAKE 1 TABLET EVERY DAY FOR ALLERGIES   metoprolol succinate 25 MG 24 hr tablet Commonly known as:  TOPROL-XL Take 0.5-1 tablets (12.5-25 mg total) by mouth daily.   PROAIR HFA 108 (90 Base) MCG/ACT inhaler  Generic drug:  albuterol INHALE 2 PUFFS AS NEEDED EVERY 4 HOURS   Vitamin D (Ergocalciferol) 50000 units Caps capsule Commonly known as:  DRISDOL TAKE 1 CAPSULE (50,000 UNITS TOTAL) BY MOUTH 2 (TWO) TIMES A WEEK. TAKES ON SUNDAY'S AND WEDNESDAY'S          Objective:    BP 107/76   Pulse 78   Temp 97.6 F (36.4 C) (Oral)   Ht '5\' 2"'  (1.575 m)   Wt 223 lb 9.6 oz (101.4 kg)   BMI 40.90 kg/m   No Known Allergies  Physical Exam  Constitutional: She is oriented to person, place, and time. She appears well-developed and well-nourished.  HENT:  Head: Normocephalic and atraumatic.  Right Ear: Tympanic membrane, external ear and ear canal normal.  Left Ear: Tympanic membrane, external ear and ear canal normal.  Nose: Nose normal. No rhinorrhea.  Mouth/Throat: Oropharynx is clear and moist and mucous  membranes are normal. No oropharyngeal exudate or posterior oropharyngeal erythema.  Eyes: Conjunctivae and EOM are normal. Pupils are equal, round, and reactive to light.  Neck: Normal range of motion. Neck supple.  Cardiovascular: Normal rate, regular rhythm, normal heart sounds and intact distal pulses.  Pulmonary/Chest: Effort normal and breath sounds normal.  Abdominal: Soft. Bowel sounds are normal.  Neurological: She is alert and oriented to person, place, and time. She has normal reflexes.  Skin: Skin is warm and dry. No rash noted.  Psychiatric: She has a normal mood and affect. Her behavior is normal. Judgment and thought content normal.  Nursing note and vitals reviewed.   Results for orders placed or performed in visit on 11/28/16  CMP14+EGFR  Result Value Ref Range   Glucose 106 (H) 65 - 99 mg/dL   BUN 19 6 - 24 mg/dL   Creatinine, Ser 0.71 0.57 - 1.00 mg/dL   GFR calc non Af Amer 104 >59 mL/min/1.73   GFR calc Af Amer 120 >59 mL/min/1.73   BUN/Creatinine Ratio 27 (H) 9 - 23   Sodium 142 134 - 144 mmol/L   Potassium 3.5 3.5 - 5.2 mmol/L   Chloride 101 96 - 106 mmol/L   CO2 20 20 - 29 mmol/L   Calcium 9.3 8.7 - 10.2 mg/dL   Total Protein 7.1 6.0 - 8.5 g/dL   Albumin 4.2 3.5 - 5.5 g/dL   Globulin, Total 2.9 1.5 - 4.5 g/dL   Albumin/Globulin Ratio 1.4 1.2 - 2.2   Bilirubin Total 0.2 0.0 - 1.2 mg/dL   Alkaline Phosphatase 95 39 - 117 IU/L   AST 15 0 - 40 IU/L   ALT 13 0 - 32 IU/L  CBC with Differential/Platelet  Result Value Ref Range   WBC 8.5 3.4 - 10.8 x10E3/uL   RBC 4.37 3.77 - 5.28 x10E6/uL   Hemoglobin 13.0 11.1 - 15.9 g/dL   Hematocrit 40.0 34.0 - 46.6 %   MCV 92 79 - 97 fL   MCH 29.7 26.6 - 33.0 pg   MCHC 32.5 31.5 - 35.7 g/dL   RDW 12.5 12.3 - 15.4 %   Platelets 295 150 - 379 x10E3/uL   Neutrophils 63 Not Estab. %   Lymphs 30 Not Estab. %   Monocytes 5 Not Estab. %   Eos 2 Not Estab. %   Basos 0 Not Estab. %   Neutrophils Absolute 5.3 1.4 - 7.0  x10E3/uL   Lymphocytes Absolute 2.5 0.7 - 3.1 x10E3/uL   Monocytes Absolute 0.4 0.1 - 0.9 x10E3/uL   EOS (ABSOLUTE)  0.2 0.0 - 0.4 x10E3/uL   Basophils Absolute 0.0 0.0 - 0.2 x10E3/uL   Immature Granulocytes 0 Not Estab. %   Immature Grans (Abs) 0.0 0.0 - 0.1 x10E3/uL  TSH  Result Value Ref Range   TSH 0.863 0.450 - 4.500 uIU/mL      Assessment & Plan:   1. Tic disorder - Ambulatory referral to Neurology    Current Outpatient Medications:  .  acetaminophen-codeine (TYLENOL #3) 300-30 MG tablet, Take 1-2 tablets by mouth every 6 (six) hours as needed for moderate pain., Disp: 90 tablet, Rfl: 0 .  Acetaminophen-Codeine 300-30 MG tablet, Take 1 tablet by mouth every 8 (eight) hours as needed for pain., Disp: 90 tablet, Rfl: 0 .  Acetaminophen-Codeine 300-30 MG tablet, Take 1 tablet by mouth every 8 (eight) hours as needed for pain., Disp: 90 tablet, Rfl: 0 .  citalopram (CELEXA) 20 MG tablet, Take 1 tablet (20 mg total) by mouth daily., Disp: 90 tablet, Rfl: 3 .  diclofenac (VOLTAREN) 75 MG EC tablet, Take 1 tablet (75 mg total) by mouth 2 (two) times daily., Disp: 180 tablet, Rfl: 3 .  esomeprazole (NEXIUM) 40 MG capsule, Take 1 capsule (40 mg total) by mouth daily at 12 noon., Disp: 90 capsule, Rfl: 3 .  hydrochlorothiazide (HYDRODIURIL) 25 MG tablet, TAKE 1 TABLET (25 MG TOTAL) BY MOUTH DAILY WITH BREAKFAST., Disp: 90 tablet, Rfl: 0 .  ibuprofen (ADVIL,MOTRIN) 800 MG tablet, TAKE 1 TABLET (800 MG TOTAL) BY MOUTH 3 (THREE) TIMES DAILY AS NEEDED FOR HEADACHE., Disp: 270 tablet, Rfl: 0 .  KLOR-CON M20 20 MEQ tablet, TAKE 1 TABLET ONCE A DAY ORALLY 30 DAYS, Disp: 90 tablet, Rfl: 0 .  loratadine (CLARITIN) 10 MG tablet, TAKE 1 TABLET EVERY DAY FOR ALLERGIES, Disp: 30 tablet, Rfl: 10 .  metoprolol succinate (TOPROL-XL) 25 MG 24 hr tablet, Take 0.5-1 tablets (12.5-25 mg total) by mouth daily., Disp: 90 tablet, Rfl: 3 .  PROAIR HFA 108 (90 Base) MCG/ACT inhaler, INHALE 2 PUFFS AS NEEDED EVERY 4  HOURS, Disp: 8.5 Inhaler, Rfl: 1 .  Vitamin D, Ergocalciferol, (DRISDOL) 50000 units CAPS capsule, TAKE 1 CAPSULE (50,000 UNITS TOTAL) BY MOUTH 2 (TWO) TIMES A WEEK. TAKES ON SUNDAY'S AND WEDNESDAY'S, Disp: 24 capsule, Rfl: 3 Continue all other maintenance medications as listed above.  Follow up plan: Return if symptoms worsen or fail to improve, for keep follow up.  Educational handout given for [place] PA-C [facility] [address]  [place], [place] [address] [phone]   03/26/2017, 12:17 PM

## 2017-03-28 ENCOUNTER — Encounter: Payer: Self-pay | Admitting: Physician Assistant

## 2017-05-09 ENCOUNTER — Other Ambulatory Visit: Payer: Self-pay | Admitting: Physician Assistant

## 2017-05-16 DIAGNOSIS — F959 Tic disorder, unspecified: Secondary | ICD-10-CM | POA: Diagnosis not present

## 2017-05-17 DIAGNOSIS — F959 Tic disorder, unspecified: Secondary | ICD-10-CM | POA: Diagnosis not present

## 2017-05-18 ENCOUNTER — Other Ambulatory Visit: Payer: Self-pay

## 2017-05-18 MED ORDER — IBUPROFEN 800 MG PO TABS: 800.0000 mg | ORAL_TABLET | Freq: Three times a day (TID) | ORAL | 0 refills | Status: DC | PRN

## 2017-06-01 ENCOUNTER — Encounter: Payer: Self-pay | Admitting: Physician Assistant

## 2017-06-01 ENCOUNTER — Ambulatory Visit: Payer: BLUE CROSS/BLUE SHIELD | Admitting: Physician Assistant

## 2017-06-01 DIAGNOSIS — M47816 Spondylosis without myelopathy or radiculopathy, lumbar region: Secondary | ICD-10-CM

## 2017-06-01 DIAGNOSIS — M722 Plantar fascial fibromatosis: Secondary | ICD-10-CM | POA: Diagnosis not present

## 2017-06-01 DIAGNOSIS — M159 Polyosteoarthritis, unspecified: Secondary | ICD-10-CM

## 2017-06-01 DIAGNOSIS — F411 Generalized anxiety disorder: Secondary | ICD-10-CM | POA: Diagnosis not present

## 2017-06-01 MED ORDER — ACETAMINOPHEN-CODEINE #3 300-30 MG PO TABS: 1.0000 | ORAL_TABLET | Freq: Four times a day (QID) | ORAL | 0 refills | Status: DC | PRN

## 2017-06-01 MED ORDER — ACETAMINOPHEN-CODEINE 300-30 MG PO TABS: 1.0000 | ORAL_TABLET | Freq: Three times a day (TID) | ORAL | 0 refills | Status: DC | PRN

## 2017-06-01 MED ORDER — POTASSIUM CHLORIDE CRYS ER 20 MEQ PO TBCR: EXTENDED_RELEASE_TABLET | ORAL | 3 refills | Status: DC

## 2017-06-01 MED ORDER — HYDROCHLOROTHIAZIDE 25 MG PO TABS: 25.0000 mg | ORAL_TABLET | Freq: Every day | ORAL | 3 refills | Status: DC

## 2017-06-01 MED ORDER — CITALOPRAM HYDROBROMIDE 20 MG PO TABS: 20.0000 mg | ORAL_TABLET | Freq: Every day | ORAL | 3 refills | Status: DC

## 2017-06-01 NOTE — Progress Notes (Signed)
BP 112/73   Pulse 71   Temp 98 F (36.7 C) (Oral)   Ht '5\' 2"'  (1.575 m)   Wt 231 lb 6.4 oz (105 kg)   BMI 42.32 kg/m    Subjective:    Patient ID: Darlene [name], female    DOB: [birth year], 47 y.o.   MRN: [id number]  HPI: Darlene [name] is a 47 y.o. female presenting on 06/01/2017 for Follow-up (3  month ) This patient comes in for periodic recheck on medications and conditions including spondylosis, osteoarthritis, plantar fasciitis, generalized anxiety.  All of her medications are reviewed today refills will be sent..   All medications are reviewed today. There are no reports of any problems with the medications. All of the medical conditions are reviewed and updated.  Lab work is reviewed and will be ordered as medically necessary. There are no new problems reported with today's visit.    Relevant past medical, surgical, family and social history reviewed and updated as indicated. Allergies and medications reviewed and updated.  Past Medical History:  Diagnosis Date  . GERD (gastroesophageal reflux disease)   . Hiatal hernia   . Hypertension   . Hypokalemia   . OA (osteoarthritis)   . Plantar fasciitis   . Vitamin D deficiency     Past Surgical History:  Procedure Laterality Date  . CESAREAN SECTION  1993  . FOOT SURGERY Right 1997  . INSERTION OF MESH N/A 01/28/2016   Procedure: INSERTION OF MESH;  Surgeon: Ralene Ok, MD;  Location: [facility];  Service: General;  Laterality: N/A;  . Right Knee Surgery    . tubal ligation      Review of Systems  Constitutional: Negative.  Negative for activity change, fatigue and fever.  HENT: Negative.   Eyes: Negative.   Respiratory: Negative.  Negative for cough.   Cardiovascular: Negative.  Negative for chest pain.  Gastrointestinal: Negative.  Negative for abdominal pain.  Endocrine: Negative.   Genitourinary: Negative.  Negative for dysuria.  Musculoskeletal: Negative.   Skin: Negative.   Neurological: Negative.      Allergies as of 06/01/2017   No Known Allergies     Medication List        Accurate as of 06/01/17  3:56 PM. Always use your most recent med list.          acetaminophen-codeine 300-30 MG tablet Commonly known as:  TYLENOL #3 Take 1-2 tablets by mouth every 6 (six) hours as needed for moderate pain.   Acetaminophen-Codeine 300-30 MG tablet Take 1 tablet by mouth every 8 (eight) hours as needed for pain.   Acetaminophen-Codeine 300-30 MG tablet Take 1 tablet by mouth every 8 (eight) hours as needed for pain.   citalopram 20 MG tablet Commonly known as:  CELEXA Take 1 tablet (20 mg total) by mouth daily.   diclofenac 75 MG EC tablet Commonly known as:  VOLTAREN Take 1 tablet (75 mg total) by mouth 2 (two) times daily.   esomeprazole 40 MG capsule Commonly known as:  NEXIUM Take 1 capsule (40 mg total) by mouth daily at 12 noon.   hydrochlorothiazide 25 MG tablet Commonly known as:  HYDRODIURIL Take 1 tablet (25 mg total) by mouth daily with breakfast.   ibuprofen 800 MG tablet Commonly known as:  ADVIL,MOTRIN Take 1 tablet (800 mg total) by mouth 3 (three) times daily as needed for headache.   loratadine 10 MG tablet Commonly known as:  CLARITIN TAKE 1 TABLET EVERY DAY FOR ALLERGIES  metoprolol succinate 25 MG 24 hr tablet Commonly known as:  TOPROL-XL Take 0.5-1 tablets (12.5-25 mg total) by mouth daily.   potassium chloride SA 20 MEQ tablet Commonly known as:  KLOR-CON M20 TAKE 1 TABLET ONCE A DAY ORALLY 30 DAYS   PROAIR HFA 108 (90 Base) MCG/ACT inhaler Generic drug:  albuterol INHALE 2 PUFFS AS NEEDED EVERY 4 HOURS   Vitamin D (Ergocalciferol) 50000 units Caps capsule Commonly known as:  DRISDOL TAKE 1 CAPSULE (50,000 UNITS TOTAL) BY MOUTH 2 (TWO) TIMES A WEEK. TAKES ON SUNDAY'S AND WEDNESDAY'S          Objective:    BP 112/73   Pulse 71   Temp 98 F (36.7 C) (Oral)   Ht '5\' 2"'  (1.575 m)   Wt 231 lb 6.4 oz (105 kg)   BMI 42.32 kg/m    No Known Allergies  Physical Exam  Constitutional: She is oriented to person, place, and time. She appears well-developed and well-nourished.  HENT:  Head: Normocephalic and atraumatic.  Right Ear: Tympanic membrane, external ear and ear canal normal.  Left Ear: Tympanic membrane, external ear and ear canal normal.  Nose: Nose normal. No rhinorrhea.  Mouth/Throat: Oropharynx is clear and moist and mucous membranes are normal. No oropharyngeal exudate or posterior oropharyngeal erythema.  Eyes: Conjunctivae and EOM are normal. Pupils are equal, round, and reactive to light.  Neck: Normal range of motion. Neck supple.  Cardiovascular: Normal rate, regular rhythm, normal heart sounds and intact distal pulses.  Pulmonary/Chest: Effort normal and breath sounds normal.  Abdominal: Soft. Bowel sounds are normal.  Neurological: She is alert and oriented to person, place, and time. She has normal reflexes.  Skin: Skin is warm and dry. No rash noted.  Psychiatric: She has a normal mood and affect. Her behavior is normal. Judgment and thought content normal.    Results for orders placed or performed in visit on 11/28/16  CMP14+EGFR  Result Value Ref Range   Glucose 106 (H) 65 - 99 mg/dL   BUN 19 6 - 24 mg/dL   Creatinine, Ser 0.71 0.57 - 1.00 mg/dL   GFR calc non Af Amer 104 >59 mL/min/1.73   GFR calc Af Amer 120 >59 mL/min/1.73   BUN/Creatinine Ratio 27 (H) 9 - 23   Sodium 142 134 - 144 mmol/L   Potassium 3.5 3.5 - 5.2 mmol/L   Chloride 101 96 - 106 mmol/L   CO2 20 20 - 29 mmol/L   Calcium 9.3 8.7 - 10.2 mg/dL   Total Protein 7.1 6.0 - 8.5 g/dL   Albumin 4.2 3.5 - 5.5 g/dL   Globulin, Total 2.9 1.5 - 4.5 g/dL   Albumin/Globulin Ratio 1.4 1.2 - 2.2   Bilirubin Total 0.2 0.0 - 1.2 mg/dL   Alkaline Phosphatase 95 39 - 117 IU/L   AST 15 0 - 40 IU/L   ALT 13 0 - 32 IU/L  CBC with Differential/Platelet  Result Value Ref Range   WBC 8.5 3.4 - 10.8 x10E3/uL   RBC 4.37 3.77 - 5.28  x10E6/uL   Hemoglobin 13.0 11.1 - 15.9 g/dL   Hematocrit 40.0 34.0 - 46.6 %   MCV 92 79 - 97 fL   MCH 29.7 26.6 - 33.0 pg   MCHC 32.5 31.5 - 35.7 g/dL   RDW 12.5 12.3 - 15.4 %   Platelets 295 150 - 379 x10E3/uL   Neutrophils 63 Not Estab. %   Lymphs 30 Not Estab. %   Monocytes 5  Not Estab. %   Eos 2 Not Estab. %   Basos 0 Not Estab. %   Neutrophils Absolute 5.3 1.4 - 7.0 x10E3/uL   Lymphocytes Absolute 2.5 0.7 - 3.1 x10E3/uL   Monocytes Absolute 0.4 0.1 - 0.9 x10E3/uL   EOS (ABSOLUTE) 0.2 0.0 - 0.4 x10E3/uL   Basophils Absolute 0.0 0.0 - 0.2 x10E3/uL   Immature Granulocytes 0 Not Estab. 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  Dispense: 90 tablet; Refill: 0 - Acetaminophen-Codeine 300-30 MG tablet; Take 1 tablet by mouth every 8 (eight) hours as needed for pain.  Dispense: 90 tablet; Refill: 0 - Acetaminophen-Codeine 300-30 MG tablet; Take 1 tablet by mouth every 8 (eight) hours as needed for pain.  Dispense: 90 tablet; Refill: 0  2. Generalized OA - acetaminophen-codeine (TYLENOL #3) 300-30 MG tablet; Take 1-2 tablets by mouth every 6 (six) hours as needed for moderate pain.  Dispense: 90 tablet; Refill: 0 - Acetaminophen-Codeine 300-30 MG tablet; Take 1 tablet by mouth every 8 (eight) hours as needed for pain.  Dispense: 90 tablet; Refill: 0 - Acetaminophen-Codeine 300-30 MG tablet; Take 1 tablet by mouth every 8 (eight) hours as needed for pain.  Dispense: 90 tablet; Refill: 0  3. Plantar fasciitis - acetaminophen-codeine (TYLENOL #3) 300-30 MG tablet; Take 1-2 tablets by mouth every 6 (six) hours as needed for moderate pain.  Dispense: 90 tablet; Refill: 0 - Acetaminophen-Codeine  300-30 MG tablet; Take 1 tablet by mouth every 8 (eight) hours as needed for pain.  Dispense: 90 tablet; Refill: 0 - Acetaminophen-Codeine 300-30 MG tablet; Take 1 tablet by mouth every 8 (eight) hours as needed for pain.  Dispense: 90 tablet; Refill: 0  4. GAD (generalized anxiety disorder) - citalopram (CELEXA) 20 MG tablet; Take 1 tablet (20 mg total) by mouth daily.  Dispense: 90 tablet; Refill: 3    Current Outpatient Medications:  .  acetaminophen-codeine (TYLENOL #3) 300-30 MG tablet, Take 1-2 tablets by mouth every 6 (six) hours as needed for moderate pain., Disp: 90 tablet, Rfl: 0 .  Acetaminophen-Codeine 300-30 MG tablet, Take 1 tablet by mouth every 8 (eight) hours as needed for pain., Disp: 90 tablet, Rfl: 0 .  Acetaminophen-Codeine 300-30 MG tablet, Take 1 tablet by mouth every 8 (eight) hours as needed for pain., Disp: 90 tablet, Rfl: 0 .  citalopram (CELEXA) 20 MG tablet, Take 1 tablet (20 mg total) by mouth daily., Disp: 90 tablet, Rfl: 3 .  diclofenac (VOLTAREN) 75 MG EC tablet, Take 1 tablet (75 mg total) by mouth 2 (two) times daily., Disp: 180 tablet, Rfl: 3 .  esomeprazole (NEXIUM) 40 MG capsule, Take 1 capsule (40 mg total) by mouth daily at 12 noon., Disp: 90 capsule, Rfl: 3 .  hydrochlorothiazide (HYDRODIURIL) 25 MG tablet, Take 1 tablet (25 mg total) by mouth daily with breakfast., Disp: 90 tablet, Rfl: 3 .  ibuprofen (ADVIL,MOTRIN) 800 MG tablet, Take 1 tablet (800 mg total) by mouth 3 (three) times daily as needed for headache., Disp: 270 tablet, Rfl: 0 .  loratadine (CLARITIN) 10 MG tablet, TAKE 1 TABLET EVERY DAY FOR ALLERGIES, Disp: 30 tablet, Rfl: 10 .  metoprolol  succinate (TOPROL-XL) 25 MG 24 hr tablet, Take 0.5-1 tablets (12.5-25 mg total) by mouth daily., Disp: 90 tablet, Rfl: 3 .  potassium chloride SA (KLOR-CON M20) 20 MEQ tablet, TAKE 1 TABLET ONCE A DAY ORALLY 30 DAYS, Disp: 90 tablet, Rfl: 3 .  PROAIR HFA 108 (90 Base) MCG/ACT inhaler, INHALE 2 PUFFS AS  NEEDED EVERY 4 HOURS, Disp: 8.5 Inhaler, Rfl: 1 .  Vitamin D, Ergocalciferol, (DRISDOL) 50000 units CAPS capsule, TAKE 1 CAPSULE (50,000 UNITS TOTAL) BY MOUTH 2 (TWO) TIMES A WEEK. TAKES ON SUNDAY'S AND WEDNESDAY'S, Disp: 24 capsule, Rfl: 3 Continue all other maintenance medications as listed above.  Follow up plan: Return in about 3 months (around 08/30/2017) for recheck.  Educational handout given for [place] PA-C [facility] [address]  [place], [place] [address] [phone]   06/01/2017, 3:56 PM

## 2017-06-01 NOTE — Patient Instructions (Signed)
In a few days you may receive a survey in the mail or online from [employer] regarding your visit with us today. Please take a moment to fill this out. Your feedback is very important to our whole office. It can help us better understand your needs as well as improve your experience and satisfaction. Thank you for taking your time to complete it. We care about you.  Lorelei Heikkila, PA-C  

## 2017-07-02 DIAGNOSIS — F959 Tic disorder, unspecified: Secondary | ICD-10-CM | POA: Diagnosis not present

## 2017-07-11 ENCOUNTER — Encounter: Payer: Self-pay | Admitting: Physician Assistant

## 2017-07-20 ENCOUNTER — Other Ambulatory Visit: Payer: Self-pay | Admitting: *Deleted

## 2017-07-20 MED ORDER — VITAMIN D (ERGOCALCIFEROL) 1.25 MG (50000 UNIT) PO CAPS: 50000.0000 [IU] | ORAL_CAPSULE | ORAL | 3 refills | Status: DC

## 2017-08-29 ENCOUNTER — Ambulatory Visit: Payer: BLUE CROSS/BLUE SHIELD | Admitting: Physician Assistant

## 2017-09-06 DIAGNOSIS — F959 Tic disorder, unspecified: Secondary | ICD-10-CM | POA: Diagnosis not present

## 2017-09-07 ENCOUNTER — Ambulatory Visit: Payer: BLUE CROSS/BLUE SHIELD | Admitting: Physician Assistant

## 2017-09-07 DIAGNOSIS — M47816 Spondylosis without myelopathy or radiculopathy, lumbar region: Secondary | ICD-10-CM | POA: Diagnosis not present

## 2017-09-07 DIAGNOSIS — M159 Polyosteoarthritis, unspecified: Secondary | ICD-10-CM | POA: Diagnosis not present

## 2017-09-07 DIAGNOSIS — M722 Plantar fascial fibromatosis: Secondary | ICD-10-CM | POA: Diagnosis not present

## 2017-09-07 MED ORDER — ACETAMINOPHEN-CODEINE 300-30 MG PO TABS: 1.0000 | ORAL_TABLET | Freq: Three times a day (TID) | ORAL | 0 refills | Status: DC | PRN

## 2017-09-07 MED ORDER — ACETAMINOPHEN-CODEINE #3 300-30 MG PO TABS: 1.0000 | ORAL_TABLET | Freq: Four times a day (QID) | ORAL | 0 refills | Status: DC | PRN

## 2017-09-07 NOTE — Patient Instructions (Signed)

## 2017-09-09 ENCOUNTER — Encounter: Payer: Self-pay | Admitting: Physician Assistant

## 2017-09-09 NOTE — Progress Notes (Signed)
BP 117/73   Pulse 85   Temp 99 F (37.2 C) (Oral)   Ht _0  (1.575 m)   Wt 234 lb 6.4 oz (106.3 kg)   BMI 42.87 kg/m    Subjective:    Patient ID: Darlene [name], female    DOB: [birth year], 47 y.o.   MRN: [id number]  HPI: Darlene [name] is a 47 y.o. female presenting on 09/07/2017 for Follow-up (3 month )  This patient comes in for periodic recheck on medications and conditions including arthritis, plantar fasciitis, spondylosis. She is very stable with all her conditions. There are no new complaints  All medications are reviewed today. There are no reports of any problems with the medications. All of the medical conditions are reviewed and updated.  Lab work is reviewed and will be ordered as medically necessary. There are no new problems reported with today's visit.   Past Medical History:  Diagnosis Date  . GERD (gastroesophageal reflux disease)   . Hiatal hernia   . Hypertension   . Hypokalemia   . OA (osteoarthritis)   . Plantar fasciitis   . Vitamin D deficiency    Relevant past medical, surgical, family and social history reviewed and updated as indicated. Interim medical history since our last visit reviewed. Allergies and medications reviewed and updated. DATA REVIEWED: CHART IN EPIC  Family History reviewed for pertinent findings.  Review of Systems  Constitutional: Negative.   HENT: Negative.   Eyes: Negative.   Respiratory: Negative.   Gastrointestinal: Negative.   Genitourinary: Negative.     Allergies as of 09/07/2017   No Known Allergies     Medication List        Accurate as of 09/07/17 11:59 PM. Always use your most recent med list.          Acetaminophen-Codeine 300-30 MG tablet Take 1 tablet by mouth every 8 (eight) hours as needed for pain.   acetaminophen-codeine 300-30 MG tablet Commonly known as:  TYLENOL #3 Take 1-2 tablets by mouth every 6 (six) hours as needed for moderate pain.   Acetaminophen-Codeine 300-30 MG tablet Take 1  tablet by mouth every 8 (eight) hours as needed for pain.   citalopram 20 MG tablet Commonly known as:  CELEXA Take 1 tablet (20 mg total) by mouth daily.   diclofenac 75 MG EC tablet Commonly known as:  VOLTAREN Take 1 tablet (75 mg total) by mouth 2 (two) times daily.   esomeprazole 40 MG capsule Commonly known as:  NEXIUM Take 1 capsule (40 mg total) by mouth daily at 12 noon.   hydrochlorothiazide 25 MG tablet Commonly known as:  HYDRODIURIL Take 1 tablet (25 mg total) by mouth daily with breakfast.   ibuprofen 800 MG tablet Commonly known as:  ADVIL,MOTRIN Take 1 tablet (800 mg total) by mouth 3 (three) times daily as needed for headache.   loratadine 10 MG tablet Commonly known as:  CLARITIN TAKE 1 TABLET EVERY DAY FOR ALLERGIES   metoprolol succinate 25 MG 24 hr tablet Commonly known as:  TOPROL-XL Take 0.5-1 tablets (12.5-25 mg total) by mouth daily.   potassium chloride SA 20 MEQ tablet Commonly known as:  KLOR-CON M20 TAKE 1 TABLET ONCE A DAY ORALLY 30 DAYS   PROAIR HFA 108 (90 Base) MCG/ACT inhaler Generic drug:  albuterol INHALE 2 PUFFS AS NEEDED EVERY 4 HOURS   Vitamin D (Ergocalciferol) 50000 units Caps capsule Commonly known as:  DRISDOL Take 1 capsule (50,000 Units total) by mouth 2 (two)  times a week. Takes on Sunday's and Wednesday's          Objective:    BP 117/73   Pulse 85   Temp 99 F (37.2 C) (Oral)   Ht _0  (1.575 m)   Wt 234 lb 6.4 oz (106.3 kg)   BMI 42.87 kg/m   No Known Allergies  Wt Readings from Last 3 Encounters:  09/07/17 234 lb 6.4 oz (106.3 kg)  06/01/17 231 lb 6.4 oz (105 kg)  03/23/17 223 lb 9.6 oz (101.4 kg)    Physical Exam  Constitutional: She is oriented to person, place, and time. She appears well-developed and well-nourished.  HENT:  Head: Normocephalic and atraumatic.  Eyes: Pupils are equal, round, and reactive to light. Conjunctivae and EOM are normal.  Cardiovascular: Normal rate, regular rhythm,  normal heart sounds and intact distal pulses.  Pulmonary/Chest: Effort normal and breath sounds normal.  Abdominal: Soft. Bowel sounds are normal.  Neurological: She is alert and oriented to person, place, and time. She has normal reflexes.  Skin: Skin is warm and dry. No rash noted.  Psychiatric: She has a normal mood and affect. Her behavior is normal. Judgment and thought content normal.    Results for orders placed or performed in visit on 11/28/16  CMP14+EGFR  Result Value Ref Range   Glucose 106 (H) 65 - 99 mg/dL   BUN 19 6 - 24 mg/dL   Creatinine, Ser 0.71 0.57 - 1.00 mg/dL   GFR calc non Af Amer 104 >59 mL/min/1.73   GFR calc Af Amer 120 >59 mL/min/1.73   BUN/Creatinine Ratio 27 (H) 9 - 23   Sodium 142 134 - 144 mmol/L   Potassium 3.5 3.5 - 5.2 mmol/L   Chloride 101 96 - 106 mmol/L   CO2 20 20 - 29 mmol/L   Calcium 9.3 8.7 - 10.2 mg/dL   Total Protein 7.1 6.0 - 8.5 g/dL   Albumin 4.2 3.5 - 5.5 g/dL   Globulin, Total 2.9 1.5 - 4.5 g/dL   Albumin/Globulin Ratio 1.4 1.2 - 2.2   Bilirubin Total 0.2 0.0 - 1.2 mg/dL   Alkaline Phosphatase 95 39 - 117 IU/L   AST 15 0 - 40 IU/L   ALT 13 0 - 32 IU/L  CBC with Differential/Platelet  Result Value Ref Range   WBC 8.5 3.4 - 10.8 x10E3/uL   RBC 4.37 3.77 - 5.28 x10E6/uL   Hemoglobin 13.0 11.1 - 15.9 g/dL   Hematocrit 40.0 34.0 - 46.6 %   MCV 92 79 - 97 fL   MCH 29.7 26.6 - 33.0 pg   MCHC 32.5 31.5 - 35.7 g/dL   RDW 12.5 12.3 - 15.4 %   Platelets 295 150 - 379 x10E3/uL   Neutrophils 63 Not Estab. %   Lymphs 30 Not Estab. %   Monocytes 5 Not Estab. %   Eos 2 Not Estab. %   Basos 0 Not Estab. %   Neutrophils Absolute 5.3 1.4 - 7.0 x10E3/uL   Lymphocytes Absolute 2.5 0.7 - 3.1 x10E3/uL   Monocytes Absolute 0.4 0.1 - 0.9 x10E3/uL   EOS (ABSOLUTE) 0.2 0.0 - 0.4 x10E3/uL   Basophils Absolute 0.0 0.0 - 0.2 x10E3/uL   Immature Granulocytes 0 Not Estab. %   Immature Grans (Abs) 0.0 0.0 - 0.1 x10E3/uL  TSH  Result Value Ref Range    TSH 0.863 0.450 - 4.500 uIU/mL      Assessment & Plan:   1. Generalized OA - Acetaminophen-Codeine 300-30  MG tablet; Take 1 tablet by mouth every 8 (eight) hours as needed for pain.  Dispense: 90 tablet; Refill: 0 - acetaminophen-codeine (TYLENOL #3) 300-30 MG tablet; Take 1-2 tablets by mouth every 6 (six) hours as needed for moderate pain.  Dispense: 90 tablet; Refill: 0 - Acetaminophen-Codeine 300-30 MG tablet; Take 1 tablet by mouth every 8 (eight) hours as needed for pain.  Dispense: 90 tablet; Refill: 0  2. Plantar fasciitis - Acetaminophen-Codeine 300-30 MG tablet; Take 1 tablet by mouth every 8 (eight) hours as needed for pain.  Dispense: 90 tablet; Refill: 0 - acetaminophen-codeine (TYLENOL #3) 300-30 MG tablet; Take 1-2 tablets by mouth every 6 (six) hours as needed for moderate pain.  Dispense: 90 tablet; Refill: 0 - Acetaminophen-Codeine 300-30 MG tablet; Take 1 tablet by mouth every 8 (eight) hours as needed for pain.  Dispense: 90 tablet; Refill: 0  3. Spondylosis of lumbar region without myelopathy or radiculopathy - Acetaminophen-Codeine 300-30 MG tablet; Take 1 tablet by mouth every 8 (eight) hours as needed for pain.  Dispense: 90 tablet; Refill: 0 - acetaminophen-codeine (TYLENOL #3) 300-30 MG tablet; Take 1-2 tablets by mouth every 6 (six) hours as needed for moderate pain.  Dispense: 90 tablet; Refill: 0 - Acetaminophen-Codeine 300-30 MG tablet; Take 1 tablet by mouth every 8 (eight) hours as needed for pain.  Dispense: 90 tablet; Refill: 0   Continue all other maintenance medications as listed above.  Follow up plan: Return in about 3 months (around 12/07/2017).  Educational handout given for [place] PA-C [facility] [address]  [place], [place] [address] [phone]   09/09/2017, 8:56 PM

## 2017-10-02 ENCOUNTER — Encounter: Payer: Self-pay | Admitting: Physician Assistant

## 2017-10-03 ENCOUNTER — Other Ambulatory Visit: Payer: Self-pay | Admitting: Physician Assistant

## 2017-10-28 ENCOUNTER — Other Ambulatory Visit: Payer: Self-pay | Admitting: Physician Assistant

## 2017-10-28 DIAGNOSIS — I471 Supraventricular tachycardia: Secondary | ICD-10-CM

## 2017-10-29 ENCOUNTER — Other Ambulatory Visit: Payer: Self-pay | Admitting: Physician Assistant

## 2017-10-30 ENCOUNTER — Other Ambulatory Visit: Payer: Self-pay | Admitting: Physician Assistant

## 2017-10-30 DIAGNOSIS — M159 Polyosteoarthritis, unspecified: Secondary | ICD-10-CM

## 2017-10-30 DIAGNOSIS — M722 Plantar fascial fibromatosis: Secondary | ICD-10-CM

## 2017-10-30 DIAGNOSIS — M47816 Spondylosis without myelopathy or radiculopathy, lumbar region: Secondary | ICD-10-CM

## 2017-10-31 ENCOUNTER — Other Ambulatory Visit: Payer: Self-pay | Admitting: Physician Assistant

## 2017-10-31 MED ORDER — LINACLOTIDE 145 MCG PO CAPS: 145.0000 ug | ORAL_CAPSULE | Freq: Every day | ORAL | 5 refills | Status: DC

## 2017-11-12 ENCOUNTER — Encounter: Payer: Self-pay | Admitting: Physician Assistant

## 2017-11-12 ENCOUNTER — Other Ambulatory Visit: Payer: Self-pay | Admitting: Physician Assistant

## 2017-11-12 MED ORDER — PENICILLIN V POTASSIUM 500 MG PO TABS: 500.0000 mg | ORAL_TABLET | Freq: Four times a day (QID) | ORAL | 1 refills | Status: DC

## 2017-12-06 ENCOUNTER — Other Ambulatory Visit: Payer: Self-pay | Admitting: Physician Assistant

## 2017-12-10 ENCOUNTER — Ambulatory Visit: Payer: BLUE CROSS/BLUE SHIELD | Admitting: Physician Assistant

## 2017-12-10 ENCOUNTER — Encounter: Payer: Self-pay | Admitting: Physician Assistant

## 2017-12-10 DIAGNOSIS — M722 Plantar fascial fibromatosis: Secondary | ICD-10-CM | POA: Diagnosis not present

## 2017-12-10 DIAGNOSIS — M47816 Spondylosis without myelopathy or radiculopathy, lumbar region: Secondary | ICD-10-CM

## 2017-12-10 DIAGNOSIS — M159 Polyosteoarthritis, unspecified: Secondary | ICD-10-CM

## 2017-12-10 MED ORDER — ACETAMINOPHEN-CODEINE 300-30 MG PO TABS: 1.0000 | ORAL_TABLET | Freq: Three times a day (TID) | ORAL | 0 refills | Status: DC | PRN

## 2017-12-10 MED ORDER — ACETAMINOPHEN-CODEINE #3 300-30 MG PO TABS: 1.0000 | ORAL_TABLET | Freq: Four times a day (QID) | ORAL | 0 refills | Status: DC | PRN

## 2017-12-10 NOTE — Progress Notes (Addendum)
.   BP 116/80   Pulse 89   Ht _0  (1.575 m)   Wt 218 lb 12.8 oz (99.2 kg)   BMI 40.02 kg/m    Subjective:    Patient ID: Darlene [name], female    DOB: 06-18-1970, 47 y.o.   MRN: [id number]  HPI: Darlene [name] is a 47 y.o. female presenting on 12/10/2017 for Anxiety (3 month follow up ) and Pain  This patient comes in for 67-monthfollow-up on her generalized anxiety and chronic pain.  She is doing well with her medications.  She is not able to take NSAIDs due to GERD and a repaired hiatal hernia.  She is using her Tylenol 3 on a very limited basis as much as possible.  She reports that she is doing overall good with all of her other medications she is stable with everything she will be going back to see her neurologist for the tic.  She does not feel the the amantadine is helping anymore.  She has also eliminated everything to drink except water.  She has limited some of her eating and is trying to exercise 2 or 3 days/week.  She is down about 16 pounds since her last visit.  Past Medical History:  Diagnosis Date  . GERD (gastroesophageal reflux disease)   . Hiatal hernia   . Hypertension   . Hypokalemia   . OA (osteoarthritis)   . Plantar fasciitis   . Vitamin D deficiency    Relevant past medical, surgical, family and social history reviewed and updated as indicated. Interim medical history since our last visit reviewed. Allergies and medications reviewed and updated. DATA REVIEWED: CHART IN EPIC  Family History reviewed for pertinent findings.  Review of Systems  Constitutional: Negative.  Negative for activity change, fatigue and fever.  HENT: Negative.   Eyes: Negative.   Respiratory: Negative.  Negative for cough.   Cardiovascular: Negative.  Negative for chest pain.  Gastrointestinal: Negative.  Negative for abdominal pain.  Endocrine: Negative.   Genitourinary: Negative.  Negative for dysuria.  Musculoskeletal: Negative.   Skin: Negative.   Neurological: Negative.       Allergies as of 12/10/2017   No Known Allergies     Medication List        Accurate as of 12/10/17  4:02 PM. Always use your most recent med list.          acetaminophen-codeine 300-30 MG tablet Commonly known as:  TYLENOL #3 Take 1-2 tablets by mouth every 6 (six) hours as needed for moderate pain.   Acetaminophen-Codeine 300-30 MG tablet Take 1 tablet by mouth every 8 (eight) hours as needed for pain.   Acetaminophen-Codeine 300-30 MG tablet Take 1 tablet by mouth every 8 (eight) hours as needed for pain.   amantadine 100 MG capsule Commonly known as:  SYMMETREL TAKE 3 CAPSULES (300 MG TOTAL) BY MOUTH DAILY.   citalopram 20 MG tablet Commonly known as:  CELEXA Take 1 tablet (20 mg total) by mouth daily.   diclofenac 75 MG EC tablet Commonly known as:  VOLTAREN Take 1 tablet (75 mg total) by mouth 2 (two) times daily.   esomeprazole 40 MG capsule Commonly known as:  NEXIUM TAKE 1 CAPSULE (40 MG TOTAL) BY MOUTH DAILY AT 12 NOON.   hydrochlorothiazide 25 MG tablet Commonly known as:  HYDRODIURIL Take 1 tablet (25 mg total) by mouth daily with breakfast.   ibuprofen 800 MG tablet Commonly known as:  ADVIL,MOTRIN TAKE 1 TABLET (800  MG TOTAL) BY MOUTH 3 (THREE) TIMES DAILY AS NEEDED FOR HEADACHE.   linaclotide 145 MCG Caps capsule Commonly known as:  LINZESS Take 1 capsule (145 mcg total) by mouth daily before breakfast.   loratadine 10 MG tablet Commonly known as:  CLARITIN TAKE 1 TABLET BY MOUTH EVERY DAY FOR ALLERGIES   metoprolol succinate 25 MG 24 hr tablet Commonly known as:  TOPROL-XL TAKE 1/2 TO 1 TABLET DAILY   penicillin v potassium 500 MG tablet Commonly known as:  VEETID Take 1 tablet (500 mg total) by mouth 4 (four) times daily.   potassium chloride SA 20 MEQ tablet Commonly known as:  KLOR-CON M20 TAKE 1 TABLET ONCE A DAY ORALLY 30 DAYS   PROAIR HFA 108 (90 Base) MCG/ACT inhaler Generic drug:  albuterol INHALE 2 PUFFS AS NEEDED EVERY 4  HOURS   Vitamin D (Ergocalciferol) 50000 units Caps capsule Commonly known as:  DRISDOL Take 1 capsule (50,000 Units total) by mouth 2 (two) times a week. Takes on Sunday's and Wednesday's          Objective:    BP 116/80   Pulse 89   Ht _0  (1.575 m)   Wt 218 lb 12.8 oz (99.2 kg)   BMI 40.02 kg/m   No Known Allergies  Wt Readings from Last 3 Encounters:  12/10/17 218 lb 12.8 oz (99.2 kg)  09/07/17 234 lb 6.4 oz (106.3 kg)  06/01/17 231 lb 6.4 oz (105 kg)    Physical Exam  Constitutional: She is oriented to person, place, and time. She appears well-developed and well-nourished.  HENT:  Head: Normocephalic and atraumatic.  Eyes: Pupils are equal, round, and reactive to light. Conjunctivae and EOM are normal.  Cardiovascular: Normal rate, regular rhythm, normal heart sounds and intact distal pulses.  Pulmonary/Chest: Effort normal and breath sounds normal.  Abdominal: Soft. Bowel sounds are normal.  Neurological: She is alert and oriented to person, place, and time. She has normal reflexes.  Skin: Skin is warm and dry. No rash noted.  Psychiatric: She has a normal mood and affect. Her behavior is normal. Judgment and thought content normal.    Results for orders placed or performed in visit on 11/28/16  CMP14+EGFR  Result Value Ref Range   Glucose 106 (H) 65 - 99 mg/dL   BUN 19 6 - 24 mg/dL   Creatinine, Ser 0.71 0.57 - 1.00 mg/dL   GFR calc non Af Amer 104 >59 mL/min/1.73   GFR calc Af Amer 120 >59 mL/min/1.73   BUN/Creatinine Ratio 27 (H) 9 - 23   Sodium 142 134 - 144 mmol/L   Potassium 3.5 3.5 - 5.2 mmol/L   Chloride 101 96 - 106 mmol/L   CO2 20 20 - 29 mmol/L   Calcium 9.3 8.7 - 10.2 mg/dL   Total Protein 7.1 6.0 - 8.5 g/dL   Albumin 4.2 3.5 - 5.5 g/dL   Globulin, Total 2.9 1.5 - 4.5 g/dL   Albumin/Globulin Ratio 1.4 1.2 - 2.2   Bilirubin Total 0.2 0.0 - 1.2 mg/dL   Alkaline Phosphatase 95 39 - 117 IU/L   AST 15 0 - 40 IU/L   ALT 13 0 - 32 IU/L  CBC  with Differential/Platelet  Result Value Ref Range   WBC 8.5 3.4 - 10.8 x10E3/uL   RBC 4.37 3.77 - 5.28 x10E6/uL   Hemoglobin 13.0 11.1 - 15.9 g/dL   Hematocrit 40.0 34.0 - 46.6 %   MCV 92 79 - 97 fL  MCH 29.7 26.6 - 33.0 pg   MCHC 32.5 31.5 - 35.7 g/dL   RDW 12.5 12.3 - 15.4 %   Platelets 295 150 - 379 x10E3/uL   Neutrophils 63 Not Estab. %   Lymphs 30 Not Estab. %   Monocytes 5 Not Estab. %   Eos 2 Not Estab. %   Basos 0 Not Estab. %   Neutrophils Absolute 5.3 1.4 - 7.0 x10E3/uL   Lymphocytes Absolute 2.5 0.7 - 3.1 x10E3/uL   Monocytes Absolute 0.4 0.1 - 0.9 x10E3/uL   EOS (ABSOLUTE) 0.2 0.0 - 0.4 x10E3/uL   Basophils Absolute 0.0 0.0 - 0.2 x10E3/uL   Immature Granulocytes 0 Not Estab. 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  Dispense: 90 tablet; Refill: 0 - Acetaminophen-Codeine 300-30 MG tablet; Take 1 tablet by mouth every 8 (eight) hours as needed for pain.  Dispense: 90 tablet; Refill: 0 - Acetaminophen-Codeine 300-30 MG tablet; Take 1 tablet by mouth every 8 (eight) hours as needed for pain.  Dispense: 90 tablet; Refill: 0  2. Generalized OA - acetaminophen-codeine (TYLENOL #3) 300-30 MG tablet; Take 1-2 tablets by mouth every 6 (six) hours as needed for moderate pain.  Dispense: 90 tablet; Refill: 0 - Acetaminophen-Codeine 300-30 MG tablet; Take 1 tablet by mouth every 8 (eight) hours as needed for pain.  Dispense: 90 tablet; Refill: 0 - Acetaminophen-Codeine 300-30 MG tablet; Take 1 tablet by mouth every 8 (eight) hours as needed for pain.  Dispense: 90 tablet; Refill: 0  3. Plantar fasciitis - acetaminophen-codeine (TYLENOL #3) 300-30 MG tablet; Take 1-2 tablets by mouth  every 6 (six) hours as needed for moderate pain.  Dispense: 90 tablet; Refill: 0 - Acetaminophen-Codeine 300-30 MG tablet; Take 1 tablet by mouth every 8 (eight) hours as needed for pain.  Dispense: 90 tablet; Refill: 0 - Acetaminophen-Codeine 300-30 MG tablet; Take 1 tablet by mouth every 8 (eight) hours as needed for pain.  Dispense: 90 tablet; Refill: 0   Continue all other maintenance medications as listed above.  Follow up plan: Return in about 3 months (around 03/12/2018) for recheck.  Educational handout given for [place] PA-C [facility] [address]  [place], [place] [address] [phone]   12/10/2017, 4:02 PM

## 2017-12-12 DIAGNOSIS — G4761 Periodic limb movement disorder: Secondary | ICD-10-CM | POA: Diagnosis not present

## 2018-01-31 ENCOUNTER — Encounter: Payer: Self-pay | Admitting: Family Medicine

## 2018-01-31 ENCOUNTER — Other Ambulatory Visit: Payer: Self-pay | Admitting: Family Medicine

## 2018-01-31 ENCOUNTER — Other Ambulatory Visit: Payer: Self-pay | Admitting: Physician Assistant

## 2018-01-31 ENCOUNTER — Encounter: Payer: Self-pay | Admitting: Physician Assistant

## 2018-01-31 MED ORDER — ESOMEPRAZOLE MAGNESIUM 40 MG PO CPDR: 40.0000 mg | DELAYED_RELEASE_CAPSULE | Freq: Two times a day (BID) | ORAL | 1 refills | Status: DC

## 2018-02-20 ENCOUNTER — Other Ambulatory Visit: Payer: Self-pay | Admitting: Physician Assistant

## 2018-02-20 ENCOUNTER — Encounter: Payer: Self-pay | Admitting: Physician Assistant

## 2018-02-20 MED ORDER — CYCLOBENZAPRINE HCL 10 MG PO TABS: 10.0000 mg | ORAL_TABLET | Freq: Three times a day (TID) | ORAL | 0 refills | Status: DC | PRN

## 2018-02-20 NOTE — Progress Notes (Signed)
flex 

## 2018-02-24 ENCOUNTER — Other Ambulatory Visit: Payer: Self-pay | Admitting: Physician Assistant

## 2018-03-03 ENCOUNTER — Other Ambulatory Visit: Payer: Self-pay | Admitting: Physician Assistant

## 2018-03-05 ENCOUNTER — Other Ambulatory Visit: Payer: Self-pay | Admitting: Physician Assistant

## 2018-03-06 DIAGNOSIS — G253 Myoclonus: Secondary | ICD-10-CM | POA: Diagnosis not present

## 2018-03-06 DIAGNOSIS — G4761 Periodic limb movement disorder: Secondary | ICD-10-CM | POA: Diagnosis not present

## 2018-03-13 ENCOUNTER — Ambulatory Visit: Payer: BLUE CROSS/BLUE SHIELD | Admitting: Physician Assistant

## 2018-03-13 ENCOUNTER — Encounter: Payer: Self-pay | Admitting: Physician Assistant

## 2018-03-13 DIAGNOSIS — M722 Plantar fascial fibromatosis: Secondary | ICD-10-CM

## 2018-03-13 DIAGNOSIS — F411 Generalized anxiety disorder: Secondary | ICD-10-CM | POA: Diagnosis not present

## 2018-03-13 DIAGNOSIS — M47816 Spondylosis without myelopathy or radiculopathy, lumbar region: Secondary | ICD-10-CM | POA: Diagnosis not present

## 2018-03-13 DIAGNOSIS — M159 Polyosteoarthritis, unspecified: Secondary | ICD-10-CM

## 2018-03-13 DIAGNOSIS — Z23 Encounter for immunization: Secondary | ICD-10-CM

## 2018-03-13 MED ORDER — CITALOPRAM HYDROBROMIDE 20 MG PO TABS: 20.0000 mg | ORAL_TABLET | Freq: Every day | ORAL | 3 refills | Status: DC

## 2018-03-13 MED ORDER — IBUPROFEN 800 MG PO TABS: 800.0000 mg | ORAL_TABLET | Freq: Every day | ORAL | 0 refills | Status: DC

## 2018-03-13 MED ORDER — ACETAMINOPHEN-CODEINE #3 300-30 MG PO TABS: 1.0000 | ORAL_TABLET | Freq: Four times a day (QID) | ORAL | 5 refills | Status: DC | PRN

## 2018-03-13 MED ORDER — DICLOFENAC SODIUM 75 MG PO TBEC: DELAYED_RELEASE_TABLET | ORAL | 1 refills | Status: DC

## 2018-03-13 MED ORDER — INSULIN PEN NEEDLE 31G X 5 MM MISC: 1.0000 [IU] | Freq: Every day | 2 refills | Status: DC

## 2018-03-13 MED ORDER — LIRAGLUTIDE -WEIGHT MANAGEMENT 18 MG/3ML ~~LOC~~ SOPN: 0.6000 mg | PEN_INJECTOR | Freq: Every day | SUBCUTANEOUS | 2 refills | Status: DC

## 2018-03-13 MED ORDER — HYDROCHLOROTHIAZIDE 25 MG PO TABS: 25.0000 mg | ORAL_TABLET | Freq: Every day | ORAL | 3 refills | Status: DC

## 2018-03-13 MED ORDER — ESOMEPRAZOLE MAGNESIUM 40 MG PO CPDR: 40.0000 mg | DELAYED_RELEASE_CAPSULE | Freq: Two times a day (BID) | ORAL | 1 refills | Status: DC

## 2018-03-13 NOTE — Progress Notes (Signed)
BP 107/68   Pulse 77   Temp (!) 97.5 F (36.4 C) (Oral)   Ht 5\' 2"  (1.575 m)   Wt 235 lb 6.4 oz (106.8 kg)   BMI 43.06 kg/m    Subjective:    Patient ID: Darlene [name], female    DOB: Dec 25, 1970, 47 y.o.   MRN: [id number]  HPI: Darlene [name] is a 47 y.o. female presenting on 03/13/2018 for Medical Management of Chronic Issues (three month recheck) This patient comes in for periodic recheck on medications and conditions including spondylosis, OA, plantar fasciitis, GAD.  She has also had a lot of weight gain..   All medications are reviewed today. There are no reports of any problems with the medications. All of the medical conditions are reviewed and updated.  Lab work is reviewed and will be ordered as medically necessary. There are no new problems reported with today's visit. DIET measures:  In the past diet changes included lots of water and vegetables. She was on a low carb diet and gluten free and lost a lot of weight.                          Denies Darlene [name] or Slim fast. Has taken phentermine but was cardiovascularly sensitive to it.  Past Medical History:  Diagnosis Date  . GERD (gastroesophageal reflux disease)   . Hiatal hernia   . Hypertension   . Hypokalemia   . OA (osteoarthritis)   . Plantar fasciitis   . Vitamin D deficiency    Relevant past medical, surgical, family and social history reviewed and updated as indicated. Interim medical history since our last visit reviewed. Allergies and medications reviewed and updated. DATA REVIEWED: CHART IN EPIC  Family History reviewed for pertinent findings.  Review of Systems  Constitutional: Positive for unexpected weight change.  HENT: Negative.   Eyes: Negative.   Respiratory: Negative.   Gastrointestinal: Negative.   Genitourinary: Negative.   Musculoskeletal: Positive for joint swelling and myalgias.    Allergies as of 03/13/2018   No Known Allergies     Medication List        Accurate as of  03/13/18 11:59 PM. Always use your most recent med list.          acetaminophen-codeine 300-30 MG tablet Commonly known as:  TYLENOL #3 Take 1-2 tablets by mouth every 6 (six) hours as needed for moderate pain.   citalopram 20 MG tablet Commonly known as:  CELEXA Take 1 tablet (20 mg total) by mouth daily.   clonazePAM 0.25 MG disintegrating tablet Commonly known as:  KLONOPIN TAKE 1 TABLET (0.25 MG TOTAL) BY MOUTH NIGHTLY. AS DIRECTED FOR JERKING   cyclobenzaprine 10 MG tablet Commonly known as:  FLEXERIL Take 1 tablet (10 mg total) by mouth 3 (three) times daily as needed for muscle spasms.   diclofenac 75 MG EC tablet Commonly known as:  VOLTAREN Take 1-2 tablets daily   esomeprazole 40 MG capsule Commonly known as:  NEXIUM Take 1 capsule (40 mg total) by mouth 2 (two) times daily before a meal.   hydrochlorothiazide 25 MG tablet Commonly known as:  HYDRODIURIL Take 1 tablet (25 mg total) by mouth daily with breakfast.   ibuprofen 800 MG tablet Commonly known as:  ADVIL,MOTRIN Take 1 tablet (800 mg total) by mouth at bedtime.   Insulin Pen Needle 31G X 5 MM Misc 1 Units by Does not apply route daily.  linaclotide 145 MCG Caps capsule Commonly known as:  LINZESS Take 1 capsule (145 mcg total) by mouth daily before breakfast.   Liraglutide -Weight Management 18 MG/3ML Sopn Inject 0.6-3 mg into the skin daily.   loratadine 10 MG tablet Commonly known as:  CLARITIN TAKE 1 TABLET BY MOUTH EVERY DAY FOR ALLERGIES   metoprolol succinate 25 MG 24 hr tablet Commonly known as:  TOPROL-XL TAKE 1/2 TO 1 TABLET DAILY   potassium chloride SA 20 MEQ tablet Commonly known as:  K-DUR,KLOR-CON TAKE 1 TABLET ONCE A DAY ORALLY 30 DAYS   pramipexole 0.25 MG tablet Commonly known as:  MIRAPEX Take by mouth.   PROAIR HFA 108 (90 Base) MCG/ACT inhaler Generic drug:  albuterol INHALE 2 PUFFS AS NEEDED EVERY 4 HOURS   Vitamin D (Ergocalciferol) 50000 units Caps  capsule Commonly known as:  DRISDOL Take 1 capsule (50,000 Units total) by mouth 2 (two) times a week. Takes on Sunday's and Wednesday's          Objective:    BP 107/68   Pulse 77   Temp (!) 97.5 F (36.4 C) (Oral)   Ht 5\' 2"  (1.575 m)   Wt 235 lb 6.4 oz (106.8 kg)   BMI 43.06 kg/m   No Known Allergies  Wt Readings from Last 3 Encounters:  03/13/18 235 lb 6.4 oz (106.8 kg)  12/10/17 218 lb 12.8 oz (99.2 kg)  09/07/17 234 lb 6.4 oz (106.3 kg)    Physical Exam  Constitutional: She is oriented to person, place, and time. She appears well-developed and well-nourished.  HENT:  Head: Normocephalic and atraumatic.  Eyes: Pupils are equal, round, and reactive to light. Conjunctivae and EOM are normal.  Cardiovascular: Normal rate, regular rhythm, normal heart sounds and intact distal pulses.  Pulmonary/Chest: Effort normal and breath sounds normal.  Abdominal: Soft. Bowel sounds are normal.  Neurological: She is alert and oriented to person, place, and time. She has normal reflexes.  Skin: Skin is warm and dry. No rash noted.  Psychiatric: She has a normal mood and affect. Her behavior is normal. Judgment and thought content normal.        Assessment & Plan:   1. Spondylosis of lumbar region without myelopathy or radiculopathy - acetaminophen-codeine (TYLENOL #3) 300-30 MG tablet; Take 1-2 tablets by mouth every 6 (six) hours as needed for moderate pain.  Dispense: 90 tablet; Refill: 5  2. Generalized OA - acetaminophen-codeine (TYLENOL #3) 300-30 MG tablet; Take 1-2 tablets by mouth every 6 (six) hours as needed for moderate pain.  Dispense: 90 tablet; Refill: 5  3. Plantar fasciitis - acetaminophen-codeine (TYLENOL #3) 300-30 MG tablet; Take 1-2 tablets by mouth every 6 (six) hours as needed for moderate pain.  Dispense: 90 tablet; Refill: 5 - diclofenac (VOLTAREN) 75 MG EC tablet; Take 1-2 tablets daily  Dispense: 180 tablet; Refill: 1  4. GAD (generalized anxiety  disorder) - citalopram (CELEXA) 20 MG tablet; Take 1 tablet (20 mg total) by mouth daily.  Dispense: 90 tablet; Refill: 3   Continue all other maintenance medications as listed above.  Follow up plan: Return in about 2 months (around 05/13/2018).  Educational handout given for survey  Remus Loffler PA-C [facility] [address]  [place], [place] [address] [phone]   03/17/2018, 10:22 PM

## 2018-03-15 ENCOUNTER — Telehealth: Payer: Self-pay

## 2018-03-15 NOTE — Telephone Encounter (Signed)
INsurance denied prior auth for Saxenda  Needs to have tried both Belviq and Qsymia

## 2018-03-17 ENCOUNTER — Other Ambulatory Visit: Payer: Self-pay | Admitting: Physician Assistant

## 2018-03-18 ENCOUNTER — Encounter: Payer: Self-pay | Admitting: Physician Assistant

## 2018-03-18 ENCOUNTER — Other Ambulatory Visit: Payer: Self-pay | Admitting: Physician Assistant

## 2018-03-18 MED ORDER — TOPIRAMATE 50 MG PO TABS: 50.0000 mg | ORAL_TABLET | Freq: Every day | ORAL | 1 refills | Status: DC

## 2018-03-18 NOTE — Telephone Encounter (Signed)
Pharmacy comment:  Alternative Requested:PRIOR AUTH DENIED BY PT'S INSURANCE. PLEASE SEND ALTERNATIVE MEDICATION  Per Debbie:patient needs to have tried both Belviq or Qsymia  Please advise

## 2018-03-18 NOTE — Progress Notes (Signed)
topamax

## 2018-03-19 ENCOUNTER — Encounter: Payer: Self-pay | Admitting: Gastroenterology

## 2018-03-19 ENCOUNTER — Ambulatory Visit (AMBULATORY_SURGERY_CENTER): Payer: BLUE CROSS/BLUE SHIELD | Admitting: Gastroenterology

## 2018-03-19 ENCOUNTER — Ambulatory Visit: Payer: BLUE CROSS/BLUE SHIELD | Admitting: Gastroenterology

## 2018-03-19 VITALS — BP 106/75 | HR 79 | Temp 98.9°F | Resp 21 | Ht 62.0 in | Wt 235.0 lb

## 2018-03-19 VITALS — BP 124/90 | HR 88 | Ht 62.0 in | Wt 235.0 lb

## 2018-03-19 DIAGNOSIS — Z9889 Other specified postprocedural states: Secondary | ICD-10-CM

## 2018-03-19 DIAGNOSIS — K3189 Other diseases of stomach and duodenum: Secondary | ICD-10-CM | POA: Diagnosis not present

## 2018-03-19 DIAGNOSIS — R1310 Dysphagia, unspecified: Secondary | ICD-10-CM

## 2018-03-19 DIAGNOSIS — R1013 Epigastric pain: Secondary | ICD-10-CM

## 2018-03-19 DIAGNOSIS — G8929 Other chronic pain: Secondary | ICD-10-CM | POA: Diagnosis not present

## 2018-03-19 DIAGNOSIS — Z8719 Personal history of other diseases of the digestive system: Secondary | ICD-10-CM

## 2018-03-19 DIAGNOSIS — K319 Disease of stomach and duodenum, unspecified: Secondary | ICD-10-CM | POA: Diagnosis not present

## 2018-03-19 MED ORDER — SODIUM CHLORIDE 0.9 % IV SOLN: 500.0000 mL | Freq: Once | INTRAVENOUS | Status: DC

## 2018-03-19 NOTE — Op Note (Addendum)
[facility] Patient Name: Darlene [name] Procedure Date: 03/19/2018 3:26 PM MRN: [id number] Endoscopist: Viviann Spare P. Adela Lank , MD Age: 47 Referring MD:  Date of Birth: [birth year] Gender: Female Account #: [id number] Procedure:                Upper GI endoscopy Indications:              Epigastric abdominal pain, Dysphagia Medicines:                Monitored Anesthesia Care Procedure:                Pre-Anesthesia Assessment:                           - Prior to the procedure, a History and Physical                            was performed, and patient medications and                            allergies were reviewed. The patient's tolerance of                            previous anesthesia was also reviewed. The risks                            and benefits of the procedure and the sedation                            options and risks were discussed with the patient.                            All questions were answered, and informed consent                            was obtained. Prior Anticoagulants: The patient has                            taken no previous anticoagulant or antiplatelet                            agents. ASA Grade Assessment: II - A patient with                            mild systemic disease. After reviewing the risks                            and benefits, the patient was deemed in                            satisfactory condition to undergo the procedure.                           After obtaining informed consent, the endoscope was  passed under direct vision. Throughout the                            procedure, the patient's blood pressure, pulse, and                            oxygen saturations were monitored continuously. The                            Endoscope was introduced through the mouth, and                            advanced to the second part of duodenum. The upper                            GI endoscopy  was accomplished without difficulty.                            The patient tolerated the procedure well. Scope In: Scope Out: Findings:                 The Z-line was regular.                           The exam of the esophagus was otherwise normal. No                            stenosis or stricture noted.                           Evidence of a Nissen fundoplication was found in                            the cardia. The wrap appeared intact.                           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                           The exam of the stomach was otherwise normal.                           Biopsies were taken with a cold forceps in the                            gastric body, at the incisura and in the gastric                            antrum for Helicobacter pylori testing.                           The duodenal bulb and second portion  of the                            duodenum were normal. Complications:            No immediate complications. Estimated blood loss:                            Minimal. Estimated Blood Loss:     Estimated blood loss was minimal. Impression:               - Z-line regular.                           - A Nissen fundoplication was found. The wrap                            appears intact.                           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                           - Biopsies were taken with a cold forceps for                            Helicobacter pylori testing. Recommendation:           - Patient has a contact number available for                            emergencies. The signs and symptoms of potential                            delayed complications were discussed with the                             patient. Return to normal activities tomorrow.                            Written discharge instructions were provided to the                            patient.                           - Resume previous diet.                           - Continue  present medications.                           - Await pathology results with further                            recommendations Viviann Spare P. Quandarius Nill, MD 03/19/2018 3:46:15 PM This report has been signed electronically.

## 2018-03-19 NOTE — Progress Notes (Signed)
Report given to PACU, vss 

## 2018-03-19 NOTE — Patient Instructions (Signed)
If you are age 45 or older, your body mass index should be between 23-30. Your Body mass index is 42.98 kg/m. If this is out of the aforementioned range listed, please consider follow up with your Primary Care Provider.  If you are age 69 or younger, your body mass index should be between 19-25. Your Body mass index is 42.98 kg/m. If this is out of the aformentioned range listed, please consider follow up with your Primary Care Provider.   You have been scheduled for an endoscopy. Please follow written instructions given to you at your visit today. If you use inhalers (even only as needed), please bring them with you on the day of your procedure. Your physician has requested that you go to www.startemmi.com and enter the access code given to you at your visit today. This web site gives a general overview about your procedure. However, you should still follow specific instructions given to you by our office regarding your preparation for the procedure.  It was a pleasure to see you today!  Dr. Adela Lank

## 2018-03-19 NOTE — Progress Notes (Signed)
HPI :  47 year old female here for follow-up visit. She previously had seen me for abdominal pain, EGD 10/20/2015 - paraesophageal hernia, otherwise normal, no evidence of H pylori. CT scan confirmed large hernia, otherwise no acute process noted US showed no gallstones, but hepatic steatosis noted Gastric emptying study normal She was referred to general surgery and had hernia repair with Nissen fundoplication in September 2017.  She has been doing quite well since that time and had resolution of her symptoms, however they recurred about 2 months ago. She now has epigastric discomfort and cramping which bothers her a few times a week, lasts 30-45 minutes at a time. It appears to occur randomly, she can't think of any specific triggers to this. She denies any nausea or vomiting. She does have some dysphagia and feels as though things are hard to get down at times. She has had some reflux symptoms and has been using Nexium 40 mg twice daily but has not had any improvement in symptoms. Of note she does use diclofenac every day for pain which her primary care had placed her on, she also uses ibuprofen on top of this a few times per week. She denies any new bowel habit changes. She is using Linzess which is working well for her constipation. She uses NSAIDs for chronic knee pain.    Past Medical History:  Diagnosis Date  . Fatty liver   . Fatty liver   . GERD (gastroesophageal reflux disease)   . Hiatal hernia   . Hypertension   . Hypokalemia   . OA (osteoarthritis)   . Plantar fasciitis   . Vitamin D deficiency      Past Surgical History:  Procedure Laterality Date  . CESAREAN SECTION  1993  . FOOT SURGERY Right 1997  . INSERTION OF MESH N/A 01/28/2016   Procedure: INSERTION OF MESH;  Surgeon: Axel Filler, MD;  Location: [facility];  Service: General;  Laterality: N/A;  . Right Knee Surgery    . tubal ligation     Family History  Problem Relation Age of Onset  . High blood pressure  Mother   . Diabetes Mother   . Kidney cancer Mother   . Colon cancer Neg Hx    Social History   Tobacco Use  . Smoking status: Never Smoker  . Smokeless tobacco: Never Used  Substance Use Topics  . Alcohol use: No    Alcohol/week: 0.0 standard drinks  . Drug use: No   Current Outpatient Medications  Medication Sig Dispense Refill  . acetaminophen-codeine (TYLENOL #3) 300-30 MG tablet Take 1-2 tablets by mouth every 6 (six) hours as needed for moderate pain. 90 tablet 5  . citalopram (CELEXA) 20 MG tablet Take 1 tablet (20 mg total) by mouth daily. 90 tablet 3  . clonazePAM (KLONOPIN) 0.25 MG disintegrating tablet TAKE 1 TABLET (0.25 MG TOTAL) BY MOUTH NIGHTLY. AS DIRECTED FOR JERKING  5  . cyclobenzaprine (FLEXERIL) 10 MG tablet Take 1 tablet (10 mg total) by mouth 3 (three) times daily as needed for muscle spasms. 30 tablet 0  . diclofenac (VOLTAREN) 75 MG EC tablet Take 1-2 tablets daily 180 tablet 1  . esomeprazole (NEXIUM) 40 MG capsule Take 1 capsule (40 mg total) by mouth 2 (two) times daily before a meal. 180 capsule 1  . hydrochlorothiazide (HYDRODIURIL) 25 MG tablet Take 1 tablet (25 mg total) by mouth daily with breakfast. 90 tablet 3  . ibuprofen (ADVIL,MOTRIN) 800 MG tablet Take 1 tablet (800  mg total) by mouth at bedtime. 90 tablet 0  . linaclotide (LINZESS) 145 MCG CAPS capsule Take 1 capsule (145 mcg total) by mouth daily before breakfast. 30 capsule 5  . loratadine (CLARITIN) 10 MG tablet TAKE 1 TABLET BY MOUTH EVERY DAY FOR ALLERGIES 30 tablet 4  . metoprolol succinate (TOPROL-XL) 25 MG 24 hr tablet TAKE 1/2 TO 1 TABLET DAILY 90 tablet 1  . potassium chloride SA (KLOR-CON M20) 20 MEQ tablet TAKE 1 TABLET ONCE A DAY ORALLY 30 DAYS 90 tablet 3  . pramipexole (MIRAPEX) 0.25 MG tablet Take by mouth.    Marland Kitchen PROAIR HFA 108 (90 Base) MCG/ACT inhaler INHALE 2 PUFFS AS NEEDED EVERY 4 HOURS 8.5 Inhaler 2  . topiramate (TOPAMAX) 50 MG tablet Take 1-2 tablets (50-100 mg total) by  mouth daily. 60 tablet 1  . Vitamin D, Ergocalciferol, (DRISDOL) 50000 units CAPS capsule Take 1 capsule (50,000 Units total) by mouth 2 (two) times a week. Takes on Sunday's and Wednesday's 24 capsule 3   No current facility-administered medications for this visit.    No Known Allergies   Review of Systems: All systems reviewed and negative except where noted in HPI.   Lab Results  Component Value Date   WBC 8.5 11/28/2016   HGB 13.0 11/28/2016   HCT 40.0 11/28/2016   MCV 92 11/28/2016   PLT 295 11/28/2016    Lab Results  Component Value Date   CREATININE 0.71 11/28/2016   BUN 19 11/28/2016   NA 142 11/28/2016   K 3.5 11/28/2016   CL 101 11/28/2016   CO2 20 11/28/2016    Lab Results  Component Value Date   ALT 13 11/28/2016   AST 15 11/28/2016   ALKPHOS 95 11/28/2016   BILITOT 0.2 11/28/2016     Physical Exam: BP 124/90 (BP Location: Right Arm, Patient Position: Sitting, Cuff Size: Normal)   Pulse 88   Ht 5\' 2"  (1.575 m)   Wt 235 lb (106.6 kg)   LMP 02/13/2018   BMI 42.98 kg/m  Constitutional: Pleasant,well-developed, female in no acute distress. HEENT: Normocephalic and atraumatic. Conjunctivae are normal. No scleral icterus. Neck supple.  Cardiovascular: Normal rate, regular rhythm.  Pulmonary/chest: Effort normal and breath sounds normal. No wheezing, rales or rhonchi. Abdominal: Soft, nondistended, nontender - could not reproduce symptoms. No hepatomegaly. Extremities: no edema Lymphadenopathy: No cervical adenopathy noted. Neurological: Alert and oriented to person place and time. Skin: Skin is warm and dry. No rashes noted. Psychiatric: Normal mood and affect. Behavior is normal.   ASSESSMENT AND PLAN: 47 year old female here for reassessment of the following issues:  Epigastric pain / dysphagia / history of hiatal hernia with repair and Nissen fundoplication - she did well for a few years after her surgical repair, now with some recurrent  symptoms that she is concerned about for recurrent hernia. It also possible her frequent NSAID use could be contributing to the symptoms, although she is taking PPI and use of Nexium has not provided any relief. I offered her an upper endoscopy to further evaluate this in light of her history, ensure no recurrent hernia. I discussed the risks and benefits of endoscopy and anesthesia with her and she wanted to proceed. She has not eaten anything today we had an opening later today, she wanted to proceed as soon as possible. Further recommendations pending the results of the endoscopy. I think in general been good for her to minimize her NSAID use moving forward. She agreed with the plan  Epps Cellar, MD [facility]

## 2018-03-19 NOTE — Progress Notes (Signed)
Called to room to assist during endoscopic procedure.  Patient ID and intended procedure confirmed with present staff. Received instructions for my participation in the procedure from the performing physician.  

## 2018-03-19 NOTE — Patient Instructions (Signed)
YOU HAD AN ENDOSCOPIC PROCEDURE TODAY AT THE  ENDOSCOPY CENTER:   Refer to the procedure report that was given to you for any specific questions about what was found during the examination.  If the procedure report does not answer your questions, please call your gastroenterologist to clarify.  If you requested that your care partner not be given the details of your procedure findings, then the procedure report has been included in a sealed envelope for you to review at your convenience later.  YOU SHOULD EXPECT: Some feelings of bloating in the abdomen. Passage of more gas than usual.  Walking can help get rid of the air that was put into your GI tract during the procedure and reduce the bloating. If you had a lower endoscopy (such as a colonoscopy or flexible sigmoidoscopy) you may notice spotting of blood in your stool or on the toilet paper. If you underwent a bowel prep for your procedure, you may not have a normal bowel movement for a few days.  Please Note:  You might notice some irritation and congestion in your nose or some drainage.  This is from the oxygen used during your procedure.  There is no need for concern and it should clear up in a day or so.  SYMPTOMS TO REPORT IMMEDIATELY:   Following upper endoscopy (EGD)  Vomiting of blood or coffee ground material  New chest pain or pain under the shoulder blades  Painful or persistently difficult swallowing  New shortness of breath  Fever of 100F or higher  Black, tarry-looking stools  For urgent or emergent issues, a gastroenterologist can be reached at any hour by calling [phone].   DIET:  We do recommend a small meal at first, but then you may proceed to your regular diet.  Drink plenty of fluids but you should avoid alcoholic beverages for 24 hours.  ACTIVITY:  You should plan to take it easy for the rest of today and you should NOT DRIVE or use heavy machinery until tomorrow (because of the sedation medicines used  during the test).    FOLLOW UP: Our staff will call the number listed on your records the next business day following your procedure to check on you and address any questions or concerns that you may have regarding the information given to you following your procedure. If we do not reach you, we will leave a message.  However, if you are feeling well and you are not experiencing any problems, there is no need to return our call.  We will assume that you have returned to your regular daily activities without incident.  If any biopsies were taken you will be contacted by phone or by letter within the next 1-3 weeks.  Please call us at [phone] if you have not heard about the biopsies in 3 weeks.    SIGNATURES/CONFIDENTIALITY: You and/or your care partner have signed paperwork which will be entered into your electronic medical record.  These signatures attest to the fact that that the information above on your After Visit Summary has been reviewed and is understood.  Full responsibility of the confidentiality of this discharge information lies with you and/or your care-partner. 

## 2018-03-20 ENCOUNTER — Telehealth: Payer: Self-pay | Admitting: *Deleted

## 2018-03-20 NOTE — Telephone Encounter (Signed)
  Follow up Call-  Call back number 03/19/2018 10/20/2015  Post procedure Call Back phone  # [phone]  Permission to leave phone message Yes Yes  Some recent data might be hidden     Patient questions:  Do you have a fever, pain , or abdominal swelling? No. Pain Score  0 *  Have you tolerated food without any problems? Yes.    Have you been able to return to your normal activities? Yes.    Do you have any questions about your discharge instructions: Diet   No. Medications  No. Follow up visit  No.  Do you have questions or concerns about your Care? No.  Actions: * If pain score is 4 or above: No action needed, pain <4.

## 2018-03-22 ENCOUNTER — Other Ambulatory Visit: Payer: Self-pay | Admitting: Physician Assistant

## 2018-03-26 ENCOUNTER — Other Ambulatory Visit: Payer: Self-pay | Admitting: Physician Assistant

## 2018-03-26 ENCOUNTER — Other Ambulatory Visit: Payer: Self-pay

## 2018-03-26 ENCOUNTER — Telehealth: Payer: Self-pay | Admitting: Gastroenterology

## 2018-03-26 ENCOUNTER — Encounter: Payer: Self-pay | Admitting: Physician Assistant

## 2018-03-26 DIAGNOSIS — R1310 Dysphagia, unspecified: Secondary | ICD-10-CM

## 2018-03-26 DIAGNOSIS — R1013 Epigastric pain: Secondary | ICD-10-CM

## 2018-03-26 NOTE — Telephone Encounter (Signed)
Thanks for the information. That history is noted but should not change her risk for gastric cancer. Will await results of her barium study. Thanks

## 2018-03-26 NOTE — Telephone Encounter (Signed)
Routed to Dr. Armbruster. 

## 2018-03-26 NOTE — Telephone Encounter (Signed)
Patient advised and scheduled for barium swallow.

## 2018-04-05 ENCOUNTER — Encounter: Payer: Self-pay | Admitting: Physician Assistant

## 2018-04-09 ENCOUNTER — Ambulatory Visit (HOSPITAL_COMMUNITY)
Admission: RE | Admit: 2018-04-09 | Discharge: 2018-04-09 | Disposition: A | Discharge status: Home / Self Care | Payer: BLUE CROSS/BLUE SHIELD | Source: Ambulatory Visit | Attending: Gastroenterology | Admitting: Gastroenterology

## 2018-04-09 ENCOUNTER — Other Ambulatory Visit: Payer: Self-pay | Admitting: Physician Assistant

## 2018-04-09 DIAGNOSIS — R1310 Dysphagia, unspecified: Secondary | ICD-10-CM | POA: Diagnosis not present

## 2018-04-09 DIAGNOSIS — K449 Diaphragmatic hernia without obstruction or gangrene: Secondary | ICD-10-CM | POA: Diagnosis not present

## 2018-04-09 DIAGNOSIS — R1013 Epigastric pain: Secondary | ICD-10-CM | POA: Diagnosis not present

## 2018-04-17 ENCOUNTER — Other Ambulatory Visit: Payer: Self-pay | Admitting: Physician Assistant

## 2018-04-20 ENCOUNTER — Other Ambulatory Visit: Payer: Self-pay | Admitting: Physician Assistant

## 2018-04-20 DIAGNOSIS — I471 Supraventricular tachycardia: Secondary | ICD-10-CM

## 2018-04-22 NOTE — Telephone Encounter (Signed)
OV 04/26/18

## 2018-04-26 ENCOUNTER — Ambulatory Visit: Payer: BLUE CROSS/BLUE SHIELD | Admitting: Physician Assistant

## 2018-04-26 ENCOUNTER — Encounter: Payer: Self-pay | Admitting: Physician Assistant

## 2018-04-26 VITALS — BP 105/75 | HR 75 | Temp 97.7°F | Ht 62.0 in | Wt 220.2 lb

## 2018-04-26 DIAGNOSIS — Z931 Gastrostomy status: Secondary | ICD-10-CM

## 2018-04-26 DIAGNOSIS — K219 Gastro-esophageal reflux disease without esophagitis: Secondary | ICD-10-CM

## 2018-04-26 DIAGNOSIS — M47816 Spondylosis without myelopathy or radiculopathy, lumbar region: Secondary | ICD-10-CM

## 2018-04-26 MED ORDER — PANTOPRAZOLE SODIUM 40 MG PO TBEC: 40.0000 mg | DELAYED_RELEASE_TABLET | Freq: Two times a day (BID) | ORAL | 11 refills | Status: DC

## 2018-04-26 MED ORDER — LIRAGLUTIDE -WEIGHT MANAGEMENT 18 MG/3ML ~~LOC~~ SOPN: 2.0000 mg | PEN_INJECTOR | Freq: Every day | SUBCUTANEOUS | 5 refills | Status: DC

## 2018-04-26 NOTE — Progress Notes (Signed)
BP 105/75   Pulse 75   Temp 97.7 F (36.5 C) (Oral)   Ht 5\' 2"  (1.575 m)   Wt 220 lb 3.2 oz (99.9 kg)   LMP 04/06/2018   BMI 40.28 kg/m    Subjective:    Patient ID: Darlene [name], female    DOB: [birth year], 47 y.o.   MRN: [id number]  HPI: Darlene [name] is a 47 y.o. female presenting on 04/26/2018 for Hypertension  This patient comes in for recheck on her chronic medical conditions which do include hypertension, GERD.  She has a known hiatal hernia.  She will be having surgery next year for this problem.  She had had this before and it failed.  She is still having significant amount of symptoms at times.  One episode recently.  She is really done a good job change in her diet.  She is down 15 pounds.  She does need a refill on Saxenda she has been able to tolerate it well and it is helping overall with her weight loss efforts.  Past Medical History:  Diagnosis Date  . Fatty liver   . Fatty liver   . GERD (gastroesophageal reflux disease)   . Hiatal hernia   . Hypertension   . Hypokalemia   . OA (osteoarthritis)   . Plantar fasciitis   . Vitamin D deficiency    Relevant past medical, surgical, family and social history reviewed and updated as indicated. Interim medical history since our last visit reviewed. Allergies and medications reviewed and updated. DATA REVIEWED: CHART IN EPIC  Family History reviewed for pertinent findings.  Review of Systems  Constitutional: Negative.  Negative for activity change, fatigue and fever.  HENT: Negative.   Eyes: Negative.   Respiratory: Negative.  Negative for cough, shortness of breath and wheezing.   Cardiovascular: Negative.  Negative for chest pain.  Gastrointestinal: Positive for abdominal distention. Negative for abdominal pain and diarrhea.  Endocrine: Negative.   Genitourinary: Negative.  Negative for dysuria.  Musculoskeletal: Negative.   Skin: Negative.   Neurological: Negative.     Allergies as of 04/26/2018   No  Known Allergies     Medication List       Accurate as of April 26, 2018  3:56 PM. Always use your most recent med list.        acetaminophen-codeine 300-30 MG tablet Commonly known as:  TYLENOL #3 Take 1-2 tablets by mouth every 6 (six) hours as needed for moderate pain.   citalopram 20 MG tablet Commonly known as:  CELEXA Take 1 tablet (20 mg total) by mouth daily.   clonazePAM 0.25 MG disintegrating tablet Commonly known as:  KLONOPIN TAKE 1 TABLET (0.25 MG TOTAL) BY MOUTH NIGHTLY. AS DIRECTED FOR JERKING   cyclobenzaprine 10 MG tablet Commonly known as:  FLEXERIL Take 1 tablet (10 mg total) by mouth 3 (three) times daily as needed for muscle spasms.   diclofenac 75 MG EC tablet Commonly known as:  VOLTAREN Take 1-2 tablets daily   hydrochlorothiazide 25 MG tablet Commonly known as:  HYDRODIURIL Take 1 tablet (25 mg total) by mouth daily with breakfast.   ibuprofen 800 MG tablet Commonly known as:  ADVIL,MOTRIN Take 1 tablet (800 mg total) by mouth at bedtime.   linaclotide 145 MCG Caps capsule Commonly known as:  LINZESS Take 1 capsule (145 mcg total) by mouth daily before breakfast.   Liraglutide -Weight Management 18 MG/3ML Sopn Commonly known as:  SAXENDA Inject 2 mg into the skin  daily.   loratadine 10 MG tablet Commonly known as:  CLARITIN TAKE 1 TABLET BY MOUTH EVERY DAY FOR ALLERGIES   metoprolol succinate 25 MG 24 hr tablet Commonly known as:  TOPROL-XL TAKE 1/2 TO 1 TABLET DAILY   pantoprazole 40 MG tablet Commonly known as:  PROTONIX Take 1 tablet (40 mg total) by mouth 2 (two) times daily.   pramipexole 0.25 MG tablet Commonly known as:  MIRAPEX Take by mouth.   PROAIR HFA 108 (90 Base) MCG/ACT inhaler Generic drug:  albuterol INHALE 2 PUFFS AS NEEDED EVERY 4 HOURS   Vitamin D (Ergocalciferol) 1.25 MG (50000 UT) Caps capsule Commonly known as:  DRISDOL Take 1 capsule (50,000 Units total) by mouth 2 (two) times a week. Takes on  Sunday's and Wednesday's          Objective:    BP 105/75   Pulse 75   Temp 97.7 F (36.5 C) (Oral)   Ht 5\' 2"  (1.575 m)   Wt 220 lb 3.2 oz (99.9 kg)   LMP 04/06/2018   BMI 40.28 kg/m   No Known Allergies  Wt Readings from Last 3 Encounters:  04/26/18 220 lb 3.2 oz (99.9 kg)  03/19/18 235 lb (106.6 kg)  03/19/18 235 lb (106.6 kg)    Physical Exam Constitutional:      Appearance: She is well-developed.  HENT:     Head: Normocephalic and atraumatic.  Eyes:     Conjunctiva/sclera: Conjunctivae normal.     Pupils: Pupils are equal, round, and reactive to light.  Cardiovascular:     Rate and Rhythm: Normal rate and regular rhythm.     Heart sounds: Normal heart sounds.  Pulmonary:     Effort: Pulmonary effort is normal.     Breath sounds: Normal breath sounds.  Abdominal:     General: Bowel sounds are normal. There is no distension.     Palpations: Abdomen is soft. There is no mass.     Tenderness: There is no abdominal tenderness.     Hernia: No hernia is present.  Skin:    General: Skin is warm and dry.     Findings: No rash.  Neurological:     Mental Status: She is alert and oriented to person, place, and time.     Deep Tendon Reflexes: Reflexes are normal and symmetric.  Psychiatric:        Behavior: Behavior normal.        Thought Content: Thought content normal.        Judgment: Judgment normal.         Assessment & Plan:   1. S/P Nissen fundoplication (with gastrostomy tube placement) [facility]) Follow with gastroenterology and surgeon  2. Spondylosis of lumbar region without myelopathy or radiculopathy Continue with regular meds  3. Gastroesophageal reflux disease, esophagitis presence not specified Continue meds, pantoprazole 40 mg BID   Continue all other maintenance medications as listed above.  Follow up plan: Return in about 2 months (around 06/27/2018).  Educational handout given for survey  Remus Loffler PA-C [facility]  Family Medicine [address]  [place], [place] [address] [phone]   04/26/2018, 3:56 PM

## 2018-04-29 ENCOUNTER — Other Ambulatory Visit: Payer: Self-pay | Admitting: Physician Assistant

## 2018-04-29 DIAGNOSIS — I471 Supraventricular tachycardia: Secondary | ICD-10-CM

## 2018-04-29 MED ORDER — IBUPROFEN 800 MG PO TABS: 800.0000 mg | ORAL_TABLET | Freq: Every day | ORAL | 0 refills | Status: DC

## 2018-04-29 MED ORDER — METOPROLOL SUCCINATE ER 25 MG PO TB24: 12.5000 mg | ORAL_TABLET | Freq: Every day | ORAL | 1 refills | Status: DC

## 2018-04-29 NOTE — Telephone Encounter (Signed)
Last seen 04/26/18 

## 2018-05-26 ENCOUNTER — Other Ambulatory Visit: Payer: Self-pay | Admitting: Physician Assistant

## 2018-05-27 DIAGNOSIS — K449 Diaphragmatic hernia without obstruction or gangrene: Secondary | ICD-10-CM | POA: Diagnosis not present

## 2018-05-28 ENCOUNTER — Encounter: Payer: Self-pay | Admitting: Physician Assistant

## 2018-05-29 ENCOUNTER — Other Ambulatory Visit: Payer: Self-pay | Admitting: Physician Assistant

## 2018-06-03 ENCOUNTER — Other Ambulatory Visit: Payer: Self-pay | Admitting: General Surgery

## 2018-06-03 DIAGNOSIS — Z9889 Other specified postprocedural states: Secondary | ICD-10-CM

## 2018-06-07 ENCOUNTER — Ambulatory Visit
Admission: RE | Admit: 2018-06-07 | Discharge: 2018-06-07 | Disposition: A | Discharge status: Home / Self Care | Payer: BLUE CROSS/BLUE SHIELD | Source: Ambulatory Visit | Attending: General Surgery | Admitting: General Surgery

## 2018-06-07 DIAGNOSIS — Z9889 Other specified postprocedural states: Secondary | ICD-10-CM

## 2018-06-07 DIAGNOSIS — K449 Diaphragmatic hernia without obstruction or gangrene: Secondary | ICD-10-CM | POA: Diagnosis not present

## 2018-06-11 DIAGNOSIS — K449 Diaphragmatic hernia without obstruction or gangrene: Secondary | ICD-10-CM | POA: Diagnosis not present

## 2018-06-18 IMAGING — RF DG UGI W/ HIGH DENSITY W/KUB
10 of 13 series · 14 of 22 positions shown · non-contrast
Comparison: CT abdomen pelvis 11/08/2015

CLINICAL DATA: Hiatal hernia.  Dysphagia.

EXAM:
UPPER GI SERIES WITH KUB
TECHNIQUE: After obtaining a scout radiograph a routine upper GI series was
performed using thin and high density barium.
FLUOROSCOPY TIME:  Radiation Exposure Index (as provided by the
fluoroscopic device):
If the device does not provide the exposure index:
Fluoroscopy Time (in minutes and seconds):  2 Min and 0 seconds
Number of Acquired Images:

[Series 1: t abdomen supine · 0.15mm/px · 1 of 1 slices shown]
[im 1/1]
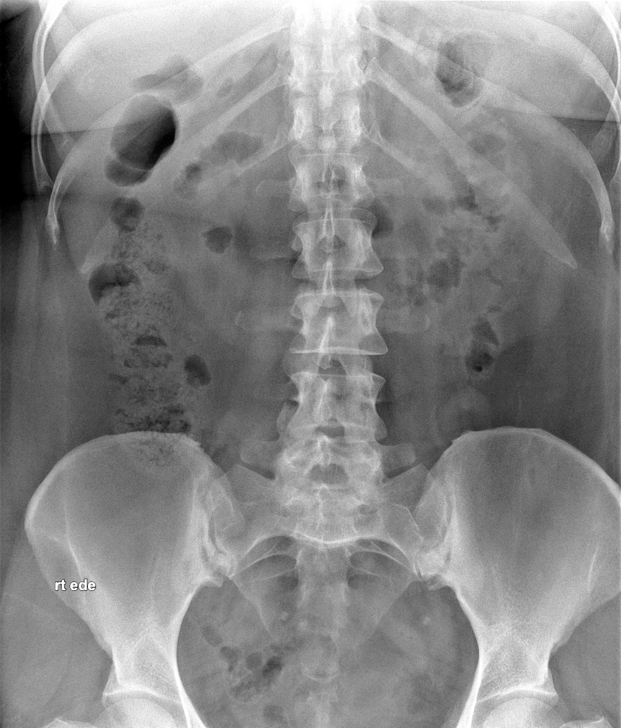

[Series 2: cp_standard · 0.38mm/px · 2 of 108 frames shown (1 of 3)]
[frame 48/108]
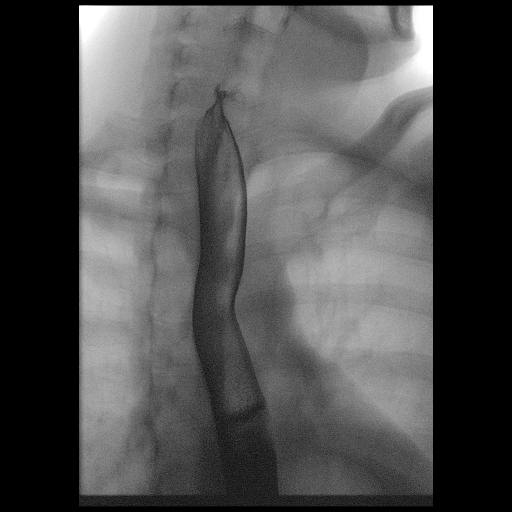
[frame 55/108]
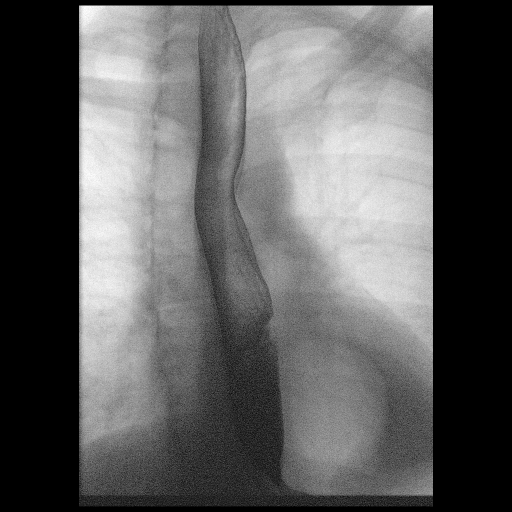

[Series 3: cp_standard · 0.38mm/px · 3 of 112 frames shown (2 of 3)]
[frame 17/112]
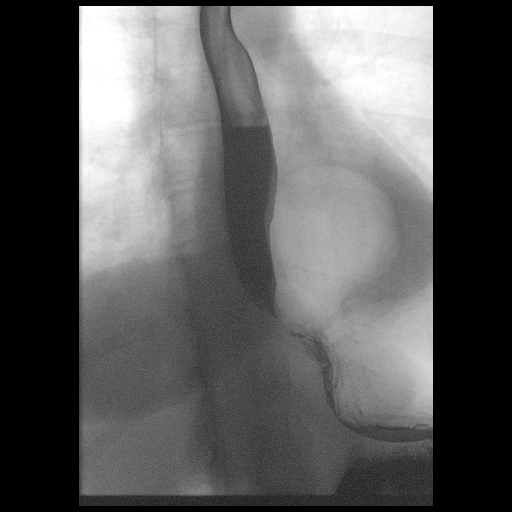
[frame 80/112]
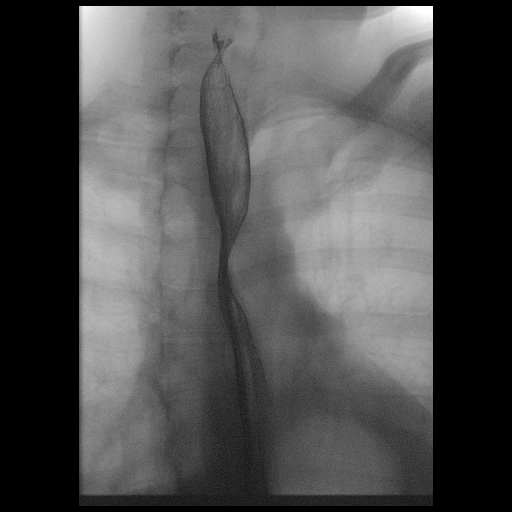
[frame 96/112]
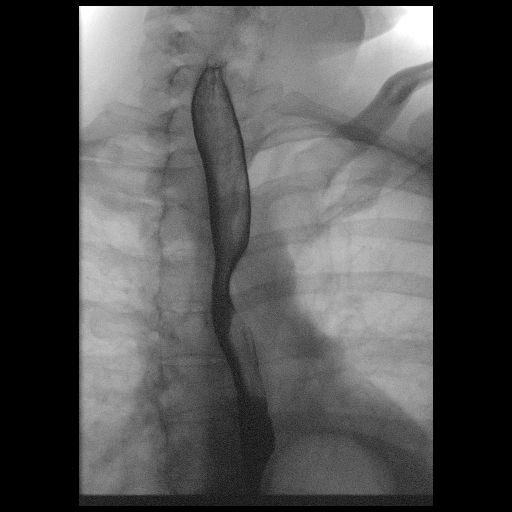

[Series 5: fluoro_barium singleshot_bw · 0.20mm/px · 1 of 1 slices shown (1 of 6)]
[im 1/1]
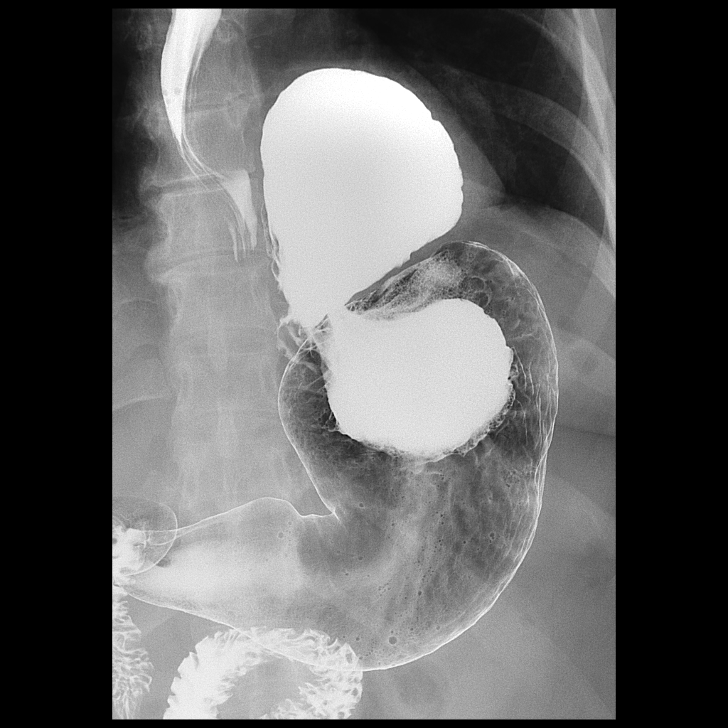

[Series 6: fluoro_barium singleshot_bw · 0.20mm/px · 1 of 1 slices shown (2 of 6)]
[im 1/1]
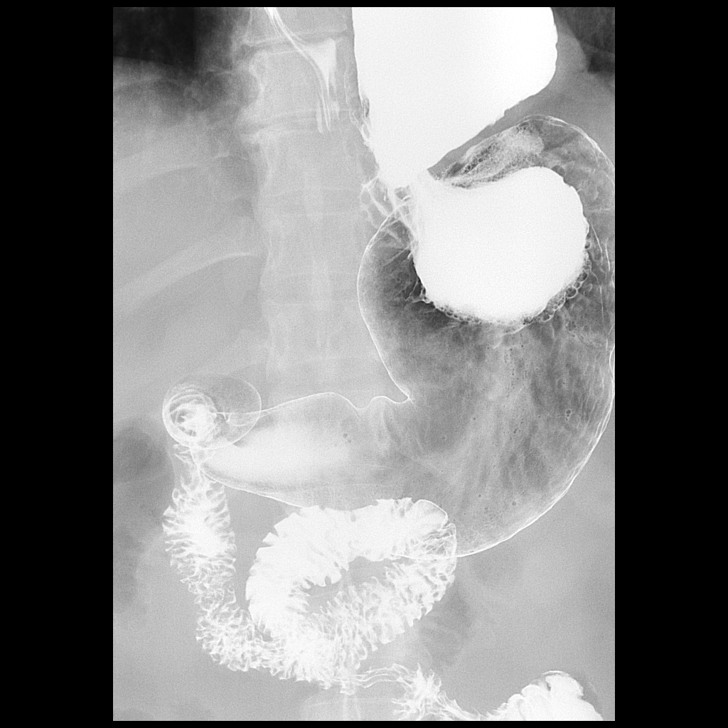

[Series 8: fluoro_barium singleshot_bw · 0.20mm/px · 1 of 1 slices shown (3 of 6)]
[im 1/1]
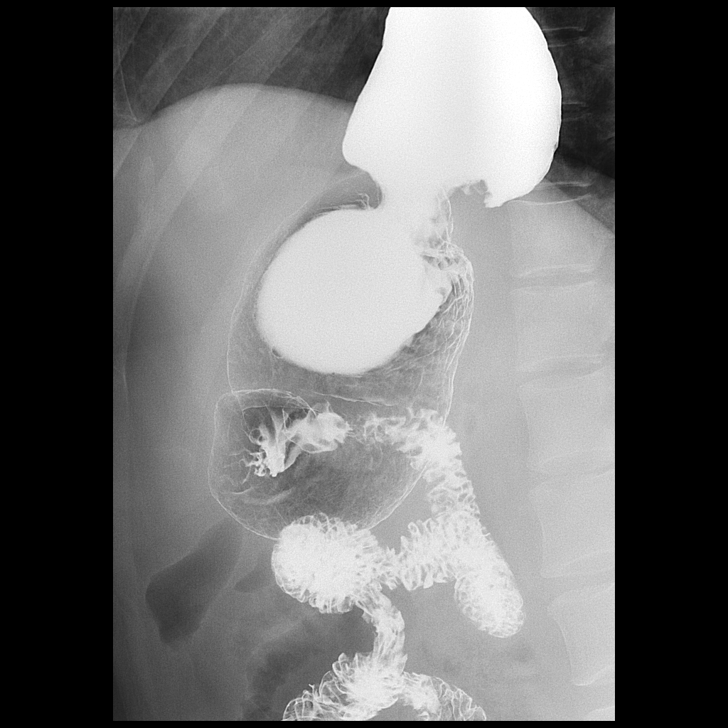

[Series 9: fluoro_barium singleshot_bw · 0.21mm/px · 1 of 1 slices shown (4 of 6)]
[im 1/1]
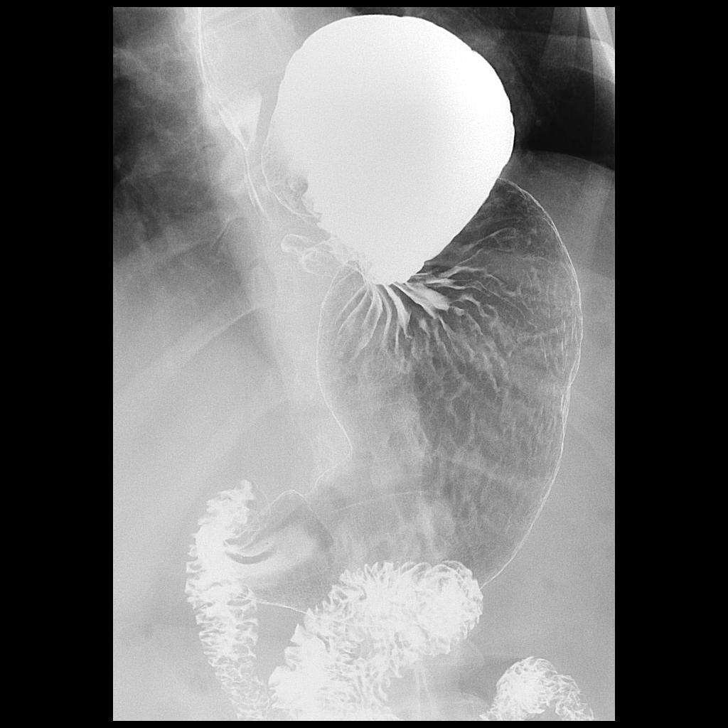

[Series 10: cp_standard · 0.41mm/px · 2 of 81 frames shown (3 of 3)]
[frame 41/81]
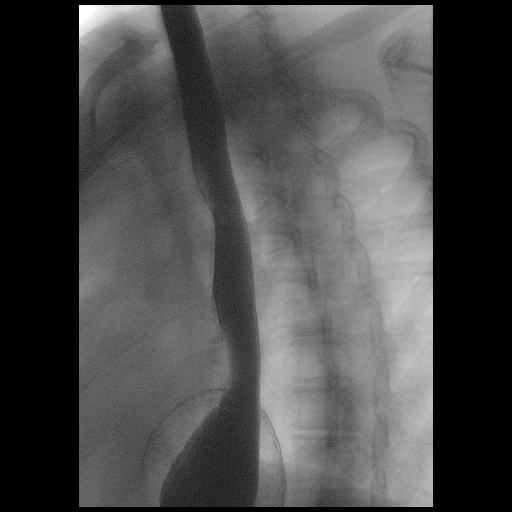
[frame 69/81]
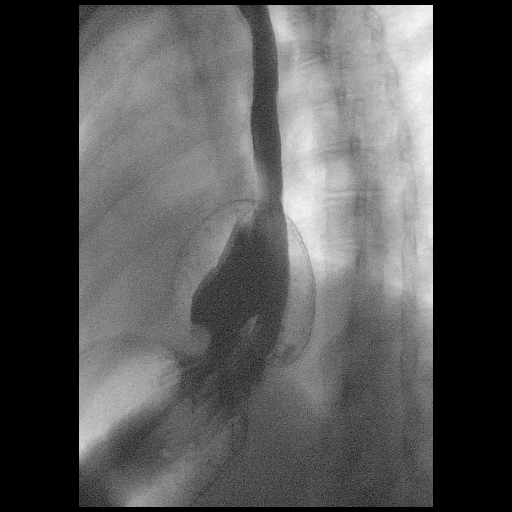

[Series 11: fluoro_barium singleshot_bw · 0.20mm/px · 1 of 1 slices shown (5 of 6)]
[im 1/1]
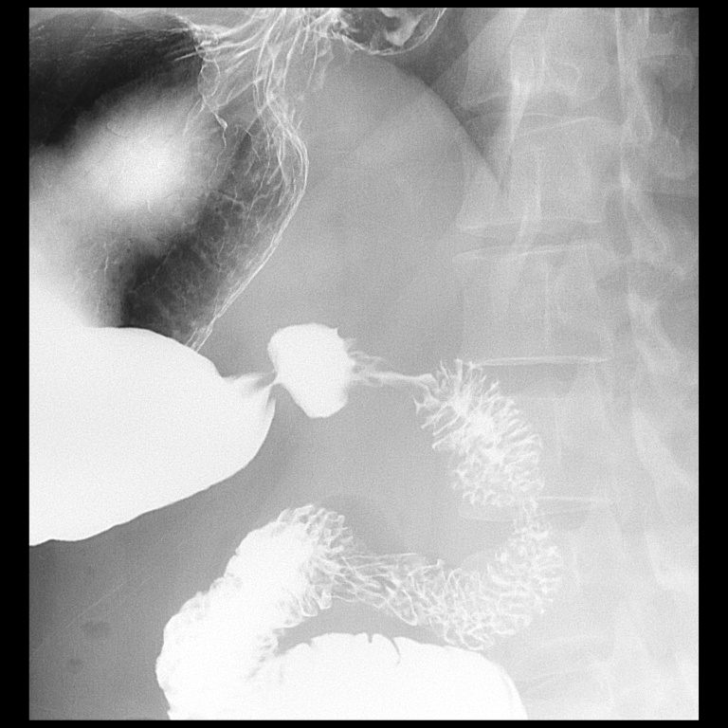

[Series 13: fluoro_barium singleshot_bw · 0.21mm/px · 1 of 1 slices shown (6 of 6)]
[im 1/1]
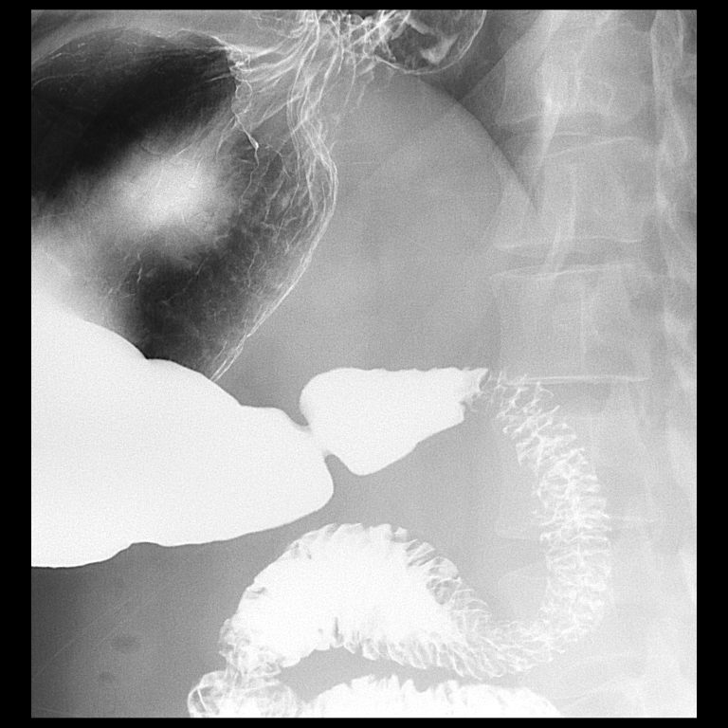

[14 of 22 positions shown; findings below may reference images not displayed]

FINDINGS: Preliminary KUB is within normal limits.

Esophageal mucosa and motility normal.

Large hiatal hernia. This appears to be a paraesophageal type
hernia. GE junction appears to be at the level the diaphragm with
hernia adjacent to the distal esophagus. No significant
gastroesophageal reflux.

Barium tablet passed readily into the stomach without delay

Gastric mucosa normal.  Stomach empties readily into the duodenum.

Duodenum bulb is normal.  No ulcer or mass or obstruction.
IMPRESSION: Large hiatal hernia which appears to be paraesophageal. No
esophageal stricture or reflux.

Normal stomach and duodenum.

## 2018-06-27 ENCOUNTER — Ambulatory Visit: Payer: Self-pay | Admitting: General Surgery

## 2018-06-27 NOTE — H&P (Signed)
History of Present Illness Axel Filler MD; 06/11/2018 4:13 PM) The patient is a 48 year old female who presents with a hiatal hernia. Patient is back in today for follow-up. Patient with CT scan which I reviewed personally. This shows a moderate size hiatal hernia recurrence. There appears to be herniated stomach within the posterior mediastinum.  Patient states she continues to have dysphagia. She stated wafer breath and possible. She does state that she can eat some certain meats without any dysphagia.  It appears dysphagia her main issue with minimal reflux on a PPI.   Allergies (Tanisha A. Manson Passey, RMA; 06/11/2018 4:01 PM) No Known Drug Allergies [11/12/2015]: Allergies Reconciled   Medication History (Tanisha A. Manson Passey, RMA; 06/11/2018 4:01 PM) ProAir HFA (108 (90 Base)MCG/ACT Aerosol Soln, Inhalation) Active. Lisinopril (5MG  Tablet, Oral) Active. Klor-Con M20 ( Tablet ER, Oral) Active. Vitamin D (Ergocalciferol) (50000UNIT Capsule, Oral twice weekly) Active. Loratadine (10MG  Tablet, Oral) Active. Esomeprazole Magnesium (40MG  Capsule DR, Oral) Active. HydroCHLOROthiazide (25MG  Tablet, Oral) Active. Ibuprofen (800MG  Tablet, Oral) Active. Acetaminophen-Codeine #3 (300-30MG  Tablet, Oral) Active. Metoprolol Succinate ER (25MG  Tablet ER 24HR, Oral) Active. Pantoprazole Sodium (40MG  Tablet DR, Oral) Active. Citalopram Hydrobromide (20MG  Tablet, Oral) Active. Diclofenac Sodium (75MG  Tablet DR, Oral) Active. clonazePAM (0.25MG  Tablet Disint, Oral) Active. Pramipexole Dihydrochloride (0.25MG  Tablet, Oral) Active. Cyclobenzaprine HCl (10MG  Tablet, Oral) Active. Medications Reconciled    Review of Systems Axel Filler, MD; 06/11/2018 4:15 PM) General Not Present- Appetite Loss, Chills, Fatigue, Fever, Night Sweats, Weight Gain and Weight Loss. Skin Not Present- Change in Wart/Mole, Dryness, Hives, Jaundice, New Lesions, Non-Healing Wounds, Rash and  Ulcer. HEENT Present- Wears glasses/contact lenses. Not Present- Earache, Hearing Loss, Hoarseness, Nose Bleed, Oral Ulcers, Ringing in the Ears, Seasonal Allergies, Sinus Pain, Sore Throat, Visual Disturbances and Yellow Eyes. Breast Not Present- Breast Mass, Breast Pain, Nipple Discharge and Skin Changes. Cardiovascular Not Present- Chest Pain, Difficulty Breathing Lying Down, Leg Cramps, Palpitations, Rapid Heart Rate, Shortness of Breath and Swelling of Extremities. Gastrointestinal Not Present- Abdominal Pain, Bloating, Bloody Stool, Change in Bowel Habits, Chronic diarrhea, Constipation, Difficulty Swallowing, Excessive gas, Gets full quickly at meals, Hemorrhoids, Indigestion, Nausea, Rectal Pain and Vomiting. Musculoskeletal Not Present- Back Pain, Joint Pain, Joint Stiffness, Muscle Pain, Muscle Weakness and Swelling of Extremities. Neurological Not Present- Decreased Memory, Fainting, Headaches, Numbness, Seizures, Tingling, Tremor, Trouble walking and Weakness. Psychiatric Not Present- Anxiety, Bipolar, Change in Sleep Pattern, Depression, Fearful and Frequent crying. Endocrine Not Present- Cold Intolerance, Excessive Hunger, Hair Changes, Heat Intolerance and New Diabetes. Hematology Not Present- Blood Thinners, Easy Bruising, Excessive bleeding, Gland problems, HIV and Persistent Infections. All other systems negative  Vitals (Tanisha A. Brown RMA; 06/11/2018 4:00 PM) 06/11/2018 4:00 PM Weight: 217.2 lb Height: 62.5in Body Surface Area: 1.99 m Body Mass Index: 39.09 kg/m  Temp.: 98.26F  Pulse: 100 (Regular)  BP: 122/78 (Sitting, Left Arm, Standard)       Physical Exam Axel Filler, MD; 06/11/2018 4:15 PM) The physical exam findings are as follows: Note:Constitutional: No acute distress, conversant, appears stated age  Eyes: Anicteric sclerae, moist conjunctiva, no lid lag  Neck: No thyromegaly, trachea midline, no cervical lymphadenopathy  Lungs: Clear to  auscultation biilaterally, normal respiratory effot  Cardiovascular: regular rate & rhythm, no murmurs, no peripheal edema, pedal pulses 2+  GI: Soft, no masses or hepatosplenomegaly, non-tender to palpation  MSK: Normal gait, no clubbing cyanosis, edema  Skin: No rashes, palpation reveals normal skin turgor  Psychiatric: Appropriate judgment and insight, oriented to person, place, and  time    Assessment & Plan Axel Filler(Reon Hunley MD; 06/11/2018 4:14 PM) STATUS POST NISSEN FUNDOPLICATION (Z98.890) HIATAL HERNIA (K44.9) Impression: Patient is a 48 year old female with a recurrent hiatal hernia. 1. Will proceed to the operating room for a robotic hiatal hernia repair with mesh and toupet fundoplication  2. Discussed with patient the risks and benefits of the procedure to include but not limited to: Infection, bleeding, damage to structures, possible pneumothorax, possible recurrence. The patient voiced understanding and wishes to proceed.

## 2018-07-01 ENCOUNTER — Ambulatory Visit: Payer: BLUE CROSS/BLUE SHIELD | Admitting: Physician Assistant

## 2018-07-07 ENCOUNTER — Other Ambulatory Visit: Payer: Self-pay | Admitting: Physician Assistant

## 2018-07-08 NOTE — Telephone Encounter (Signed)
No Vit D in EPIC 

## 2018-07-09 ENCOUNTER — Ambulatory Visit: Payer: BLUE CROSS/BLUE SHIELD | Admitting: Physician Assistant

## 2018-07-09 VITALS — BP 114/69 | HR 85 | Temp 99.1°F | Ht 62.0 in | Wt 219.0 lb

## 2018-07-09 DIAGNOSIS — M47816 Spondylosis without myelopathy or radiculopathy, lumbar region: Secondary | ICD-10-CM

## 2018-07-09 DIAGNOSIS — Z931 Gastrostomy status: Secondary | ICD-10-CM | POA: Diagnosis not present

## 2018-07-09 DIAGNOSIS — K449 Diaphragmatic hernia without obstruction or gangrene: Secondary | ICD-10-CM

## 2018-07-09 DIAGNOSIS — M159 Polyosteoarthritis, unspecified: Secondary | ICD-10-CM

## 2018-07-09 DIAGNOSIS — M722 Plantar fascial fibromatosis: Secondary | ICD-10-CM

## 2018-07-09 DIAGNOSIS — F411 Generalized anxiety disorder: Secondary | ICD-10-CM

## 2018-07-09 MED ORDER — LIRAGLUTIDE -WEIGHT MANAGEMENT 18 MG/3ML ~~LOC~~ SOPN: 2.0000 mg | PEN_INJECTOR | Freq: Every day | SUBCUTANEOUS | 5 refills | Status: DC

## 2018-07-09 MED ORDER — ACETAMINOPHEN-CODEINE #3 300-30 MG PO TABS: 1.0000 | ORAL_TABLET | Freq: Four times a day (QID) | ORAL | 5 refills | Status: DC | PRN

## 2018-07-10 ENCOUNTER — Encounter: Payer: Self-pay | Admitting: Physician Assistant

## 2018-07-10 NOTE — Progress Notes (Signed)
BP 114/69   Pulse 85   Temp 99.1 F (37.3 C) (Oral)   Ht 5\' 2"  (1.575 m)   Wt 219 lb (99.3 kg)   BMI 40.06 kg/m    Subjective:    Patient ID: Darlene [name], female    DOB: January 13, 1971, 48 y.o.   MRN: [id number]  HPI: Darlene [name] is a 48 y.o. female presenting on 07/09/2018 for Anxiety (3 month )  This patient comes in for periodic recheck on her chronic medical conditions.  They do include hiatal hernia with a failed Nissen fundoplication procedure.  She is scheduled to have another one in a couple of months.  She is having a lot of pain through the day when she is working.  However the cannot get the appointment moved up any sooner.  She also has other generalized arthritis and plantar fasciitis.  She does take an occasional Tylenol with codeine for the severe pain she is a very rare user.  Her narcotic registry shows her to use 18 MME or less and she does not even fill it that often.  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  She is also working on weight loss.  She is down about 16 pounds since November.  She has done very well with the Saxenda.  We need to do refills on this.  She is tolerating the medication well and is working hard on losing weight.  Past Medical History:  Diagnosis Date  . Fatty liver   . Fatty liver   . GERD (gastroesophageal reflux disease)   . Hiatal hernia   . Hypertension   . Hypokalemia   . OA (osteoarthritis)   . Plantar fasciitis   . Vitamin D deficiency    Relevant past medical, surgical, family and social history reviewed and updated as indicated. Interim medical history since our last visit reviewed. Allergies and medications reviewed and updated. DATA REVIEWED: CHART IN EPIC  Family History reviewed for pertinent findings.  Review of Systems  Constitutional: Negative.   HENT: Negative.   Eyes: Negative.   Respiratory: Negative.   Gastrointestinal: Negative.   Genitourinary: Negative.   Musculoskeletal: Positive for arthralgias,  back pain, myalgias and neck pain.  Psychiatric/Behavioral: The patient is nervous/anxious.     Allergies as of 07/09/2018   No Known Allergies     Medication List       Accurate as of July 09, 2018 11:59 PM. Always use your most recent med list.        acetaminophen-codeine 300-30 MG tablet Commonly known as:  TYLENOL #3 Take 1-2 tablets by mouth every 6 (six) hours as needed for moderate pain.   citalopram 20 MG tablet Commonly known as:  CELEXA Take 1 tablet (20 mg total) by mouth daily.   clonazePAM 0.25 MG disintegrating tablet Commonly known as:  KLONOPIN TAKE 1 TABLET (0.25 MG TOTAL) BY MOUTH NIGHTLY. AS DIRECTED FOR JERKING   cyclobenzaprine 10 MG tablet Commonly known as:  FLEXERIL Take 1 tablet (10 mg total) by mouth 3 (three) times daily as needed for muscle spasms.   diclofenac 75 MG EC tablet Commonly known as:  VOLTAREN Take 1-2 tablets daily   hydrochlorothiazide 25 MG tablet Commonly known as:  HYDRODIURIL Take 1 tablet (25 mg total) by mouth daily with breakfast.   ibuprofen 800 MG tablet Commonly known as:  ADVIL,MOTRIN TAKE 1 TABLET (800 MG TOTAL) BY MOUTH 3 (THREE) TIMES DAILY AS NEEDED FOR HEADACHE.   linaclotide  145 MCG Caps capsule Commonly known as:  LINZESS Take 1 capsule (145 mcg total) by mouth daily before breakfast.   Liraglutide -Weight Management 18 MG/3ML Sopn Commonly known as:  SAXENDA Inject 2 mg into the skin daily.   loratadine 10 MG tablet Commonly known as:  CLARITIN TAKE 1 TABLET BY MOUTH EVERY DAY FOR ALLERGIES   metoprolol succinate 25 MG 24 hr tablet Commonly known as:  TOPROL-XL Take 0.5-1 tablets (12.5-25 mg total) by mouth daily.   pantoprazole 40 MG tablet Commonly known as:  PROTONIX Take 1 tablet (40 mg total) by mouth 2 (two) times daily.   potassium chloride SA 20 MEQ tablet Commonly known as:  KLOR-CON M20 TAKE 1 TABLET ONCE A DAY ORALLY 30 DAYS   pramipexole 0.25 MG tablet Commonly known as:   MIRAPEX Take by mouth.   PROAIR HFA 108 (90 Base) MCG/ACT inhaler Generic drug:  albuterol INHALE 2 PUFFS AS NEEDED EVERY 4 HOURS   Vitamin D (Ergocalciferol) 1.25 MG (50000 UT) Caps capsule Commonly known as:  DRISDOL TAKE 1 CAPSULE (50,000 UNITS TOTAL) BY MOUTH 2 (TWO) TIMES A WEEK. TAKES ON SUNDAY'S AND WEDNESDAY'S          Objective:    BP 114/69   Pulse 85   Temp 99.1 F (37.3 C) (Oral)   Ht 5\' 2"  (1.575 m)   Wt 219 lb (99.3 kg)   BMI 40.06 kg/m   No Known Allergies  Wt Readings from Last 3 Encounters:  07/09/18 219 lb (99.3 kg)  04/26/18 220 lb 3.2 oz (99.9 kg)  03/19/18 235 lb (106.6 kg)    Physical Exam Constitutional:      Appearance: She is well-developed.  HENT:     Head: Normocephalic and atraumatic.  Eyes:     Conjunctiva/sclera: Conjunctivae normal.     Pupils: Pupils are equal, round, and reactive to light.  Cardiovascular:     Rate and Rhythm: Normal rate and regular rhythm.     Heart sounds: Normal heart sounds.  Pulmonary:     Effort: Pulmonary effort is normal.     Breath sounds: Normal breath sounds.  Abdominal:     General: Bowel sounds are normal.     Palpations: Abdomen is soft.  Skin:    General: Skin is warm and dry.     Findings: No rash.  Neurological:     Mental Status: She is alert and oriented to person, place, and time.     Deep Tendon Reflexes: Reflexes are normal and symmetric.  Psychiatric:        Behavior: Behavior normal.        Thought Content: Thought content normal.        Judgment: Judgment normal.     LABS FROM work     Assessment & Plan:   1. Hiatal hernia Anticipate surgery  2. Spondylosis of lumbar region without myelopathy or radiculopathy - acetaminophen-codeine (TYLENOL #3) 300-30 MG tablet; Take 1-2 tablets by mouth every 6 (six) hours as needed for moderate pain.  Dispense: 90 tablet; Refill: 5  3. GAD (generalized anxiety disorder) Use alprazolam on a very rare basis.  4. S/P Nissen  fundoplication (with gastrostomy tube placement) (HCC)  5. Generalized OA - acetaminophen-codeine (TYLENOL #3) 300-30 MG tablet; Take 1-2 tablets by mouth every 6 (six) hours as needed for moderate pain.  Dispense: 90 tablet; Refill: 5  6. Plantar fasciitis - acetaminophen-codeine (TYLENOL #3) 300-30 MG tablet; Take 1-2 tablets by mouth every 6 (six)  hours as needed for moderate pain.  Dispense: 90 tablet; Refill: 5  7. Morbid obesity (HCC) - Liraglutide -Weight Management (SAXENDA) 18 MG/3ML SOPN; Inject 2 mg into the skin daily.  Dispense: 6 mL; Refill: 5   Continue all other maintenance medications as listed above.  Follow up plan: Return in about 3 months (around 10/07/2018).  Educational handout given for survey  Remus Loffler PA-C [facility] [address]  [place], [place] [address] [phone]   07/10/2018, 4:59 PM

## 2018-07-22 DIAGNOSIS — G4761 Periodic limb movement disorder: Secondary | ICD-10-CM | POA: Diagnosis not present

## 2018-07-22 DIAGNOSIS — G253 Myoclonus: Secondary | ICD-10-CM | POA: Diagnosis not present

## 2018-08-13 ENCOUNTER — Encounter: Payer: Self-pay | Admitting: Physician Assistant

## 2018-08-14 ENCOUNTER — Other Ambulatory Visit: Payer: Self-pay | Admitting: Physician Assistant

## 2018-08-14 MED ORDER — PENICILLIN V POTASSIUM 500 MG PO TABS: 500.0000 mg | ORAL_TABLET | Freq: Four times a day (QID) | ORAL | 1 refills | Status: DC

## 2018-09-02 NOTE — Patient Instructions (Addendum)
Darlene [name]  09/02/2018   Your procedure is scheduled on: 09-06-18    Report to [facility] Main  Entrance    Report to Admitting at 5:30 AM    Call this number if you have problems the morning of surgery [phone]    Remember: Do not eat food or drink liquids :After Midnight.    BRUSH YOUR TEETH MORNING OF SURGERY AND RINSE YOUR MOUTH OUT, NO CHEWING GUM [place] OR MINTS.     Take these medicines the morning of surgery with A SIP OF WATER: Citalopram (Celexa), Loratadine (Claritin), and Metoprolol Succinate (Toprol), Pantoprazole (Protonix)                                You may not have any metal on your body including hair pins and              piercings  Do not wear jewelry, make-up, lotions, powders or perfumes, deodorant             Do not wear nail polish.  Do not shave  48 hours prior to surgery.     Do not bring valuables to the hospital. [facility] IS NOT             RESPONSIBLE   FOR VALUABLES.  Contacts, dentures or bridgework may not be worn into surgery.  Leave suitcase in the car. After surgery it may be brought to your room.     Patients discharged the day of surgery will not be allowed to drive home. IF YOU ARE HAVING SURGERY AND GOING HOME THE SAME DAY, YOU MUST HAVE AN ADULT TO DRIVE YOU HOME AND BE WITH YOU FOR 24 HOURS. YOU MAY GO HOME BY TAXI OR UBER OR ORTHERWISE, BUT AN ADULT MUST ACCOMPANY YOU HOME AND STAY WITH YOU FOR 24 HOURS.    Special Instructions: N/A              Please read over the following fact sheets you were given: _____________________________________________________________________             [facility] - Preparing for Surgery Before surgery, you can play an important role.  Because skin is not sterile, your skin needs to be as free of germs as possible.  You can reduce the number of germs on your skin by washing with CHG (chlorahexidine gluconate) soap before surgery.  CHG is an antiseptic cleaner  which kills germs and bonds with the skin to continue killing germs even after washing. Please DO NOT use if you have an allergy to CHG or antibacterial soaps.  If your skin becomes reddened/irritated stop using the CHG and inform your nurse when you arrive at Short Stay. Do not shave (including legs and underarms) for at least 48 hours prior to the first CHG shower.  You may shave your face/neck. Please follow these instructions carefully:  1.  Shower with CHG Soap the night before surgery and the  morning of Surgery.  2.  If you choose to wash your hair, wash your hair first as usual with your  normal  shampoo.  3.  After you shampoo, rinse your hair and body thoroughly to remove the  shampoo.  4.  Use CHG as you would any other liquid soap.  You can apply chg directly  to the skin and wash                       Gently with a scrungie or clean washcloth.  5.  Apply the CHG Soap to your body ONLY FROM THE NECK DOWN.   Do not use on face/ open                           Wound or open sores. Avoid contact with eyes, ears mouth and genitals (private parts).                       Wash face,  Genitals (private parts) with your normal soap.             6.  Wash thoroughly, paying special attention to the area where your surgery  will be performed.  7.  Thoroughly rinse your body with warm water from the neck down.  8.  DO NOT shower/wash with your normal soap after using and rinsing off  the CHG Soap.                9.  Pat yourself dry with a clean towel.            10.  Wear clean pajamas.            11.  Place clean sheets on your bed the night of your first shower and do not  sleep with pets. Day of Surgery : Do not apply any lotions/deodorants the morning of surgery.  Please wear clean clothes to the hospital/surgery center.  FAILURE TO FOLLOW THESE INSTRUCTIONS MAY RESULT IN THE CANCELLATION OF YOUR SURGERY PATIENT SIGNATURE_________________________________  NURSE  SIGNATURE__________________________________  ________________________________________________________________________

## 2018-09-03 ENCOUNTER — Inpatient Hospital Stay (HOSPITAL_COMMUNITY): Admission: RE | Admit: 2018-09-03 | Payer: BLUE CROSS/BLUE SHIELD | Source: Ambulatory Visit

## 2018-09-03 ENCOUNTER — Encounter (HOSPITAL_COMMUNITY)
Admission: RE | Admit: 2018-09-03 | Discharge: 2018-09-03 | Disposition: A | Discharge status: Home / Self Care | Payer: BLUE CROSS/BLUE SHIELD | Source: Ambulatory Visit | Attending: General Surgery | Admitting: General Surgery

## 2018-09-03 ENCOUNTER — Encounter (HOSPITAL_COMMUNITY): Payer: Self-pay

## 2018-09-03 ENCOUNTER — Other Ambulatory Visit: Payer: Self-pay

## 2018-09-03 DIAGNOSIS — I10 Essential (primary) hypertension: Secondary | ICD-10-CM | POA: Insufficient documentation

## 2018-09-03 DIAGNOSIS — K449 Diaphragmatic hernia without obstruction or gangrene: Secondary | ICD-10-CM | POA: Insufficient documentation

## 2018-09-03 DIAGNOSIS — Z01818 Encounter for other preprocedural examination: Secondary | ICD-10-CM | POA: Insufficient documentation

## 2018-09-03 LAB — BASIC METABOLIC PANEL
Anion gap: 10 (ref 5–15)
BUN: 25 mg/dL — ABNORMAL HIGH (ref 6–20)
CO2: 24 mmol/L (ref 22–32)
Calcium: 9.1 mg/dL (ref 8.9–10.3)
Chloride: 103 mmol/L (ref 98–111)
Creatinine, Ser: 0.87 mg/dL (ref 0.44–1.00)
GFR calc Af Amer: >60 mL/min (ref >60)
GFR calc non Af Amer: >60 mL/min (ref >60)
Glucose, Bld: 92 mg/dL (ref 70–99)
Potassium: 3.3 mmol/L — ABNORMAL LOW (ref 3.5–5.1)
Sodium: 137 mmol/L (ref 135–145)

## 2018-09-03 LAB — HCG, SERUM, QUALITATIVE: Preg, Serum: NEGATIVE

## 2018-09-03 LAB — CBC
HCT: 43.5 % (ref 36.0–46.0)
Hemoglobin: 14.3 g/dL (ref 12.0–15.0)
MCH: 30.3 pg (ref 26.0–34.0)
MCHC: 32.9 g/dL (ref 30.0–36.0)
MCV: 92.2 fL (ref 80.0–100.0)
Platelets: 252 10*3/uL (ref 150–400)
RBC: 4.72 MIL/uL (ref 3.87–5.11)
RDW: 11.9 % (ref 11.5–15.5)
WBC: 7.8 10*3/uL (ref 4.0–10.5)
nRBC: 0 % (ref 0.0–0.2)

## 2018-09-04 NOTE — Progress Notes (Signed)
Anesthesia Chart Review   Case:  [id number] Date/Time:  09/06/18 0700   Procedure:  XI ROBOTIC HIATAL HERNIA WITH MESH AND FUNDOPLICATION (N/A )   Anesthesia type:  General   Pre-op diagnosis:  recurrent hiatel hernia   Location:  [facility] ROOM 02 / [facility]   Surgeon:  Axel Filler, MD      DISCUSSION: 48 yo never smoker with h/o GERD, HTN, recurrent hiatal hernia (s/p Nissen fundoplication) scheduled for above procedure 09/06/18 with Dr. Axel Filler.   Pt can proceed with planned procedure barring acute status change.  VS: BP 127/88 (BP Location: Right Arm)   Pulse 87   Temp 36.7 C (Oral)   Resp 18   Ht 5' (1.524 m)   Wt 93.6 kg   LMP 08/10/2018 (Approximate)   SpO2 98%   BMI 40.29 kg/m   PROVIDERS: Remus Loffler, PA-C is PCP last seen 07/09/18    LABS: Labs reviewed: Acceptable for surgery. (all labs ordered are listed, but only abnormal results are displayed)  Labs Reviewed  BASIC METABOLIC PANEL - Abnormal; Notable for the following components:      Result Value   Potassium 3.3 (*)    BUN 25 (*)    All other components within normal limits  CBC  HCG, SERUM, QUALITATIVE     IMAGES: CT Abdomen 06/07/2018 IMPRESSION: Moderate sliding hiatal hernia identified.  No acute abnormality identified in the abdomen and pelvis.  EKG: 09/03/2018 Rate 83 bpm Normal sinus rhythm Minimal voltage criteria for LVH, may be normal variant Borderline ECG  CV:  Past Medical History:  Diagnosis Date  . Fatty liver   . Fatty liver   . GERD (gastroesophageal reflux disease)   . Hiatal hernia   . Hypertension   . Hypokalemia   . OA (osteoarthritis)   . Plantar fasciitis   . Vitamin D deficiency     Past Surgical History:  Procedure Laterality Date  . CESAREAN SECTION  1993  . FOOT SURGERY Right 1997  . INSERTION OF MESH N/A 01/28/2016   Procedure: INSERTION OF MESH;  Surgeon: Axel Filler, MD;  Location: [facility];  Service: General;  Laterality: N/A;  . Right  Knee Surgery    . tubal ligation      MEDICATIONS: . acetaminophen-codeine (TYLENOL #3) 300-30 MG tablet  . citalopram (CELEXA) 20 MG tablet  . clonazepam (KLONOPIN) 0.125 MG disintegrating tablet  . cyclobenzaprine (FLEXERIL) 10 MG tablet  . diclofenac (VOLTAREN) 75 MG EC tablet  . hydrochlorothiazide (HYDRODIURIL) 25 MG tablet  . ibuprofen (ADVIL,MOTRIN) 800 MG tablet  . linaclotide (LINZESS) 145 MCG CAPS capsule  . Liraglutide -Weight Management (SAXENDA) 18 MG/3ML SOPN  . loratadine (CLARITIN) 10 MG tablet  . metoprolol succinate (TOPROL-XL) 25 MG 24 hr tablet  . pantoprazole (PROTONIX) 40 MG tablet  . penicillin v potassium (VEETID) 500 MG tablet  . potassium chloride SA (KLOR-CON M20) 20 MEQ tablet  . pramipexole (MIRAPEX) 0.25 MG tablet  . PROAIR HFA 108 (90 Base) MCG/ACT inhaler  . Vitamin D, Ergocalciferol, (DRISDOL) 1.25 MG (50000 UT) CAPS capsule   No current facility-administered medications for this encounter.      Janey Genta [facility] Pre-Surgical Testing [phone] 09/04/18 3:51 PM

## 2018-09-05 NOTE — H&P (Signed)
History of Present Illness  The patient is a 48 year old female who presents with a hiatal hernia. Patient is back in today for follow-up. Patient with CT scan which I reviewed personally. This shows a moderate size hiatal hernia recurrence. There appears to be herniated stomach within the posterior mediastinum.  Patient states she continues to have dysphagia. She stated wafer breath and possible. She does state that she can eat some certain meats without any dysphagia.  It appears dysphagia her main issue with minimal reflux on a PPI.   Allergies  No Known Drug Allergies [11/12/2015]: Allergies Reconciled   Medication History ProAir HFA (108 (90 Base)MCG/ACT Aerosol Soln, Inhalation) Active. Lisinopril (5MG  Tablet, Oral) Active. Klor-Con M20 (20MEQ Tablet ER, Oral) Active. Vitamin D (Ergocalciferol) (50000UNIT Capsule, Oral twice weekly) Active. Loratadine (10MG  Tablet, Oral) Active. Esomeprazole Magnesium (40MG  Capsule DR, Oral) Active. HydroCHLOROthiazide (25MG  Tablet, Oral) Active. Ibuprofen (800MG  Tablet, Oral) Active. Acetaminophen-Codeine #3 (300-30MG  Tablet, Oral) Active. Metoprolol Succinate ER (25MG  Tablet ER 24HR, Oral) Active. Pantoprazole Sodium (40MG  Tablet DR, Oral) Active. Citalopram Hydrobromide (20MG  Tablet, Oral) Active. Diclofenac Sodium (75MG  Tablet DR, Oral) Active. clonazePAM (0.25MG  Tablet Disint, Oral) Active. Pramipexole Dihydrochloride (0.25MG  Tablet, Oral) Active. Cyclobenzaprine HCl (10MG  Tablet, Oral) Active. Medications Reconciled    Review of Systems  General Not Present- Appetite Loss, Chills, Fatigue, Fever, Night Sweats, Weight Gain and Weight Loss. Skin Not Present- Change in Wart/Mole, Dryness, Hives, Jaundice, New Lesions, Non-Healing Wounds, Rash and Ulcer. HEENT Present- Wears glasses/contact lenses. Not Present- Earache, Hearing Loss, Hoarseness, Nose Bleed, Oral Ulcers, Ringing in the Ears, Seasonal  Allergies, Sinus Pain, Sore Throat, Visual Disturbances and Yellow Eyes. Breast Not Present- Breast Mass, Breast Pain, Nipple Discharge and Skin Changes. Cardiovascular Not Present- Chest Pain, Difficulty Breathing Lying Down, Leg Cramps, Palpitations, Rapid Heart Rate, Shortness of Breath and Swelling of Extremities. Gastrointestinal Not Present- Abdominal Pain, Bloating, Bloody Stool, Change in Bowel Habits, Chronic diarrhea, Constipation, Difficulty Swallowing, Excessive gas, Gets full quickly at meals, Hemorrhoids, Indigestion, Nausea, Rectal Pain and Vomiting. Musculoskeletal Not Present- Back Pain, Joint Pain, Joint Stiffness, Muscle Pain, Muscle Weakness and Swelling of Extremities. Neurological Not Present- Decreased Memory, Fainting, Headaches, Numbness, Seizures, Tingling, Tremor, Trouble walking and Weakness. Psychiatric Not Present- Anxiety, Bipolar, Change in Sleep Pattern, Depression, Fearful and Frequent crying. Endocrine Not Present- Cold Intolerance, Excessive Hunger, Hair Changes, Heat Intolerance and New Diabetes. Hematology Not Present- Blood Thinners, Easy Bruising, Excessive bleeding, Gland problems, HIV and Persistent Infections. All other systems negative    Physical Exam The physical exam findings are as follows: Note:Constitutional: No acute distress, conversant, appears stated age  Eyes: Anicteric sclerae, moist conjunctiva, no lid lag  Neck: No thyromegaly, trachea midline, no cervical lymphadenopathy  Lungs: Clear to auscultation biilaterally, normal respiratory effot  Cardiovascular: regular rate & rhythm, no murmurs, no peripheal edema, pedal pulses 2+  GI: Soft, no masses or hepatosplenomegaly, non-tender to palpation  MSK: Normal gait, no clubbing cyanosis, edema  Skin: No rashes, palpation reveals normal skin turgor  Psychiatric: Appropriate judgment and insight, oriented to person, place, and time    Assessment & Plan STATUS POST  NISSEN FUNDOPLICATION (Z98.890) HIATAL HERNIA (K44.9) Impression: Patient is a 48 year old female with a recurrent hiatal hernia. 1. Will proceed to the operating room for a robotic hiatal hernia repair with mesh and toupet fundoplication  2. Discussed with patient the risks and benefits of the procedure to include but not limited to: Infection, bleeding, damage to structures, possible pneumothorax, possible recurrence. The patient  voiced understanding and wishes to proceed.

## 2018-09-05 NOTE — Anesthesia Preprocedure Evaluation (Addendum)
Anesthesia Evaluation  Patient identified by MRN, date of birth, ID band Patient awake    Reviewed: Allergy & Precautions, Patient's Chart, lab work & pertinent test results, reviewed documented beta blocker date and time   History of Anesthesia Complications Negative for: history of anesthetic complications  Airway Mallampati: II  TM Distance: >3 FB Neck ROM: Full    Dental  (+) Teeth Intact, Dental Advisory Given   Pulmonary neg pulmonary ROS,    Pulmonary exam normal breath sounds clear to auscultation       Cardiovascular hypertension, Pt. on medications and Pt. on home beta blockers Normal cardiovascular exam Rhythm:Regular Rate:Normal  Hx of PSVT   Neuro/Psych Anxiety negative neurological ROS     GI/Hepatic Neg liver ROS, hiatal hernia, GERD  Medicated,  Endo/Other  Morbid obesity  Renal/GU negative Renal ROS     Musculoskeletal  (+) Arthritis ,   Abdominal   Peds  Hematology negative hematology ROS (+)   Anesthesia Other Findings Day of surgery medications reviewed with the patient.  Reproductive/Obstetrics                            Anesthesia Physical Anesthesia Plan  ASA: III  Anesthesia Plan: General   Post-op Pain Management:    Induction: Intravenous, Rapid sequence and Cricoid pressure planned  PONV Risk Score and Plan: 3 and Treatment may vary due to age or medical condition, Ondansetron, Dexamethasone, Midazolam and Scopolamine patch - Pre-op  Airway Management Planned: Oral ETT and Video Laryngoscope Planned  Additional Equipment: None  Intra-op Plan:   Post-operative Plan: Extubation in OR  Informed Consent: I have reviewed the patients History and Physical, chart, labs and discussed the procedure including the risks, benefits and alternatives for the proposed anesthesia with the patient or authorized representative who has indicated his/her understanding  and acceptance.     Dental advisory given  Plan Discussed with: CRNA  Anesthesia Plan Comments:        Anesthesia Quick Evaluation

## 2018-09-05 NOTE — Progress Notes (Signed)
Pt scheduled to have surgery on 09-06-18. Preliminary EKG not reviewed by Cardiologist. EKG lab contacted, and it was noted that they are currently experiencing a backlog. EKG reviewed with Jodell Cipro, PAC. Pt okay to proceed with surgery.

## 2018-09-06 ENCOUNTER — Ambulatory Visit (HOSPITAL_COMMUNITY): Payer: BLUE CROSS/BLUE SHIELD | Admitting: Anesthesiology

## 2018-09-06 ENCOUNTER — Inpatient Hospital Stay (HOSPITAL_COMMUNITY)
Admission: AD | Admit: 2018-09-06 | Discharge: 2018-09-08 | DRG: 328 | Disposition: A | Discharge status: Home / Self Care | Payer: BLUE CROSS/BLUE SHIELD | Attending: General Surgery | Admitting: General Surgery

## 2018-09-06 ENCOUNTER — Other Ambulatory Visit: Payer: Self-pay

## 2018-09-06 ENCOUNTER — Encounter (HOSPITAL_COMMUNITY): Payer: Self-pay | Admitting: *Deleted

## 2018-09-06 ENCOUNTER — Encounter (HOSPITAL_COMMUNITY)
Admission: AD | Disposition: A | Discharge status: Home / Self Care | Payer: Self-pay | Source: Home / Self Care | Attending: General Surgery

## 2018-09-06 DIAGNOSIS — R1310 Dysphagia, unspecified: Secondary | ICD-10-CM | POA: Diagnosis present

## 2018-09-06 DIAGNOSIS — Z79899 Other long term (current) drug therapy: Secondary | ICD-10-CM

## 2018-09-06 DIAGNOSIS — K449 Diaphragmatic hernia without obstruction or gangrene: Secondary | ICD-10-CM | POA: Diagnosis not present

## 2018-09-06 DIAGNOSIS — Z9889 Other specified postprocedural states: Secondary | ICD-10-CM | POA: Diagnosis present

## 2018-09-06 DIAGNOSIS — K224 Dyskinesia of esophagus: Secondary | ICD-10-CM | POA: Diagnosis not present

## 2018-09-06 DIAGNOSIS — I10 Essential (primary) hypertension: Secondary | ICD-10-CM | POA: Diagnosis not present

## 2018-09-06 HISTORY — PX: INSERTION OF MESH: SHX5868

## 2018-09-06 SURGERY — FUNDOPLICATION, NISSEN, ROBOT-ASSISTED, LAPAROSCOPIC
Anesthesia: General | Site: Abdomen

## 2018-09-06 MED ORDER — LIDOCAINE 2% (20 MG/ML) 5 ML SYRINGE
INTRAMUSCULAR | Status: DC | PRN
  Administered 2018-09-06: 1.5 mg/kg/h via INTRAVENOUS

## 2018-09-06 MED ORDER — SCOPOLAMINE 1 MG/3DAYS TD PT72
1.0000 | MEDICATED_PATCH | Freq: Once | TRANSDERMAL | Status: DC
  Administered 2018-09-06: 07:00:00 1.5 mg via TRANSDERMAL

## 2018-09-06 MED ORDER — ONDANSETRON HCL 4 MG/2ML IJ SOLN
INTRAMUSCULAR | Status: DC | PRN
  Administered 2018-09-06: 4 mg via INTRAVENOUS

## 2018-09-06 MED ORDER — METOPROLOL SUCCINATE ER 25 MG PO TB24
25.0000 mg | ORAL_TABLET | Freq: Every day | ORAL | Status: DC
  Administered 2018-09-06: 06:00:00 25 mg via ORAL
  Filled 2018-09-06: qty 1

## 2018-09-06 MED ORDER — CEFAZOLIN SODIUM-DEXTROSE 2-4 GM/100ML-% IV SOLN
2.0000 g | INTRAVENOUS | Status: AC
  Administered 2018-09-06: 08:00:00 2 g via INTRAVENOUS
  Filled 2018-09-06: qty 100

## 2018-09-06 MED ORDER — CHLORHEXIDINE GLUCONATE CLOTH 2 % EX PADS: 6.0000 | MEDICATED_PAD | Freq: Once | CUTANEOUS | Status: DC

## 2018-09-06 MED ORDER — HYDROMORPHONE HCL 1 MG/ML IJ SOLN
1.0000 mg | INTRAMUSCULAR | Status: DC | PRN
  Administered 2018-09-06: 1 mg via INTRAVENOUS
  Filled 2018-09-06: qty 1

## 2018-09-06 MED ORDER — LIDOCAINE 2% (20 MG/ML) 5 ML SYRINGE
INTRAMUSCULAR | Status: AC
  Filled 2018-09-06: qty 5

## 2018-09-06 MED ORDER — BUPIVACAINE-EPINEPHRINE (PF) 0.25% -1:200000 IJ SOLN
INTRAMUSCULAR | Status: AC
  Filled 2018-09-06: qty 30

## 2018-09-06 MED ORDER — DEXAMETHASONE SODIUM PHOSPHATE 10 MG/ML IJ SOLN
INTRAMUSCULAR | Status: DC | PRN
  Administered 2018-09-06: 10 mg via INTRAVENOUS

## 2018-09-06 MED ORDER — KETAMINE HCL 10 MG/ML IJ SOLN
INTRAMUSCULAR | Status: AC
  Filled 2018-09-06: qty 1

## 2018-09-06 MED ORDER — OXYCODONE HCL 5 MG PO TABS: 5.0000 mg | ORAL_TABLET | Freq: Once | ORAL | Status: DC | PRN

## 2018-09-06 MED ORDER — LACTATED RINGERS IV SOLN
INTRAVENOUS | Status: DC | PRN
  Administered 2018-09-06: 08:00:00 via INTRAVENOUS

## 2018-09-06 MED ORDER — CELECOXIB 200 MG PO CAPS
200.0000 mg | ORAL_CAPSULE | ORAL | Status: AC
  Administered 2018-09-06: 200 mg via ORAL
  Filled 2018-09-06: qty 1

## 2018-09-06 MED ORDER — SCOPOLAMINE 1 MG/3DAYS TD PT72
MEDICATED_PATCH | TRANSDERMAL | Status: AC
  Filled 2018-09-06: qty 1

## 2018-09-06 MED ORDER — ACETAMINOPHEN 500 MG PO TABS
1000.0000 mg | ORAL_TABLET | ORAL | Status: AC
  Administered 2018-09-06: 1000 mg via ORAL
  Filled 2018-09-06: qty 2

## 2018-09-06 MED ORDER — FENTANYL CITRATE (PF) 250 MCG/5ML IJ SOLN
INTRAMUSCULAR | Status: AC
  Filled 2018-09-06: qty 5

## 2018-09-06 MED ORDER — ENOXAPARIN SODIUM 40 MG/0.4ML ~~LOC~~ SOLN
40.0000 mg | SUBCUTANEOUS | Status: DC
  Administered 2018-09-06 – 2018-09-07 (×2): 40 mg via SUBCUTANEOUS
  Filled 2018-09-06 (×2): qty 0.4

## 2018-09-06 MED ORDER — ROCURONIUM BROMIDE 10 MG/ML (PF) SYRINGE
PREFILLED_SYRINGE | INTRAVENOUS | Status: AC
  Filled 2018-09-06: qty 10

## 2018-09-06 MED ORDER — PROPOFOL 10 MG/ML IV BOLUS
INTRAVENOUS | Status: DC | PRN
  Administered 2018-09-06: 180 mg via INTRAVENOUS

## 2018-09-06 MED ORDER — SUGAMMADEX SODIUM 200 MG/2ML IV SOLN
INTRAVENOUS | Status: DC | PRN
  Administered 2018-09-06: 400 mg via INTRAVENOUS

## 2018-09-06 MED ORDER — ONDANSETRON HCL 4 MG/2ML IJ SOLN: 4.0000 mg | Freq: Four times a day (QID) | INTRAMUSCULAR | Status: DC | PRN

## 2018-09-06 MED ORDER — FENTANYL CITRATE (PF) 100 MCG/2ML IJ SOLN: 25.0000 ug | INTRAMUSCULAR | Status: DC | PRN

## 2018-09-06 MED ORDER — FENTANYL CITRATE (PF) 250 MCG/5ML IJ SOLN
INTRAMUSCULAR | Status: DC | PRN
  Administered 2018-09-06: 50 ug via INTRAVENOUS
  Administered 2018-09-06: 100 ug via INTRAVENOUS
  Administered 2018-09-06 (×2): 50 ug via INTRAVENOUS

## 2018-09-06 MED ORDER — ONDANSETRON HCL 4 MG/2ML IJ SOLN
INTRAMUSCULAR | Status: AC
  Filled 2018-09-06: qty 2

## 2018-09-06 MED ORDER — 0.9 % SODIUM CHLORIDE (POUR BTL) OPTIME
TOPICAL | Status: DC | PRN
  Administered 2018-09-06: 1000 mL

## 2018-09-06 MED ORDER — PROPOFOL 10 MG/ML IV BOLUS
INTRAVENOUS | Status: AC
  Filled 2018-09-06: qty 20

## 2018-09-06 MED ORDER — SUCCINYLCHOLINE CHLORIDE 200 MG/10ML IV SOSY
PREFILLED_SYRINGE | INTRAVENOUS | Status: DC | PRN
  Administered 2018-09-06: 120 mg via INTRAVENOUS

## 2018-09-06 MED ORDER — KETAMINE HCL 10 MG/ML IJ SOLN
INTRAMUSCULAR | Status: DC | PRN
  Administered 2018-09-06: 30 mg via INTRAVENOUS

## 2018-09-06 MED ORDER — LACTATED RINGERS IR SOLN
Status: DC | PRN
  Administered 2018-09-06: 1000 mL

## 2018-09-06 MED ORDER — DEXTROSE-NACL 5-0.9 % IV SOLN
INTRAVENOUS | Status: DC
  Administered 2018-09-06 – 2018-09-08 (×4): via INTRAVENOUS

## 2018-09-06 MED ORDER — MIDAZOLAM HCL 2 MG/2ML IJ SOLN
INTRAMUSCULAR | Status: AC
  Filled 2018-09-06: qty 2

## 2018-09-06 MED ORDER — BUPIVACAINE-EPINEPHRINE 0.25% -1:200000 IJ SOLN
INTRAMUSCULAR | Status: DC | PRN
  Administered 2018-09-06: 17 mL

## 2018-09-06 MED ORDER — SUGAMMADEX SODIUM 500 MG/5ML IV SOLN
INTRAVENOUS | Status: AC
  Filled 2018-09-06: qty 5

## 2018-09-06 MED ORDER — PROMETHAZINE HCL 25 MG/ML IJ SOLN: 6.2500 mg | INTRAMUSCULAR | Status: DC | PRN

## 2018-09-06 MED ORDER — DEXAMETHASONE SODIUM PHOSPHATE 10 MG/ML IJ SOLN
INTRAMUSCULAR | Status: AC
  Filled 2018-09-06: qty 1

## 2018-09-06 MED ORDER — LACTATED RINGERS IV SOLN
INTRAVENOUS | Status: DC
  Administered 2018-09-06 (×2): via INTRAVENOUS

## 2018-09-06 MED ORDER — OXYCODONE HCL 5 MG/5ML PO SOLN: 5.0000 mg | Freq: Once | ORAL | Status: DC | PRN

## 2018-09-06 MED ORDER — ONDANSETRON 4 MG PO TBDP: 4.0000 mg | ORAL_TABLET | Freq: Four times a day (QID) | ORAL | Status: DC | PRN

## 2018-09-06 MED ORDER — GABAPENTIN 300 MG PO CAPS
300.0000 mg | ORAL_CAPSULE | ORAL | Status: AC
  Administered 2018-09-06: 06:00:00 300 mg via ORAL
  Filled 2018-09-06: qty 1

## 2018-09-06 MED ORDER — METOPROLOL TARTRATE 5 MG/5ML IV SOLN
5.0000 mg | Freq: Two times a day (BID) | INTRAVENOUS | Status: DC
  Administered 2018-09-06 – 2018-09-07 (×3): 5 mg via INTRAVENOUS
  Filled 2018-09-06 (×3): qty 5

## 2018-09-06 MED ORDER — MIDAZOLAM HCL 5 MG/5ML IJ SOLN
INTRAMUSCULAR | Status: DC | PRN
  Administered 2018-09-06: 2 mg via INTRAVENOUS

## 2018-09-06 MED ORDER — LIDOCAINE HCL 2 % IJ SOLN
INTRAMUSCULAR | Status: AC
  Filled 2018-09-06: qty 20

## 2018-09-06 MED ORDER — ROCURONIUM BROMIDE 10 MG/ML (PF) SYRINGE
PREFILLED_SYRINGE | INTRAVENOUS | Status: DC | PRN
  Administered 2018-09-06: 60 mg via INTRAVENOUS
  Administered 2018-09-06: 40 mg via INTRAVENOUS
  Administered 2018-09-06: 20 mg via INTRAVENOUS

## 2018-09-06 MED ORDER — SUCCINYLCHOLINE CHLORIDE 200 MG/10ML IV SOSY
PREFILLED_SYRINGE | INTRAVENOUS | Status: AC
  Filled 2018-09-06: qty 10

## 2018-09-06 SURGICAL SUPPLY — 58 items
APPLIER CLIP 5 13 M/L LIGAMAX5 (MISCELLANEOUS)
APPLIER CLIP ROT 10 11.4 M/L (STAPLE)
BLADE SURG SZ11 CARB STEEL (BLADE) ×3 IMPLANT
CHLORAPREP W/TINT 26 (MISCELLANEOUS) ×3 IMPLANT
CLIP APPLIE 5 13 M/L LIGAMAX5 (MISCELLANEOUS) IMPLANT
CLIP APPLIE ROT 10 11.4 M/L (STAPLE) IMPLANT
CLIP VESOLOCK LG 6/CT PURPLE (CLIP) IMPLANT
CLIP VESOLOCK MED LG 6/CT (CLIP) IMPLANT
COVER SURGICAL LIGHT HANDLE (MISCELLANEOUS) ×3 IMPLANT
COVER TIP SHEARS 8 DVNC (MISCELLANEOUS) IMPLANT
COVER TIP SHEARS 8MM DA VINCI (MISCELLANEOUS)
COVER WAND RF STERILE (DRAPES) ×3 IMPLANT
DECANTER SPIKE VIAL GLASS SM (MISCELLANEOUS) ×3 IMPLANT
DERMABOND ADVANCED (GAUZE/BANDAGES/DRESSINGS) ×1
DERMABOND ADVANCED .7 DNX12 (GAUZE/BANDAGES/DRESSINGS) ×2 IMPLANT
DRAIN PENROSE 18X1/2 LTX STRL (DRAIN) ×3 IMPLANT
DRAPE ARM DVNC X/XI (DISPOSABLE) ×8 IMPLANT
DRAPE COLUMN DVNC XI (DISPOSABLE) ×2 IMPLANT
DRAPE DA VINCI XI ARM (DISPOSABLE) ×4
DRAPE DA VINCI XI COLUMN (DISPOSABLE) ×1
ELECT REM PT RETURN 15FT ADLT (MISCELLANEOUS) ×3 IMPLANT
ENDOLOOP SUT PDS II  0 18 (SUTURE)
ENDOLOOP SUT PDS II 0 18 (SUTURE) IMPLANT
GAUZE 4X4 16PLY RFD (DISPOSABLE) ×3 IMPLANT
GLOVE BIO SURGEON STRL SZ7.5 (GLOVE) ×6 IMPLANT
GOWN STRL REUS W/TWL XL LVL3 (GOWN DISPOSABLE) ×12 IMPLANT
KIT BASIN OR (CUSTOM PROCEDURE TRAY) ×3 IMPLANT
KIT TURNOVER KIT A (KITS) IMPLANT
MARKER SKIN DUAL TIP RULER LAB (MISCELLANEOUS) ×3 IMPLANT
MESH BIO-A 7X10 SYN MAT (Mesh General) ×2 IMPLANT
NDL INSUFFLATION 14GA 120MM (NEEDLE) ×1 IMPLANT
NEEDLE HYPO 22GX1.5 SAFETY (NEEDLE) ×3 IMPLANT
NEEDLE INSUFFLATION 14GA 120MM (NEEDLE) ×3 IMPLANT
OBTURATOR OPTICAL STANDARD 8MM (TROCAR)
OBTURATOR OPTICAL STND 8 DVNC (TROCAR)
OBTURATOR OPTICALSTD 8 DVNC (TROCAR) IMPLANT
PACK CARDIOVASCULAR III (CUSTOM PROCEDURE TRAY) ×3 IMPLANT
PAD POSITIONING PINK XL (MISCELLANEOUS) ×3 IMPLANT
SCISSORS LAP 5X35 DISP (ENDOMECHANICALS) IMPLANT
SEAL CANN UNIV 5-8 DVNC XI (MISCELLANEOUS) ×8 IMPLANT
SEAL XI 5MM-8MM UNIVERSAL (MISCELLANEOUS) ×4
SEALER VESSEL DA VINCI XI (MISCELLANEOUS) ×1
SEALER VESSEL EXT DVNC XI (MISCELLANEOUS) ×2 IMPLANT
SET IRRIG TUBING LAPAROSCOPIC (IRRIGATION / IRRIGATOR) ×3 IMPLANT
SOLUTION ANTI FOG 6CC (MISCELLANEOUS) ×3 IMPLANT
SOLUTION ELECTROLUBE (MISCELLANEOUS) ×3 IMPLANT
SUCTION FRAZIER HANDLE 12FR (TUBING) ×1
SUCTION TUBE FRAZIER 12FR DISP (TUBING) ×1 IMPLANT
SUT ETHIBOND 0 36 GRN (SUTURE) ×6 IMPLANT
SUT MNCRL AB 4-0 PS2 18 (SUTURE) ×3 IMPLANT
SUT SILK 0 SH 30 (SUTURE) ×3 IMPLANT
SUT SILK 2 0 SH (SUTURE) ×6 IMPLANT
SYR 20CC LL (SYRINGE) ×3 IMPLANT
TOWEL OR 17X26 10 PK STRL BLUE (TOWEL DISPOSABLE) ×3 IMPLANT
TOWEL OR NON WOVEN STRL DISP B (DISPOSABLE) ×3 IMPLANT
TRAY FOLEY MTR SLVR 14FR STAT (SET/KITS/TRAYS/PACK) ×2 IMPLANT
TROCAR ADV FIXATION 5X100MM (TROCAR) ×3 IMPLANT
TUBING INSUFFLATION 10FT LAP (TUBING) ×3 IMPLANT

## 2018-09-06 NOTE — Transfer of Care (Signed)
Immediate Anesthesia Transfer of Care Note  Patient: Gerldine Crayne Orlich  Procedure(s) Performed: XI ROBOTIC HIATAL REDO HERNIA REPAIR WITH MESH AND FUNDOPLICATION (N/A Abdomen) INSERTION OF MESH (Abdomen)  Patient Location: PACU  Anesthesia Type:General  Level of Consciousness: awake, alert , oriented and patient cooperative  Airway & Oxygen Therapy: Patient Spontanous Breathing and Patient connected to face mask oxygen  Post-op Assessment: Report given to RN, Post -op Vital signs reviewed and stable and Patient moving all extremities  Post vital signs: Reviewed and stable  Last Vitals:  Vitals Value Taken Time  BP 134/80 09/06/2018 11:01 AM  Temp    Pulse 96 09/06/2018 11:02 AM  Resp 15 09/06/2018 11:02 AM  SpO2 100 % 09/06/2018 11:02 AM  Vitals shown include unvalidated device data.  Last Pain:  Vitals:   09/06/18 0530  TempSrc: Oral      Patients Stated Pain Goal: 3 (09/06/18 0551)  Complications: No apparent anesthesia complications

## 2018-09-06 NOTE — Addendum Note (Signed)
Addendum  created 09/06/18 1242 by Lorelee Market, CRNA   Intraprocedure LDAs edited, LDA properties accepted

## 2018-09-06 NOTE — Progress Notes (Signed)
NUTRITION NOTE RD working remotely.   Consult received for patient to be on FLD x2 weeks. Patient is POD #0 hiatal hernia repair with mesh and toupet fundoplication.   Will place diet education handouts in Discharge Instructions.     Trenton Gammon, MS, RD, LDN, [facility] Inpatient Clinical Dietitian Pager # [phone] After hours/weekend pager # [phone]

## 2018-09-06 NOTE — Anesthesia Procedure Notes (Signed)
Procedure Name: Intubation Date/Time: 09/06/2018 7:56 AM Performed by: Lorelee Market, CRNA Pre-anesthesia Checklist: Patient identified, Emergency Drugs available, Suction available, Patient being monitored and Timeout performed Patient Re-evaluated:Patient Re-evaluated prior to induction Oxygen Delivery Method: Circle system utilized Preoxygenation: Pre-oxygenation with 100% oxygen Induction Type: IV induction and Rapid sequence Ventilation: Mask ventilation without difficulty Laryngoscope Size: Glidescope and 3 Grade View: Grade I Tube type: Oral Tube size: 7.0 mm Number of attempts: 1 Airway Equipment and Method: Video-laryngoscopy Placement Confirmation: ETT inserted through vocal cords under direct vision,  positive ETCO2 and breath sounds checked- equal and bilateral Secured at: 24 cm Tube secured with: Tape Dental Injury: Teeth and Oropharynx as per pre-operative assessment

## 2018-09-06 NOTE — Anesthesia Postprocedure Evaluation (Signed)
Anesthesia Post Note  Patient: Rayvin Julio Callaham  Procedure(s) Performed: XI ROBOTIC HIATAL REDO HERNIA REPAIR WITH MESH AND FUNDOPLICATION (N/A Abdomen) INSERTION OF MESH (Abdomen)     Patient location during evaluation: PACU Anesthesia Type: General Level of consciousness: awake and alert Pain management: pain level controlled Vital Signs Assessment: post-procedure vital signs reviewed and stable Respiratory status: spontaneous breathing, nonlabored ventilation and respiratory function stable Cardiovascular status: blood pressure returned to baseline and stable Postop Assessment: no apparent nausea or vomiting Anesthetic complications: no    Last Vitals:  Vitals:   09/06/18 1130 09/06/18 1147  BP: (!) 153/87 116/77  Pulse: 95 95  Resp: 16   Temp: 36.8 C 37 C  SpO2: 93% 96%    Last Pain:  Vitals:   09/06/18 1147  TempSrc: Oral  PainSc:                  Kaylyn Layer

## 2018-09-06 NOTE — Interval H&P Note (Signed)
History and Physical Interval Note:  09/06/2018 7:08 AM  Darlene [name]  has presented today for surgery, with the diagnosis of recurrent hiatel hernia.  The various methods of treatment have been discussed with the patient and family. After consideration of risks, benefits and other options for treatment, the patient has consented to  Procedure(s): XI ROBOTIC HIATAL HERNIA REPAIR WITH MESH AND FUNDOPLICATION (N/A) as a surgical intervention.  The patient's history has been reviewed, patient examined, no change in status, stable for surgery.  I have reviewed the patient's chart and labs.  Questions were answered to the patient's satisfaction.     Axel Filler

## 2018-09-06 NOTE — Op Note (Signed)
09/06/2018  10:45 AM  PATIENT:  Darlene [name]  48 y.o. female  PRE-OPERATIVE DIAGNOSIS:  recurrent hiatal hernia  POST-OPERATIVE DIAGNOSIS:  recurrent hiatal hernia  PROCEDURE:  Procedure(s): XI ROBOTIC HIATAL REDO HERNIA REPAIR WITH MESH AND TOUPET FUNDOPLICATION (N/A) INSERTION OF BIO-A MESH  SURGEON:  Surgeon(s) and Role:    * Axel Filleramirez, Leighanne Adolph, MD - Primary    Karie Soda* Gross, Steven, MD - Assisting  ANESTHESIA:   local and general  EBL:  25cc   BLOOD ADMINISTERED:none  DRAINS: none   LOCAL MEDICATIONS USED:  BUPIVICAINE   SPECIMEN:  No Specimen  DISPOSITION OF SPECIMEN:  N/A  COUNTS:  YES  TOURNIQUET:  * No tourniquets in log *  DICTATION: .Dragon Dictation Findings: Pt with recurrent hiatal hernia with wrap and stomach in chest cavity.  Dense adhesions to the wrap and wrap was intact.  It appeared to have cork-screwed which could have been contributing to her problem.  The patient was taken back to the operating room and placed in the supine position with bilateral SCDs in place. The patient was prepped and draped in the usual sterile fashion. After appropriate antibiotics were confirmed a timeout was called and all facts were verified.   A Veress needle technique was used to insufflate the abdomen to 15 mm of mercury the paramedian stab incision. Subsequent to this an 8 mm trocar was introduced as was a 8 millimeter camera. At this time the subsequent robotic trochars x3, were then placed adjacent to this trocar approximately 8-10 cm away. Each trocar was inserted under direct visualization, there were total of 4 trochars. The assistant trocar was then placed in the right lower quadrant under direct visualization. The Nathanson retractor was then visualized inserted into the abdomen and the incision just to the left of the falciform ligament. This was then placed to retract the liver appropriately. At this time the patient was positioned in reverse Trendelenburg.   At this  time the robot patient cart was brought to the bedside and placed in good position and the arms were docked to the trochars appropriately. There was some omental adhesions to the hiatus and the liver. At this time I proceeded to incised the gastrohepatic ligament and adhesions sharply and with cautery to help with hemostasis.  At this time I proceeded to mobilize the stomach inferiorly and visualize the right crus. The peritoneum over the right crus was incised and right crus was identified. I proceeded to dissect this anteriorly until the left crus was seen.   The anterior crus was densely adherent to the previously places mesh.  Once the right crus was adequately dissected we turned our to the left crus which was dissected away. This required traction of the stomach to the right side. Once this was visualized we then proceeded to circumferentially dissect the esophagus away from the surrounding tissue. We continued the dissection up to the mediastinum and with inferior traction, the sac could be seen in the chest.  This was brought down and the wrap was released.  There was a moderate-sized hiatal hernia seen. I mobilized the esophagus cephalad approximately 4-5 cm, clearing away the surrounding tissue. The anterior hernia sac was dissected away from the stomach and esophagus.  At this time I proceeded to close the hiatus using figure of 8 0 Ethibonds x 3. This brought together the hiatal closure without undue stricture to the esophagus.   A piece of Gore Bio A hiatal mesh was placed over the hiatal closure  and sutured to the crus using 0 silk sutures x 4.  At this time the greater curvature was brought around the esophagus and sutured using 0 silk sutures interrupted fashion approximately 1 cm apart x3 on each the left and right side in a Toupet fashion.  A left collar stitch was then used to gastropexy the stomach from the wrap to the diaphragm just lateral to the left crus as.  A second collar stitch  was placed from the wrap to the right crus. The wrap lay at approximately 11:00 on its own with undue tension.  The wrap lay loose with no strangulation of the esophagus.  At this time the robot was undocked. The liver trocar was removed. At this time insufflation was evacuated. Skin was reapproximated for Monocryl subcuticular fashion. The skin was then dressed with Dermabond. The patient tolerated the procedure well and was taken to the recovery room in stable condition.    PLAN OF CARE: Admit for overnight observation  PATIENT DISPOSITION:  PACU - hemodynamically stable.   Delay start of Pharmacological VTE agent (>24hrs) due to surgical blood loss or risk of bleeding: yes

## 2018-09-06 NOTE — Discharge Instructions (Signed)
EATING AFTER YOUR ESOPHAGEAL SURGERY (Stomach Fundoplication, Hiatal Hernia repair, Achalasia surgery, etc)  ######################################################################  EAT Start with a pureed / full liquid diet (see below) Gradually transition to a high fiber diet with a fiber supplement over the next month after discharge.    WALK Walk an hour a day.  Control your pain to do that.    CONTROL PAIN  Control pain so that you can walk, sleep, tolerate sneezing/coughing, go up/down stairs.  HAVE A BOWEL MOVEMENT DAILY Keep your bowels regular to avoid problems.  OK to try a laxative to override constipation.  OK to use an antidairrheal to slow down diarrhea.  Call if not better after 2 tries  CALL IF YOU HAVE PROBLEMS/CONCERNS Call if you are still struggling despite following these instructions. Call if you have concerns not answered by these instructions  ######################################################################   After your esophageal surgery, expect some sticking with swallowing over the next 1-2 months.    If food sticks when you eat, it is called "dysphagia".  This is due to swelling around your esophagus at the wrap & hiatal diaphragm repair.  It will gradually ease off over the next few months.  To help you through this temporary phase, we start you out on a pureed (blenderized) diet.  Your first meal in the hospital was thin liquids.  You should have been given a pureed diet by the time you left the hospital.  We ask patients to stay on a pureed diet for the first 2-3 weeks to avoid anything getting "stuck" near your recent surgery.  Don't be alarmed if your ability to swallow doesn't progress according to this plan.  Everyone is different and some diets can advance more or less quickly.     Some BASIC RULES to follow are:  Maintain an upright position whenever eating or drinking.  Take small bites - just a teaspoon size bite at a time.  Eat  slowly.  It may also help to eat only one food at a time.  Consider nibbling through smaller, more frequent meals & avoid the urge to eat BIG meals  Do not push through feelings of fullness, nausea, or bloatedness  Do not mix solid foods and liquids in the same mouthful  Try not to "wash foods down" with large gulps of liquids.  Avoid carbonated (bubbly/fizzy) drinks.    Avoid foods that make you feel gassy or bloated.  Start with bland foods first.  Wait on trying greasy, fried, or spicy meals until you are tolerating more bland solids well.  Understand that it will be hard to burp and belch at first.  This gradually improves with time.  Expect to be more gassy/flatulent/bloated initially.  Walking will help your body manage it better.  Consider using medications for bloating that contain simethicone such as  Maalox or Gas-X   Eat in a relaxed atmosphere & minimize distractions.  Avoid talking while eating.    Do not use straws.  Following each meal, sit in an upright position (90 degree angle) for 60 to 90 minutes.  Going for a short walk can help as well  If food does stick, don't panic.  Try to relax and let the food pass on its own.  Sipping WARM LIQUID such as strong hot black tea can also help slide it down.   Be gradual in changes & use common sense:  -If you easily tolerating a certain "level" of foods, advance to the next level gradually -If you  having trouble swallowing a particular food, then avoid it.   °-If food is sticking when you advance your diet, go back to thinner previous diet (the lower LEVEL) for 1-2 days. ° °LEVEL 1 = PUREED DIET ° °Do for the first 2 WEEKS AFTER SURGERY ° °-Foods in this group are pureed or blenderized to a smooth, mashed potato-like consistency.  °-If necessary, the pureed foods can keep their shape with the addition of a thickening agent.   °-Meat should be pureed to a smooth, pasty consistency.  Hot broth or gravy may be added to the pureed  meat, approximately 1 oz. of liquid per 3 oz. serving of meat. °-CAUTION:  If any foods do not puree into a smooth consistency, swallowing will be more difficult.  (For example, nuts or seeds sometimes do not blend well.) ° °Hot Foods Cold Foods  °Pureed scrambled eggs and cheese Pureed cottage cheese  °Baby cereals Thickened juices and nectars  °Thinned cooked cereals (no lumps) Thickened milk or eggnog  °Pureed French toast or pancakes Ensure  °Mashed potatoes Ice cream  °Pureed parsley, au gratin, scalloped potatoes, candied sweet potatoes Fruit or Italian ice, sherbet  °Pureed buttered or alfredo noodles Plain yogurt  °Pureed vegetables (no corn or peas) Instant breakfast  °Pureed soups and creamed soups Smooth pudding, mousse, custard  °Pureed scalloped apples Whipped gelatin  °Gravies Sugar, syrup, honey, jelly  °Sauces, cheese, tomato, barbecue, white, creamed Cream  °Any baby food Creamer  °Alcohol in moderation (not beer or champagne) Margarine  °Coffee or tea Mayonnaise  ° Ketchup, mustard  ° Apple sauce  ° °SAMPLE MENU:  PUREED DIET °Breakfast Lunch Dinner  °· Orange juice, 1/2 cup °· Cream of wheat, 1/2 cup · Pineapple juice, 1/2 cup · Pureed turkey, barley soup, 3/4 cup °· Pureed Hawaiian chicken, 3 oz  °· Scrambled eggs, mashed or blended with cheese, 1/2 cup °· Tea or coffee, 1 cup  °· Whole milk, 1 cup  °· Non-dairy creamer, 2 Tbsp. · Mashed potatoes, 1/2 cup °· Pureed cooled broccoli, 1/2 cup °· Apple sauce, 1/2 cup °· Coffee or tea · Mashed potatoes, 1/2 cup °· Pureed spinach, 1/2 cup °· Frozen yogurt, 1/2 cup °· Tea or coffee  ° ° ° ° °LEVEL 2 = SOFT DIET ° °After your first 2 weeks, you can advance to a soft diet.   °Keep on this diet until everything goes down easily. ° °Hot Foods Cold Foods  °White fish Cottage cheese  °Stuffed fish Junior baby fruit  °Baby food meals Semi thickened juices  °Minced soft cooked, scrambled, poached eggs nectars  °Souffle & omelets Ripe mashed bananas  °Cooked  cereals Canned fruit, pineapple sauce, milk  °potatoes Milkshake  °Buttered or Alfredo noodles Custard  °Cooked cooled vegetable Puddings, including tapioca  °Sherbet Yogurt  °Vegetable soup or alphabet soup Fruit ice, Italian ice  °Gravies Whipped gelatin  °Sugar, syrup, honey, jelly Junior baby desserts  °Sauces:  Cheese, creamed, barbecue, tomato, white Cream  °Coffee or tea Margarine  ° °SAMPLE MENU:  LEVEL 2 °Breakfast Lunch Dinner  °· Orange juice, 1/2 cup °· Oatmeal, 1/2 cup °· Scrambled eggs with cheese, 1/2 cup °· Decaffeinated tea, 1 cup °· Whole milk, 1 cup °· Non-dairy creamer, 2 Tbsp · Pineapple juice, 1/2 cup °· Minced beef, 3 oz °· Gravy, 2 Tbsp °· Mashed potatoes, 1/2 cup °· Minced fresh broccoli, 1/2 cup °· Applesauce, 1/2 cup °· Coffee, 1 cup · Turkey, barley soup, 3/4 cup °·   Minced Hawaiian chicken, 3 oz °· Mashed potatoes, 1/2 cup °· Cooked spinach, 1/2 cup °· Frozen yogurt, 1/2 cup °· Non-dairy creamer, 2 Tbsp  ° ° ° ° °LEVEL 3 = CHOPPED DIET ° °-After all the foods in level 2 (soft diet) are passing through well you should advance up to more chopped foods.  °-It is still important to cut these foods into small pieces and eat slowly. ° °Hot Foods Cold Foods  °Poultry Cottage cheese  °Chopped Swedish meatballs Yogurt  °Meat salads (ground or flaked meat) Milk  °Flaked fish (tuna) Milkshakes  °Poached or scrambled eggs Soft, cold, dry cereal  °Souffles and omelets Fruit juices or nectars  °Cooked cereals Chopped canned fruit  °Chopped French toast or pancakes Canned fruit cocktail  °Noodles or pasta (no rice) Pudding, mousse, custard  °Cooked vegetables (no frozen peas, corn, or mixed vegetables) Green salad  °Canned small sweet peas Ice cream  °Creamed soup or vegetable soup Fruit ice, Italian ice  °Pureed vegetable soup or alphabet soup Non-dairy creamer  °Ground scalloped apples Margarine  °Gravies Mayonnaise  °Sauces:  Cheese, creamed, barbecue, tomato, white Ketchup  °Coffee or tea Mustard   ° °SAMPLE MENU:  LEVEL 3 °Breakfast Lunch Dinner  °· Orange juice, 1/2 cup °· Oatmeal, 1/2 cup °· Scrambled eggs with cheese, 1/2 cup °· Decaffeinated tea, 1 cup °· Whole milk, 1 cup °· Non-dairy creamer, 2 Tbsp °· Ketchup, 1 Tbsp °· Margarine, 1 tsp °· Salt, 1/4 tsp °· Sugar, 2 tsp · Pineapple juice, 1/2 cup °· Ground beef, 3 oz °· Gravy, 2 Tbsp °· Mashed potatoes, 1/2 cup °· Cooked spinach, 1/2 cup °· Applesauce, 1/2 cup °· Decaffeinated coffee °· Whole milk °· Non-dairy creamer, 2 Tbsp °· Margarine, 1 tsp °· Salt, 1/4 tsp · Pureed turkey, barley soup, 3/4 cup °· Barbecue chicken, 3 oz °· Mashed potatoes, 1/2 cup °· Ground fresh broccoli, 1/2 cup °· Frozen yogurt, 1/2 cup °· Decaffeinated tea, 1 cup °· Non-dairy creamer, 2 Tbsp °· Margarine, 1 tsp °· Salt, 1/4 tsp °· Sugar, 1 tsp  ° ° °LEVEL 4:  REGULAR FOODS ° °-Foods in this group are soft, moist, regularly textured foods.   °-This level includes meat and breads, which tend to be the hardest things to swallow.   °-Eat very slowly, chew well and continue to avoid carbonated drinks. °-most people are at this level in 4-6 weeks ° °Hot Foods Cold Foods  °Baked fish or skinned Soft cheeses - cottage cheese  °Souffles and omelets Cream cheese  °Eggs Yogurt  °Stuffed shells Milk  °Spaghetti with meat sauce Milkshakes  °Cooked cereal Cold dry cereals (no nuts, dried fruit, coconut)  °French toast or pancakes Crackers  °Buttered toast Fruit juices or nectars  °Noodles or pasta (no rice) Canned fruit  °Potatoes (all types) Ripe bananas  °Soft, cooked vegetables (no corn, lima, or baked beans) Peeled, ripe, fresh fruit  °Creamed soups or vegetable soup Cakes (no nuts, dried fruit, coconut)  °Canned chicken noodle soup Plain doughnuts  °Gravies Ice cream  °Bacon dressing Pudding, mousse, custard  °Sauces:  Cheese, creamed, barbecue, tomato, white Fruit ice, Italian ice, sherbet  °Decaffeinated tea or coffee Whipped gelatin  °Pork chops Regular gelatin  ° Canned fruited  gelatin molds  ° Sugar, syrup, honey, jam, jelly  ° Cream  ° Non-dairy  ° Margarine  ° Oil  ° Mayonnaise  ° Ketchup  ° Mustard  ° °TROUBLESHOOTING IRREGULAR BOWELS  °1) Avoid extremes of bowel   of bowel movements (no bad constipation/diarrhea)  2) Miralax 17gm mixed in 8oz. water or juice-daily. May use BID as needed.  3) Gas-x,Phazyme, etc. as needed for gas & bloating.  4) Soft,bland diet. No spicy,greasy,fried foods.  5) Prilosec over-the-counter as needed  6) May hold gluten/wheat products from diet to see if symptoms improve.  7) May try probiotics (Align, Activa, etc) to help calm the bowels down  7) If symptoms become worse call back immediately.    If you have any questions please call our office at [facility]: [phone].      Full Liquid Nutrition Therapy The full liquid diet includes mostly liquids (including milk) and some foods with small amounts of fiber. The full liquid diet can provide many of the nutrients your body needs, but it may not give enough vitamins, minerals, and fiber. A fluid is anything that is liquid or anything that would melt if left at room temperature.  You will need to count these foods and liquids--including any liquid used to take medication--as part of your daily fluid intake.   Some examples are: Coffee, tea, and other hot beverages Gelatin Gravy Ice cream, sherbet, sorbet Ice cubes, ice chips Milk, liquid creamer Nutritional supplements Popsicles Vegetable and fruit juices; fluid in canned fruit (fruit itself is not allowed) Yogurt (without nuts, seeds, or fruit) Soft drinks, lemonade, limeade Soups (strained) Syrup  **This diet should only be used temporarily during your recovery until it is safe for you to eat regular foods.   Tips Determine the which foods/fluids from the list are most appealing to you. Eat or drink your favorite flavors to help you better enjoy this diet. Include milk-based or dairy-type fluids and  juices.  If you are lactose intolerant, choose nut and seed milks. Eat 3 full liquid meals throughout the day and include a snack time between each meal. Drink nutritional shakes in 6-8 ounce servings as part of a meal or between meals to make sure youre taking in enough calories.  You can make fortified shakes for yourself or buy them premade at a store.  Foods Recommended Recommended Foods Grains Thin hot cereal, such as cream of wheat Dairy Milk: Nonfat, 1%, 2%, whole Soy milk, almond milk, rice m ilk, coconut milk, cashew milk Milkshakes Yogurt Custard Pudding Vegetables Vegetable juice with or without pulp Thin, pureed vegetable soups Fruits Translucent fruit juices without pulp (apple, cranberry, grape) Oils Almond, avocado, canola, cashew, corn, grapeseed, olive, safflower, sesame, soybean, sunflower Butter (melted) Margarine (melted) that does not contain trans fat (read the product label) Other Flavored gelatin (any flavor) Strained cream soups Chicken, beef, or vege table broths Popsicle Beverages Water Ice Sod a Tea Coffee Nutritional supplements or shakes *A high-protein shake should contain at least 8-10 grams of protein per serving.  Read product labels to find a shake that is high in protein.  If you are preparing the shake at home, you can increase the amount of protein by adding protein powder, non-fat dry milk powder, yogurt, or low-fat milk.   Foods Not Recommended Grains All grain foods including whole grains, processed grains such as pasta, rice, cold cereals, bread, snacks, and sweets that are flour-based (cakes, cookies) Protein Foods Beef and pork (all cuts) Chicken and [place] (all cuts) Fish (all types) Nuts and nut b utters (all types) Eggs (all types) All meat substitutes (such as soy and tofu) All old cuts or lunch meat (such as salami, ham) Sausage (all types) Dairy Hard cheese Yogurt  with f ruit chunks Vegetables Whole, frozen,  fresh, canned varieties Fruits Whole, frozen, fresh, canned varieties Oils Butter Coconut oil Palm Oil Lard  Full Liquid Nutrition Therapy Sample 1-Day Menu Breakfast 1/2 cup orange juice (without pulp) 1 cup cream of wheat 1 cup skim milk 6 ounces nonfat yogurt (without nuts, seeds, or fruit) 8 ounces coffee Lunch 1 cup apple juice 1 cup tomato soup 1/2 cup chocolate pudding 1 cup high protein chocolate shake 8 ounces tea Evening Meal 1/2 cup grape juice 1 cup skim milk 1 cup high-protein vanilla shake 1 cup strained, blended cream of broccoli soup 1/4 cup custard Evening Snack 1 cup high-protein strawberry shake (without seeds)

## 2018-09-07 ENCOUNTER — Observation Stay (HOSPITAL_COMMUNITY): Payer: BLUE CROSS/BLUE SHIELD

## 2018-09-07 DIAGNOSIS — K224 Dyskinesia of esophagus: Secondary | ICD-10-CM | POA: Diagnosis not present

## 2018-09-07 DIAGNOSIS — R1310 Dysphagia, unspecified: Secondary | ICD-10-CM | POA: Diagnosis present

## 2018-09-07 DIAGNOSIS — Z79899 Other long term (current) drug therapy: Secondary | ICD-10-CM | POA: Diagnosis not present

## 2018-09-07 DIAGNOSIS — K449 Diaphragmatic hernia without obstruction or gangrene: Secondary | ICD-10-CM | POA: Diagnosis present

## 2018-09-07 LAB — BASIC METABOLIC PANEL
Anion gap: 7 (ref 5–15)
BUN: 14 mg/dL (ref 6–20)
CO2: 24 mmol/L (ref 22–32)
Calcium: 8.2 mg/dL — ABNORMAL LOW (ref 8.9–10.3)
Chloride: 108 mmol/L (ref 98–111)
Creatinine, Ser: 0.58 mg/dL (ref 0.44–1.00)
GFR calc Af Amer: >60 mL/min (ref >60)
GFR calc non Af Amer: >60 mL/min (ref >60)
Glucose, Bld: 141 mg/dL — ABNORMAL HIGH (ref 70–99)
Potassium: 2.9 mmol/L — ABNORMAL LOW (ref 3.5–5.1)
Sodium: 139 mmol/L (ref 135–145)

## 2018-09-07 LAB — HIV ANTIBODY (ROUTINE TESTING W REFLEX): HIV Screen 4th Generation wRfx: NONREACTIVE

## 2018-09-07 MED ORDER — DIATRIZOATE MEGLUMINE & SODIUM 66-10 % PO SOLN
ORAL | Status: AC
  Filled 2018-09-07: qty 30

## 2018-09-07 NOTE — Progress Notes (Signed)
1 Day Post-Op   Subjective/Chief Complaint: Feels well, almost no pain   Objective: Vital signs in last 24 hours: Temp:  [98 F (36.7 C)-99.1 F (37.3 C)] 98.4 F (36.9 C) (04/25 0905) Pulse Rate:  [61-99] 61 (04/25 0905) Resp:  [16-21] 16 (04/25 0905) BP: (100-153)/(68-90) 100/68 (04/25 0905) SpO2:  [93 %-100 %] 99 % (04/25 0905) Last BM Date: 09/04/18  Intake/Output from previous day: 04/24 0701 - 04/25 0700 In: 3694 [I.V.:3594; IV Piggyback:100] Out: 875 [Urine:825; Blood:50] Intake/Output this shift: Total I/O In: 217.6 [I.V.:217.6] Out: -   Exam: Awake and alert Abdomen soft, almost non-tender  Lab Results:  No results for input(s): WBC, HGB, HCT, PLT in the last 72 hours. BMET Recent Labs    09/07/18 0346  NA 139  K 2.9*  CL 108  CO2 24  GLUCOSE 141*  BUN 14  CREATININE 0.58  CALCIUM 8.2*   PT/INR No results for input(s): LABPROT, INR in the last 72 hours. ABG No results for input(s): PHART, HCO3 in the last 72 hours.  Invalid input(s): PCO2, PO2  Studies/Results: No results found.  Anti-infectives: Anti-infectives (From admission, onward)   Start     Dose/Rate Route Frequency Ordered Stop   09/06/18 0600  ceFAZolin (ANCEF) IVPB 2g/100 mL premix     2 g 200 mL/hr over 30 Minutes Intravenous On call to O.R. 09/06/18 8110 09/06/18 0736      Assessment/Plan: s/p Procedure(s): XI ROBOTIC HIATAL REDO HERNIA REPAIR WITH MESH AND FUNDOPLICATION (N/A) INSERTION OF MESH  For esophogram today.  If no leak, will start clear liquids  LOS: 0 days    Abigail Miyamoto 09/07/2018

## 2018-09-08 MED ORDER — OXYCODONE HCL 5 MG/5ML PO SOLN: 5.0000 mg | Freq: Four times a day (QID) | ORAL | 0 refills | Status: DC | PRN

## 2018-09-08 NOTE — Discharge Summary (Signed)
Physician Discharge Summary  Patient ID: Darlene [name] MRN: [id number] DOB/AGE: 1971/04/24 48 y.o.  Admit date: 09/06/2018 Discharge date: 09/08/2018  Admission Diagnoses:  Discharge Diagnoses:  Active Problems:   S/P Nissen fundoplication (without gastrostomy tube) procedure   Discharged Condition: good  Hospital Course: uneventful post op recovery.  eosphogram without leak.  Started on a diet.  Tolerated well.  Minimal pain.  Discharged home  Consults: None  Significant Diagnostic Studies:   Treatments: surgery:   Discharge Exam: Blood pressure 118/77, pulse 73, temperature 98.8 F (37.1 C), temperature source Oral, resp. rate 16, height 5' (1.524 m), weight 93.6 kg, last menstrual period 08/10/2018, SpO2 98 %. General appearance: alert, cooperative and no distress Resp: clear to auscultation bilaterally Cardio: regular rate and rhythm, S1, S2 normal, no murmur, click, rub or gallop Incision/Wound:abdomen soft, non-tender, incisions healing well  Disposition: Discharge disposition: 01-Home or Self Care        Allergies as of 09/08/2018   No Known Allergies     Medication List    TAKE these medications   acetaminophen-codeine 300-30 MG tablet Commonly known as:  TYLENOL #3 Take 1-2 tablets by mouth every 6 (six) hours as needed for moderate pain. What changed:  how much to take   citalopram 20 MG tablet Commonly known as:  CeleXA Take 1 tablet (20 mg total) by mouth daily.   clonazepam 0.125 MG disintegrating tablet Commonly known as:  KLONOPIN Take 0.125-0.25 mg by mouth at bedtime.   cyclobenzaprine 10 MG tablet Commonly known as:  FLEXERIL Take 1 tablet (10 mg total) by mouth 3 (three) times daily as needed for muscle spasms.   diclofenac 75 MG EC tablet Commonly known as:  VOLTAREN Take 1-2 tablets daily What changed:    how much to take  how to take this  when to take this  additional instructions   hydrochlorothiazide 25 MG  tablet Commonly known as:  HYDRODIURIL Take 1 tablet (25 mg total) by mouth daily with breakfast.   ibuprofen 800 MG tablet Commonly known as:  ADVIL TAKE 1 TABLET (800 MG TOTAL) BY MOUTH 3 (THREE) TIMES DAILY AS NEEDED FOR HEADACHE. What changed:  See the new instructions.   linaclotide 145 MCG Caps capsule Commonly known as:  Linzess Take 1 capsule (145 mcg total) by mouth daily before breakfast. What changed:    when to take this  reasons to take this   Liraglutide -Weight Management 18 MG/3ML Sopn Commonly known as:  Saxenda Inject 2 mg into the skin daily. What changed:  how much to take   loratadine 10 MG tablet Commonly known as:  CLARITIN TAKE 1 TABLET BY MOUTH EVERY DAY FOR ALLERGIES What changed:  See the new instructions.   metoprolol succinate 25 MG 24 hr tablet Commonly known as:  TOPROL-XL Take 0.5-1 tablets (12.5-25 mg total) by mouth daily. What changed:  how much to take   oxyCODONE 5 MG/5ML solution Commonly known as:  ROXICODONE Take 5 mLs (5 mg total) by mouth every 6 (six) hours as needed for moderate pain or severe pain.   pantoprazole 40 MG tablet Commonly known as:  PROTONIX Take 1 tablet (40 mg total) by mouth 2 (two) times daily. What changed:    when to take this  additional instructions   penicillin v potassium 500 MG tablet Commonly known as:  VEETID Take 1 tablet (500 mg total) by mouth 4 (four) times daily.   potassium chloride SA 20 MEQ tablet Commonly known  as:  Klor-Con M20 TAKE 1 TABLET ONCE A DAY ORALLY 30 DAYS What changed:    how much to take  how to take this  when to take this  additional instructions   pramipexole 0.25 MG tablet Commonly known as:  MIRAPEX Take 0.75 mg by mouth 2 (two) times daily. Supper and bedtime   ProAir HFA 108 (90 Base) MCG/ACT inhaler Generic drug:  albuterol INHALE 2 PUFFS AS NEEDED EVERY 4 HOURS What changed:  See the new instructions.   Vitamin D (Ergocalciferol) 1.25 MG (50000  UT) Caps capsule Commonly known as:  DRISDOL TAKE 1 CAPSULE (50,000 UNITS TOTAL) BY MOUTH 2 (TWO) TIMES A WEEK. TAKES ON SUNDAY'S AND WEDNESDAY'S      Follow-up Information    Axel Filleramirez, Armando, MD. Call in 2 weeks.   Specialty:  General Surgery Why:  Post op visit Contact information: [address] [place] [id number] [phone]           Signed: Abigail MiyamotoDouglas Valree [name] 09/08/2018, 8:35 AM

## 2018-09-08 NOTE — Progress Notes (Signed)
Pt ready for DC to home. PIV removed, printed discharge instructions given to pt and reviewed with nurse. Pt denies any questions or concerns and verbalizes understanding of problems to notify MD about. Pt discharged home with family

## 2018-09-08 NOTE — Progress Notes (Signed)
Patient ID: Darlene [name], female   DOB: [birth year], 48 y.o.   MRN: [id number]  Doing well No pain Tolerating liquids Esophogram looks good Wants to go home  Plan: Discharge home

## 2018-09-09 ENCOUNTER — Encounter (HOSPITAL_COMMUNITY): Payer: Self-pay | Admitting: General Surgery

## 2018-09-16 ENCOUNTER — Encounter: Payer: Self-pay | Admitting: Physician Assistant

## 2018-09-17 ENCOUNTER — Other Ambulatory Visit: Payer: Self-pay | Admitting: Physician Assistant

## 2018-09-17 MED ORDER — POTASSIUM CHLORIDE 20 MEQ PO PACK: 20.0000 meq | PACK | Freq: Every day | ORAL | 5 refills | Status: DC

## 2018-09-17 NOTE — Progress Notes (Signed)
otassium  

## 2018-10-05 ENCOUNTER — Other Ambulatory Visit: Payer: Self-pay | Admitting: Physician Assistant

## 2018-10-08 ENCOUNTER — Other Ambulatory Visit: Payer: Self-pay

## 2018-10-09 ENCOUNTER — Encounter: Payer: Self-pay | Admitting: Physician Assistant

## 2018-10-09 ENCOUNTER — Ambulatory Visit: Payer: BLUE CROSS/BLUE SHIELD | Admitting: Physician Assistant

## 2018-10-09 VITALS — BP 112/72 | HR 65 | Temp 97.3°F | Ht 60.0 in | Wt 206.2 lb

## 2018-10-09 DIAGNOSIS — M722 Plantar fascial fibromatosis: Secondary | ICD-10-CM | POA: Diagnosis not present

## 2018-10-09 DIAGNOSIS — Z0000 Encounter for general adult medical examination without abnormal findings: Secondary | ICD-10-CM

## 2018-10-09 DIAGNOSIS — M47816 Spondylosis without myelopathy or radiculopathy, lumbar region: Secondary | ICD-10-CM | POA: Diagnosis not present

## 2018-10-09 LAB — LIPID PANEL

## 2018-10-10 ENCOUNTER — Other Ambulatory Visit: Payer: Self-pay | Admitting: Physician Assistant

## 2018-10-10 LAB — CMP14+EGFR
ALT: 19 IU/L (ref 0–32)
AST: 16 IU/L (ref 0–40)
Albumin/Globulin Ratio: 1.7 (ref 1.2–2.2)
Albumin: 3.8 g/dL (ref 3.8–4.8)
Alkaline Phosphatase: 83 IU/L (ref 39–117)
BUN/Creatinine Ratio: 16 (ref 9–23)
BUN: 12 mg/dL (ref 6–24)
Bilirubin Total: 0.3 mg/dL (ref 0.0–1.2)
CO2: 25 mmol/L (ref 20–29)
Calcium: 9 mg/dL (ref 8.7–10.2)
Chloride: 101 mmol/L (ref 96–106)
Creatinine, Ser: 0.74 mg/dL (ref 0.57–1.00)
GFR calc Af Amer: 113 mL/min/{1.73_m2} (ref >59)
GFR calc non Af Amer: 98 mL/min/{1.73_m2} (ref >59)
Globulin, Total: 2.3 g/dL (ref 1.5–4.5)
Glucose: 83 mg/dL (ref 65–99)
Potassium: 3.4 mmol/L — ABNORMAL LOW (ref 3.5–5.2)
Sodium: 143 mmol/L (ref 134–144)
Total Protein: 6.1 g/dL (ref 6.0–8.5)

## 2018-10-10 LAB — LIPID PANEL
Chol/HDL Ratio: 3.4 ratio (ref 0.0–4.4)
Cholesterol, Total: 192 mg/dL (ref 100–199)
HDL: 56 mg/dL (ref >39)
LDL Calculated: 106 mg/dL — ABNORMAL HIGH (ref 0–99)
Triglycerides: 152 mg/dL — ABNORMAL HIGH (ref 0–149)
VLDL Cholesterol Cal: 30 mg/dL (ref 5–40)

## 2018-10-10 LAB — TSH: TSH: 1.36 u[IU]/mL (ref 0.450–4.500)

## 2018-10-10 MED ORDER — IBUPROFEN 800 MG PO TABS: 800.0000 mg | ORAL_TABLET | Freq: Three times a day (TID) | ORAL | 0 refills | Status: DC

## 2018-10-10 MED ORDER — VITAMIN D (ERGOCALCIFEROL) 1.25 MG (50000 UNIT) PO CAPS: 50000.0000 [IU] | ORAL_CAPSULE | ORAL | 0 refills | Status: DC

## 2018-10-10 NOTE — Progress Notes (Addendum)
18 MME    BP 112/72   Pulse 65   Temp (!) 97.3 F (36.3 C) (Oral)   Ht 5' (1.524 m)   Wt 206 lb 3.2 oz (93.5 kg)   BMI 40.27 kg/m    Subjective:    Patient ID: Darlene [name], female    DOB: August 27, 1970, 48 y.o.   MRN: [id number]  HPI: Darlene [name] is a 48 y.o. female presenting on 10/09/2018 for Pain (3 month )  Patient recently had her hiatal hernia repaired and is doing very well.  She will be going back to work in just a couple of weeks.  She has been tolerating all of her medications well not having any difficulty.  She is due some labs.  Also she is here for a chronic pain management follow-up.  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 Plantar fasciitis  This patient returns for a 3 month recheck on narcotic use for the above named conditions  Current medications-acetaminophen with codeine 1 to 2 tablets every 6 hours as needed for severe pain Diclofenac 75 mg 1 daily Flexeril 10 mg 3 times a day  Medication side effects-no Any concerns-no  Pain on scale of 1-10-5 Frequency-both days What increases pain-standing What makes pain Better-rest Effects on ADL -no Any change in general medical condition-no  Effectiveness of current meds-good Adverse reactions form pain meds-no PMP AWARE website reviewed: Yes Any suspicious activity on PMP Aware: No MME daily dose: 20 LME daily dose:0.5  Contract on file 03/15/18 Obtain UDS at next visit    Past Medical History:  Diagnosis Date  . Fatty liver   . Fatty liver   . GERD (gastroesophageal reflux disease)   . Hiatal hernia   . Hypertension   . Hypokalemia   . OA (osteoarthritis)   . Plantar fasciitis   . Vitamin D deficiency    Relevant past medical, surgical, family and social history reviewed and updated as indicated. Interim medical history since our last visit reviewed. Allergies and medications reviewed and updated. DATA REVIEWED: CHART IN EPIC  Family History reviewed for  pertinent findings.  Review of Systems  Constitutional: Negative.   HENT: Negative.   Eyes: Negative.   Respiratory: Negative.   Gastrointestinal: Negative.   Genitourinary: Negative.     Allergies as of 10/09/2018   No Known Allergies     Medication List       Accurate as of Oct 09, 2018 11:59 PM. If you have any questions, ask your nurse or doctor.        STOP taking these medications   oxyCODONE 5 MG/5ML solution Commonly known as:  ROXICODONE Stopped by:  Terald Sleeper, PA-C   penicillin v potassium 500 MG tablet Commonly known as:  VEETID Stopped by:  Terald Sleeper, PA-C     TAKE these medications   acetaminophen-codeine 300-30 MG tablet Commonly known as:  TYLENOL #3 Take 1-2 tablets by mouth every 6 (six) hours as needed for moderate pain. What changed:  how much to take   citalopram 20 MG tablet Commonly known as:  CeleXA Take 1 tablet (20 mg total) by mouth daily.   clonazepam 0.125 MG disintegrating tablet Commonly known as:  KLONOPIN Take 0.125-0.25 mg by mouth at bedtime.   cyclobenzaprine 10 MG tablet Commonly known as:  FLEXERIL Take 1 tablet (10 mg total) by mouth 3 (three) times daily as needed for muscle spasms.   diclofenac 75 MG EC tablet Commonly known  as:  VOLTAREN Take 1-2 tablets daily What changed:    how much to take  how to take this  when to take this  additional instructions   hydrochlorothiazide 25 MG tablet Commonly known as:  HYDRODIURIL Take 1 tablet (25 mg total) by mouth daily with breakfast.   ibuprofen 800 MG tablet Commonly known as:  ADVIL TAKE 1 TABLET (800 MG TOTAL) BY MOUTH 3 (THREE) TIMES DAILY AS NEEDED FOR HEADACHE. What changed:  See the new instructions.   linaclotide 145 MCG Caps capsule Commonly known as:  Linzess Take 1 capsule (145 mcg total) by mouth daily before breakfast. What changed:    when to take this  reasons to take this   Liraglutide -Weight Management 18 MG/3ML Sopn Commonly  known as:  Saxenda Inject 2 mg into the skin daily. What changed:  how much to take   loratadine 10 MG tablet Commonly known as:  CLARITIN TAKE 1 TABLET BY MOUTH EVERY DAY FOR ALLERGIES What changed:  See the new instructions.   metoprolol succinate 25 MG 24 hr tablet Commonly known as:  TOPROL-XL Take 0.5-1 tablets (12.5-25 mg total) by mouth daily. What changed:  how much to take   pantoprazole 40 MG tablet Commonly known as:  PROTONIX Take 1 tablet (40 mg total) by mouth 2 (two) times daily. What changed:    when to take this  additional instructions   potassium chloride 20 MEQ packet Commonly known as:  Klor-Con Take 20 mEq by mouth daily.   potassium chloride SA 20 MEQ tablet Commonly known as:  Klor-Con M20 TAKE 1 TABLET ONCE A DAY ORALLY 30 DAYS What changed:    how much to take  how to take this  when to take this  additional instructions   pramipexole 0.25 MG tablet Commonly known as:  MIRAPEX Take 0.75 mg by mouth 2 (two) times daily. Supper and bedtime   ProAir HFA 108 (90 Base) MCG/ACT inhaler Generic drug:  albuterol INHALE 2 PUFFS AS NEEDED EVERY 4 HOURS What changed:  See the new instructions.   Vitamin D (Ergocalciferol) 1.25 MG (50000 UT) Caps capsule Commonly known as:  DRISDOL TAKE 1 CAPSULE (50,000 UNITS TOTAL) BY MOUTH 2 (TWO) TIMES A WEEK. TAKES ON SUNDAY'S AND WEDNESDAY'S          Objective:    BP 112/72   Pulse 65   Temp (!) 97.3 F (36.3 C) (Oral)   Ht 5' (1.524 m)   Wt 206 lb 3.2 oz (93.5 kg)   BMI 40.27 kg/m   No Known Allergies  Wt Readings from Last 3 Encounters:  10/09/18 206 lb 3.2 oz (93.5 kg)  09/06/18 206 lb 5 oz (93.6 kg)  09/03/18 206 lb 5 oz (93.6 kg)    Physical Exam Constitutional:      Appearance: She is well-developed.  HENT:     Head: Normocephalic and atraumatic.  Eyes:     Conjunctiva/sclera: Conjunctivae normal.     Pupils: Pupils are equal, round, and reactive to light.  Cardiovascular:      Rate and Rhythm: Normal rate and regular rhythm.     Heart sounds: Normal heart sounds.  Pulmonary:     Effort: Pulmonary effort is normal.     Breath sounds: Normal breath sounds.  Skin:    General: Skin is warm and dry.     Findings: No rash.  Neurological:     Mental Status: She is alert and oriented to person, place, and  time.     Deep Tendon Reflexes: Reflexes are normal and symmetric.  Psychiatric:        Behavior: Behavior normal.        Thought Content: Thought content normal.        Judgment: Judgment normal.     Results for orders placed or performed in visit on 10/09/18  CMP14+EGFR  Result Value Ref Range   Glucose 83 65 - 99 mg/dL   BUN 12 6 - 24 mg/dL   Creatinine, Ser 0.74 0.57 - 1.00 mg/dL   GFR calc non Af Amer 98 >59 mL/min/1.73   GFR calc Af Amer 113 >59 mL/min/1.73   BUN/Creatinine Ratio 16 9 - 23   Sodium 143 134 - 144 mmol/L   Potassium 3.4 (L) 3.5 - 5.2 mmol/L   Chloride 101 96 - 106 mmol/L   CO2 25 20 - 29 mmol/L   Calcium 9.0 8.7 - 10.2 mg/dL   Total Protein 6.1 6.0 - 8.5 g/dL   Albumin 3.8 3.8 - 4.8 g/dL   Globulin, Total 2.3 1.5 - 4.5 g/dL   Albumin/Globulin Ratio 1.7 1.2 - 2.2   Bilirubin Total 0.3 0.0 - 1.2 mg/dL   Alkaline Phosphatase 83 39 - 117 IU/L   AST 16 0 - 40 IU/L   ALT 19 0 - 32 IU/L  Lipid panel  Result Value Ref Range   Cholesterol, Total 192 100 - 199 mg/dL   Triglycerides 152 (H) 0 - 149 mg/dL   HDL 56 >39 mg/dL   VLDL Cholesterol Cal 30 5 - 40 mg/dL   LDL Calculated 106 (H) 0 - 99 mg/dL   Chol/HDL Ratio 3.4 0.0 - 4.4 ratio  TSH  Result Value Ref Range   TSH 1.360 0.450 - 4.500 uIU/mL      Assessment & Plan:   1. Spondylosis of lumbar region without myelopathy or radiculopathy Acetaminophen with codeine 1-2 every 6 hours for severe pain Flexeril 3 times a day as needed Diclofenac 75 mg 1 daily  2. Plantar fasciitis Diclofenac 75 mg 1 daily  3. Well adult exam - CMP14+EGFR - Lipid panel - TSH   Continue all  other maintenance medications as listed above.  Follow up plan: Return in about 3 months (around 01/09/2019) for recheck.  Educational handout given for [place] PA-C [facility] [address]  [place], [place] [address] [phone]   10/10/2018, 9:17 PM

## 2018-10-12 ENCOUNTER — Other Ambulatory Visit: Payer: Self-pay | Admitting: Physician Assistant

## 2018-10-14 MED ORDER — VITAMIN D (ERGOCALCIFEROL) 1.25 MG (50000 UNIT) PO CAPS: 50000.0000 [IU] | ORAL_CAPSULE | ORAL | 3 refills | Status: DC

## 2018-12-23 ENCOUNTER — Encounter: Payer: Self-pay | Admitting: Physician Assistant

## 2018-12-23 ENCOUNTER — Other Ambulatory Visit: Payer: Self-pay | Admitting: Physician Assistant

## 2018-12-23 MED ORDER — POTASSIUM CHLORIDE ER 20 MEQ PO TBCR: 20.0000 meq | EXTENDED_RELEASE_TABLET | Freq: Every day | ORAL | 1 refills | Status: DC

## 2018-12-27 DIAGNOSIS — K432 Incisional hernia without obstruction or gangrene: Secondary | ICD-10-CM | POA: Diagnosis not present

## 2018-12-31 ENCOUNTER — Other Ambulatory Visit: Payer: Self-pay | Admitting: General Surgery

## 2018-12-31 DIAGNOSIS — K432 Incisional hernia without obstruction or gangrene: Secondary | ICD-10-CM

## 2019-01-03 ENCOUNTER — Ambulatory Visit
Admission: RE | Admit: 2019-01-03 | Discharge: 2019-01-03 | Disposition: A | Discharge status: Home / Self Care | Payer: BC Managed Care – PPO | Source: Ambulatory Visit | Attending: General Surgery | Admitting: General Surgery

## 2019-01-03 DIAGNOSIS — K432 Incisional hernia without obstruction or gangrene: Secondary | ICD-10-CM

## 2019-01-03 DIAGNOSIS — R1013 Epigastric pain: Secondary | ICD-10-CM | POA: Diagnosis not present

## 2019-01-04 ENCOUNTER — Other Ambulatory Visit: Payer: Self-pay | Admitting: Physician Assistant

## 2019-01-06 ENCOUNTER — Telehealth: Payer: Self-pay | Admitting: *Deleted

## 2019-01-06 NOTE — Telephone Encounter (Addendum)
Prior Auth for Saxenda 18mg /61ml-APPROVED for dates of 09/09/2018-09/08/2019  Key: [id number]  Your information has been submitted to [facility]. [employer] [place] will review the request and fax you a determination directly, typically within 3 business days of your submission once all necessary information is received.  If [employer] has not responded in 3 business days or if you have any questions about your submission, contact [place] at [phone].

## 2019-01-09 ENCOUNTER — Other Ambulatory Visit: Payer: Self-pay | Admitting: General Surgery

## 2019-01-09 ENCOUNTER — Other Ambulatory Visit: Payer: Self-pay | Admitting: Physician Assistant

## 2019-01-09 DIAGNOSIS — K432 Incisional hernia without obstruction or gangrene: Secondary | ICD-10-CM

## 2019-01-10 ENCOUNTER — Other Ambulatory Visit: Payer: Self-pay

## 2019-01-13 ENCOUNTER — Other Ambulatory Visit: Payer: Self-pay

## 2019-01-13 ENCOUNTER — Ambulatory Visit: Payer: BC Managed Care – PPO | Admitting: Physician Assistant

## 2019-01-13 ENCOUNTER — Encounter: Payer: Self-pay | Admitting: Physician Assistant

## 2019-01-13 VITALS — BP 110/77 | HR 80 | Temp 97.6°F | Ht 60.0 in | Wt 219.4 lb

## 2019-01-13 DIAGNOSIS — M722 Plantar fascial fibromatosis: Secondary | ICD-10-CM

## 2019-01-13 DIAGNOSIS — M159 Polyosteoarthritis, unspecified: Secondary | ICD-10-CM

## 2019-01-13 DIAGNOSIS — M47816 Spondylosis without myelopathy or radiculopathy, lumbar region: Secondary | ICD-10-CM

## 2019-01-13 MED ORDER — GABAPENTIN 100 MG PO CAPS: 100.0000 mg | ORAL_CAPSULE | Freq: Three times a day (TID) | ORAL | 3 refills | Status: DC

## 2019-01-13 MED ORDER — ACETAMINOPHEN-CODEINE #3 300-30 MG PO TABS: 1.0000 | ORAL_TABLET | Freq: Four times a day (QID) | ORAL | 5 refills | Status: DC | PRN

## 2019-01-15 LAB — TOXASSURE SELECT 13 (MW), URINE

## 2019-01-17 ENCOUNTER — Ambulatory Visit
Admission: RE | Admit: 2019-01-17 | Discharge: 2019-01-17 | Disposition: A | Discharge status: Home / Self Care | Payer: BC Managed Care – PPO | Source: Ambulatory Visit | Attending: General Surgery | Admitting: General Surgery

## 2019-01-17 DIAGNOSIS — K429 Umbilical hernia without obstruction or gangrene: Secondary | ICD-10-CM | POA: Diagnosis not present

## 2019-01-17 DIAGNOSIS — K432 Incisional hernia without obstruction or gangrene: Secondary | ICD-10-CM

## 2019-01-20 ENCOUNTER — Encounter: Payer: Self-pay | Admitting: Physician Assistant

## 2019-01-20 NOTE — Progress Notes (Signed)
BP 110/77   Pulse 80   Temp 97.6 F (36.4 C) (Temporal)   Ht 5' (1.524 m)   Wt 219 lb 6.4 oz (99.5 kg)   BMI 42.85 kg/m    Subjective:    Patient ID: Darlene [name], female    DOB: [birth year], 48 y.o.   MRN: [id number]  HPI: Darlene [name] is a 48 y.o. female presenting on 01/13/2019 for Pain (3 month )   The patient comes in for chronic medical condition follow-up.  She also comes in for weight recheck.  She has been taking [place].  She is taking 3 mg/day.  She is however 13 pounds.  She has had a great change in her diet because of her stomach bothering her again.  She will be going to Dr. Daphene Calamity again soon for the evaluation of her severe hiatal hernia again. The patient does for mammograms with right diagnostic center.  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 Plantar fasciitis  This patient returns for a 3 month recheck on narcotic use for the above named conditions  Current medications-acetaminophen with codeine 1 to 2 tablets every 6 hours as needed for severe pain Diclofenac 75 mg 1 daily Flexeril 10 mg 3 times a day  Medication side effects-no Any concerns-no  Pain on scale of 1-10-5 Frequency-both days What increases pain-standing What makes pain Better-rest Effects on ADL -no Any change in general medical condition-no  Effectiveness of current meds-good Adverse reactions form pain meds-no PMP AWARE website reviewed: Yes Any suspicious activity on PMP Aware: No MME daily dose: 20 LME daily dose:0.5  Contract on file 03/15/18 UDS 01/13/2019  Past Medical History:  Diagnosis Date  . Fatty liver   . Fatty liver   . GERD (gastroesophageal reflux disease)   . Hiatal hernia   . Hypertension   . Hypokalemia   . OA (osteoarthritis)   . Plantar fasciitis   . Vitamin D deficiency    Relevant past medical, surgical, family and social history reviewed and updated as indicated.  Interim medical history since our last visit reviewed. Allergies and medications reviewed and updated. DATA REVIEWED: CHART IN EPIC  Family History reviewed for pertinent findings.  Review of Systems  Constitutional: Negative.  Negative for activity change, fatigue and fever.  HENT: Negative.   Eyes: Negative.   Respiratory: Negative.  Negative for cough.   Cardiovascular: Negative.  Negative for chest pain.  Gastrointestinal: Positive for abdominal distention. Negative for abdominal pain.  Endocrine: Negative.   Genitourinary: Negative.  Negative for dysuria.  Musculoskeletal: Positive for arthralgias, back pain, gait problem and joint swelling.  Skin: Negative.     Allergies as of 01/13/2019   No Known Allergies     Medication List       Accurate as of January 13, 2019 11:59 PM. If you have any questions, ask your nurse or doctor.        acetaminophen-codeine 300-30 MG tablet Commonly known as: TYLENOL #3 Take 1-2 tablets by mouth every 6 (six) hours as needed for moderate pain. What changed: how much to take   B-D UF III MINI PEN NEEDLES 31G X 5 MM Misc Generic drug: Insulin Pen Needle USE AS DIRECTED   citalopram 20 MG tablet Commonly known as: CeleXA Take 1 tablet (20 mg total) by mouth daily.   clonazepam 0.125 MG disintegrating tablet Commonly known as: KLONOPIN Take 0.125-0.25 mg by mouth  at bedtime.   cyclobenzaprine 10 MG tablet Commonly known as: FLEXERIL Take 1 tablet (10 mg total) by mouth 3 (three) times daily as needed for muscle spasms.   diclofenac 75 MG EC tablet Commonly known as: VOLTAREN Take 1-2 tablets daily What changed:   how much to take  how to take this  when to take this  additional instructions   gabapentin 100 MG capsule Commonly known as: NEURONTIN Take 1-6 capsules (100-600 mg total) by mouth 3 (three) times daily. Started by: Remus LofflerAngel S Axiel Fjeld, PA-C   hydrochlorothiazide 25 MG tablet Commonly known as: HYDRODIURIL Take  1 tablet (25 mg total) by mouth daily with breakfast.   ibuprofen 800 MG tablet Commonly known as: ADVIL TAKE 1 TABLET BY MOUTH THREE TIMES A DAY   linaclotide 145 MCG Caps capsule Commonly known as: Linzess Take 1 capsule (145 mcg total) by mouth daily before breakfast. What changed:   when to take this  reasons to take this   loratadine 10 MG tablet Commonly known as: CLARITIN TAKE 1 TABLET BY MOUTH EVERY DAY FOR ALLERGIES   metoprolol succinate 25 MG 24 hr tablet Commonly known as: TOPROL-XL Take 0.5-1 tablets (12.5-25 mg total) by mouth daily. What changed: how much to take   pantoprazole 40 MG tablet Commonly known as: PROTONIX Take 1 tablet (40 mg total) by mouth 2 (two) times daily. What changed:   when to take this  additional instructions   Potassium Chloride ER 20 MEQ Tbcr Take 20 mEq by mouth daily.   potassium chloride SA 20 MEQ tablet Commonly known as: Klor-Con M20 TAKE 1 TABLET ONCE A DAY ORALLY 30 DAYS What changed:   how much to take  how to take this  when to take this  additional instructions   pramipexole 0.25 MG tablet Commonly known as: MIRAPEX Take 0.75 mg by mouth 2 (two) times daily. Supper and bedtime   ProAir HFA 108 (90 Base) MCG/ACT inhaler Generic drug: albuterol INHALE 2 PUFFS AS NEEDED EVERY 4 HOURS What changed: See the new instructions.   Saxenda 18 MG/3ML Sopn Generic drug: Liraglutide -Weight Management Inject 3 mg into the skin daily.   Vitamin D (Ergocalciferol) 1.25 MG (50000 UT) Caps capsule Commonly known as: DRISDOL Take 1 capsule (50,000 Units total) by mouth 2 (two) times a week. Takes on Sunday's and Wednesday's          Objective:    BP 110/77   Pulse 80   Temp 97.6 F (36.4 C) (Temporal)   Ht 5' (1.524 m)   Wt 219 lb 6.4 oz (99.5 kg)   BMI 42.85 kg/m   No Known Allergies  Wt Readings from Last 3 Encounters:  01/13/19 219 lb 6.4 oz (99.5 kg)  10/09/18 206 lb 3.2 oz (93.5 kg)  09/06/18  206 lb 5 oz (93.6 kg)    Physical Exam Constitutional:      Appearance: She is well-developed.  HENT:     Head: Normocephalic and atraumatic.     Right Ear: Tympanic membrane, ear canal and external ear normal.     Left Ear: Tympanic membrane, ear canal and external ear normal.     Nose: Nose normal. No rhinorrhea.     Mouth/Throat:     Pharynx: No oropharyngeal exudate or posterior oropharyngeal erythema.  Eyes:     Conjunctiva/sclera: Conjunctivae normal.     Pupils: Pupils are equal, round, and reactive to light.  Neck:     Musculoskeletal: Normal range of  motion and neck supple.  Cardiovascular:     Rate and Rhythm: Normal rate and regular rhythm.     Heart sounds: Normal heart sounds.  Pulmonary:     Effort: Pulmonary effort is normal.     Breath sounds: Normal breath sounds.  Abdominal:     General: Bowel sounds are normal.     Palpations: Abdomen is soft.  Skin:    General: Skin is warm and dry.     Findings: No rash.  Neurological:     Mental Status: She is alert and oriented to person, place, and time.     Deep Tendon Reflexes: Reflexes are normal and symmetric.  Psychiatric:        Behavior: Behavior normal.        Thought Content: Thought content normal.        Judgment: Judgment normal.     Results for orders placed or performed in visit on 01/13/19  ToxASSURE Select 13 (MW), Urine  Result Value Ref Range   Summary Note       Assessment & Plan:   1. Spondylosis of lumbar region without myelopathy or radiculopathy - acetaminophen-codeine (TYLENOL #3) 300-30 MG tablet; Take 1-2 tablets by mouth every 6 (six) hours as needed for moderate pain.  Dispense: 90 tablet; Refill: 5 - gabapentin (NEURONTIN) 100 MG capsule; Take 1-6 capsules (100-600 mg total) by mouth 3 (three) times daily.  Dispense: 180 capsule; Refill: 3 - ToxASSURE Select 13 (MW), Urine  2. Generalized OA - acetaminophen-codeine (TYLENOL #3) 300-30 MG tablet; Take 1-2 tablets by mouth every 6  (six) hours as needed for moderate pain.  Dispense: 90 tablet; Refill: 5  3. Plantar fasciitis - acetaminophen-codeine (TYLENOL #3) 300-30 MG tablet; Take 1-2 tablets by mouth every 6 (six) hours as needed for moderate pain.  Dispense: 90 tablet; Refill: 5 - gabapentin (NEURONTIN) 100 MG capsule; Take 1-6 capsules (100-600 mg total) by mouth 3 (three) times daily.  Dispense: 180 capsule; Refill: 3   Continue all other maintenance medications as listed above.  Follow up plan: Recheck 6 months  Educational handout given for survey  Remus Loffler PA-C [facility] [address]  [place], [place] [address] [phone]   01/20/2019, 7:24 PM

## 2019-01-21 ENCOUNTER — Other Ambulatory Visit: Payer: Self-pay | Admitting: Physician Assistant

## 2019-01-21 DIAGNOSIS — I471 Supraventricular tachycardia: Secondary | ICD-10-CM

## 2019-01-24 ENCOUNTER — Encounter: Payer: Self-pay | Admitting: Physician Assistant

## 2019-02-14 ENCOUNTER — Other Ambulatory Visit: Payer: Self-pay

## 2019-02-17 ENCOUNTER — Ambulatory Visit (INDEPENDENT_AMBULATORY_CARE_PROVIDER_SITE_OTHER): Payer: BC Managed Care – PPO | Admitting: Physician Assistant

## 2019-02-17 ENCOUNTER — Other Ambulatory Visit: Payer: Self-pay

## 2019-02-17 ENCOUNTER — Encounter: Payer: Self-pay | Admitting: Physician Assistant

## 2019-02-17 VITALS — BP 104/70 | HR 79 | Temp 97.3°F | Ht 60.0 in | Wt 221.0 lb

## 2019-02-17 DIAGNOSIS — Z01419 Encounter for gynecological examination (general) (routine) without abnormal findings: Secondary | ICD-10-CM | POA: Diagnosis not present

## 2019-02-17 DIAGNOSIS — Z1231 Encounter for screening mammogram for malignant neoplasm of breast: Secondary | ICD-10-CM | POA: Diagnosis not present

## 2019-02-17 DIAGNOSIS — Z124 Encounter for screening for malignant neoplasm of cervix: Secondary | ICD-10-CM

## 2019-02-17 NOTE — Progress Notes (Signed)
BP 104/70   Pulse 79   Temp (!) 97.3 F (36.3 C) (Temporal)   Ht 5' (1.524 m)   Wt 221 lb (100.2 kg)   LMP 02/10/2019   SpO2 96%   BMI 43.16 kg/m    Subjective:    Patient ID: Darlene [name], female    DOB: May 10, 1971, 48 y.o.   MRN: [id number]  HPI: Darlene [name] is a 48 y.o. female presenting on 02/17/2019 for Annual Exam  This patient comes in for annual well physical examination. All medications are reviewed today. There are no reports of any problems with the medications. All of the medical conditions are reviewed and updated.  Lab work is reviewed and will be ordered as medically necessary. There are no new problems reported with today's visit.  Patient reports doing well overall.  Due labs and mammogram  Past Medical History:  Diagnosis Date  . Fatty liver   . Fatty liver   . GERD (gastroesophageal reflux disease)   . Hiatal hernia   . Hypertension   . Hypokalemia   . OA (osteoarthritis)   . Plantar fasciitis   . Vitamin D deficiency    Relevant past medical, surgical, family and social history reviewed and updated as indicated. Interim medical history since our last visit reviewed. Allergies and medications reviewed and updated. DATA REVIEWED: CHART IN EPIC  Family History reviewed for pertinent findings.  Review of Systems  Constitutional: Negative.   HENT: Negative.   Eyes: Negative.   Respiratory: Negative.   Gastrointestinal: Negative.   Genitourinary: Negative.     Allergies as of 02/17/2019   No Known Allergies     Medication List       Accurate as of February 17, 2019  8:52 PM. If you have any questions, ask your nurse or doctor.        acetaminophen-codeine 300-30 MG tablet Commonly known as: TYLENOL #3 Take 1-2 tablets by mouth every 6 (six) hours as needed for moderate pain.   B-D UF III MINI PEN NEEDLES 31G X 5 MM Misc Generic drug: Insulin Pen Needle USE AS DIRECTED   citalopram 20 MG tablet Commonly known as: CeleXA Take 1  tablet (20 mg total) by mouth daily.   clonazepam 0.125 MG disintegrating tablet Commonly known as: KLONOPIN Take 0.125-0.25 mg by mouth at bedtime.   cyclobenzaprine 10 MG tablet Commonly known as: FLEXERIL Take 1 tablet (10 mg total) by mouth 3 (three) times daily as needed for muscle spasms.   diclofenac 75 MG EC tablet Commonly known as: VOLTAREN Take 1-2 tablets daily What changed:   how much to take  how to take this  when to take this  additional instructions   gabapentin 100 MG capsule Commonly known as: NEURONTIN Take 1-6 capsules (100-600 mg total) by mouth 3 (three) times daily.   hydrochlorothiazide 25 MG tablet Commonly known as: HYDRODIURIL Take 1 tablet (25 mg total) by mouth daily with breakfast.   ibuprofen 800 MG tablet Commonly known as: ADVIL TAKE 1 TABLET BY MOUTH THREE TIMES A DAY   linaclotide 145 MCG Caps capsule Commonly known as: Linzess Take 1 capsule (145 mcg total) by mouth daily before breakfast. What changed:   when to take this  reasons to take this   loratadine 10 MG tablet Commonly known as: CLARITIN TAKE 1 TABLET BY MOUTH EVERY DAY FOR ALLERGIES   metoprolol succinate 25 MG 24 hr tablet Commonly known as: TOPROL-XL Take 1 tablet (25 mg total)  by mouth daily.   pantoprazole 40 MG tablet Commonly known as: PROTONIX Take 1 tablet (40 mg total) by mouth 2 (two) times daily. What changed:   when to take this  additional instructions   Potassium Chloride ER 20 MEQ Tbcr Take 20 mEq by mouth daily.   potassium chloride SA 20 MEQ tablet Commonly known as: Klor-Con M20 TAKE 1 TABLET ONCE A DAY ORALLY 30 DAYS What changed:   how much to take  how to take this  when to take this  additional instructions   pramipexole 0.25 MG tablet Commonly known as: MIRAPEX Take 0.75 mg by mouth 2 (two) times daily. Supper and bedtime   ProAir HFA 108 (90 Base) MCG/ACT inhaler Generic drug: albuterol INHALE 2 PUFFS AS NEEDED  EVERY 4 HOURS What changed: See the new instructions.   Saxenda 18 MG/3ML Sopn Generic drug: Liraglutide -Weight Management Inject 3 mg into the skin daily.   Vitamin D (Ergocalciferol) 1.25 MG (50000 UT) Caps capsule Commonly known as: DRISDOL Take 1 capsule (50,000 Units total) by mouth 2 (two) times a week. Takes on Sunday's and Wednesday's          Objective:    BP 104/70   Pulse 79   Temp (!) 97.3 F (36.3 C) (Temporal)   Ht 5' (1.524 m)   Wt 221 lb (100.2 kg)   LMP 02/10/2019   SpO2 96%   BMI 43.16 kg/m   No Known Allergies  Wt Readings from Last 3 Encounters:  02/17/19 221 lb (100.2 kg)  01/13/19 219 lb 6.4 oz (99.5 kg)  10/09/18 206 lb 3.2 oz (93.5 kg)    Physical Exam Constitutional:      General: She is not in acute distress.    Appearance: Normal appearance. She is well-developed.  HENT:     Head: Normocephalic and atraumatic.  Eyes:     Conjunctiva/sclera: Conjunctivae normal.     Pupils: Pupils are equal, round, and reactive to light.  Neck:     Musculoskeletal: Normal range of motion and neck supple.  Cardiovascular:     Rate and Rhythm: Normal rate and regular rhythm.     Heart sounds: Normal heart sounds.  Pulmonary:     Effort: Pulmonary effort is normal.     Breath sounds: Normal breath sounds.  Chest:     Breasts: Breasts are symmetrical.        Right: No mass, skin change or tenderness.        Left: No mass, skin change or tenderness.  Abdominal:     General: Bowel sounds are normal.     Palpations: Abdomen is soft.  Genitourinary:    General: Normal vulva.     Exam position: Supine.     Labia:        Right: No tenderness or lesion.        Left: No tenderness or lesion.      Vagina: Normal. No vaginal discharge, tenderness or bleeding.     Cervix: No cervical motion tenderness, discharge or friability.     Uterus: Not deviated, not enlarged and not tender.      Adnexa:        Right: No mass, tenderness or fullness.         Left:  No mass, tenderness or fullness.       Rectum: No anal fissure.  Skin:    General: Skin is warm and dry.     Findings: No rash.  Neurological:  Mental Status: She is alert and oriented to person, place, and time.     Deep Tendon Reflexes: Reflexes are normal and symmetric.  Psychiatric:        Behavior: Behavior normal.        Thought Content: Thought content normal.        Judgment: Judgment normal.     Results for orders placed or performed in visit on 01/13/19  ToxASSURE Select 13 (MW), Urine  Result Value Ref Range   Summary Note       Assessment & Plan:   1. Encounter for screening mammogram for breast cancer - [facility] Digital Diagnostic Bilat; Future HISTORY of fibrocystic changes  2. Screening for cervical cancer - IGP, Aptima HPV, rfx 16/18,45  3. Well female exam with routine gynecological exam - IGP, Aptima HPV, rfx 16/18,45 - MM Digital Diagnostic Bilat; Future   Continue all other maintenance medications as listed above.  Follow up plan: No follow-ups on file.  Educational handout given for health maintenance  Remus LofflerAngel S. Demorris Choyce PA-C [facility] [address]  [place], [place] [id number] [phone]   02/17/2019, 8:52 PM

## 2019-02-17 NOTE — Patient Instructions (Signed)
Health Maintenance, Female Adopting a healthy lifestyle and getting preventive care are important in promoting health and wellness. Ask your health care provider about:  The right schedule for you to have regular tests and exams.  Things you can do on your own to prevent diseases and keep yourself healthy. What should I know about diet, weight, and exercise? Eat a healthy diet   Eat a diet that includes plenty of vegetables, fruits, low-fat dairy products, and lean protein.  Do not eat a lot of foods that are high in solid fats, added sugars, or sodium. Maintain a healthy weight Body mass index (BMI) is used to identify weight problems. It estimates body fat based on height and weight. Your health care provider can help determine your BMI and help you achieve or maintain a healthy weight. Get regular exercise Get regular exercise. This is one of the most important things you can do for your health. Most adults should:  Exercise for at least 150 minutes each week. The exercise should increase your heart rate and make you sweat (moderate-intensity exercise).  Do strengthening exercises at least twice a week. This is in addition to the moderate-intensity exercise.  Spend less time sitting. Even light physical activity can be beneficial. Watch cholesterol and blood lipids Have your blood tested for lipids and cholesterol at 46 years of age, then have this test every 5 years. Have your cholesterol levels checked more often if:  Your lipid or cholesterol levels are high.  You are older than 46 years of age.  You are at high risk for heart disease. What should I know about cancer screening? Depending on your health history and family history, you may need to have cancer screening at various ages. This may include screening for:  Breast cancer.  Cervical cancer.  Colorectal cancer.  Skin cancer.  Lung cancer. What should I know about heart disease, diabetes, and high blood  pressure? Blood pressure and heart disease  High blood pressure causes heart disease and increases the risk of stroke. This is more likely to develop in people who have high blood pressure readings, are of African descent, or are overweight.  Have your blood pressure checked: ? Every 3-5 years if you are 18-39 years of age. ? Every year if you are 40 years old or older. Diabetes Have regular diabetes screenings. This checks your fasting blood sugar level. Have the screening done:  Once every three years after age 40 if you are at a normal weight and have a low risk for diabetes.  More often and at a younger age if you are overweight or have a high risk for diabetes. What should I know about preventing infection? Hepatitis B If you have a higher risk for hepatitis B, you should be screened for this virus. Talk with your health care provider to find out if you are at risk for hepatitis B infection. Hepatitis C Testing is recommended for:  Everyone born from 1945 through 1965.  Anyone with known risk factors for hepatitis C. Sexually transmitted infections (STIs)  Get screened for STIs, including gonorrhea and chlamydia, if: ? You are sexually active and are younger than 46 years of age. ? You are older than 46 years of age and your health care provider tells you that you are at risk for this type of infection. ? Your sexual activity has changed since you were last screened, and you are at increased risk for chlamydia or gonorrhea. Ask your health care provider if   you are at risk.  Ask your health care provider about whether you are at high risk for HIV. Your health care provider may recommend a prescription medicine to help prevent HIV infection. If you choose to take medicine to prevent HIV, you should first get tested for HIV. You should then be tested every 3 months for as long as you are taking the medicine. Pregnancy  If you are about to stop having your period (premenopausal) and  you may become pregnant, seek counseling before you get pregnant.  Take 400 to 800 micrograms (mcg) of folic acid every day if you become pregnant.  Ask for birth control (contraception) if you want to prevent pregnancy. Osteoporosis and menopause Osteoporosis is a disease in which the bones lose minerals and strength with aging. This can result in bone fractures. If you are 65 years old or older, or if you are at risk for osteoporosis and fractures, ask your health care provider if you should:  Be screened for bone loss.  Take a calcium or vitamin D supplement to lower your risk of fractures.  Be given hormone replacement therapy (HRT) to treat symptoms of menopause. Follow these instructions at home: Lifestyle  Do not use any products that contain nicotine or tobacco, such as cigarettes, e-cigarettes, and chewing tobacco. If you need help quitting, ask your health care provider.  Do not use street drugs.  Do not share needles.  Ask your health care provider for help if you need support or information about quitting drugs. Alcohol use  Do not drink alcohol if: ? Your health care provider tells you not to drink. ? You are pregnant, may be pregnant, or are planning to become pregnant.  If you drink alcohol: ? Limit how much you use to 0-1 drink a day. ? Limit intake if you are breastfeeding.  Be aware of how much alcohol is in your drink. In the [place] one drink equals one 12 oz bottle of beer (355 mL), one 5 oz glass of wine (148 mL), or one 1 oz glass of hard liquor (44 mL). General instructions  Schedule regular health, dental, and eye exams.  Stay current with your vaccines.  Tell your health care provider if: ? You often feel depressed. ? You have ever been abused or do not feel safe at home. Summary  Adopting a healthy lifestyle and getting preventive care are important in promoting health and wellness.  Follow your health care provider's instructions about healthy  diet, exercising, and getting tested or screened for diseases.  Follow your health care provider's instructions on monitoring your cholesterol and blood pressure. This information is not intended to replace advice given to you by your health care provider. Make sure you discuss any questions you have with your health care provider. Document Released: 11/14/2010 Document Revised: 04/24/2018 Document Reviewed: 04/24/2018 Elsevier Patient Education  2020 Elsevier Inc.  

## 2019-02-20 LAB — IGP, APTIMA HPV, RFX 16/18,45: HPV Aptima: NEGATIVE

## 2019-02-27 DIAGNOSIS — Z20828 Contact with and (suspected) exposure to other viral communicable diseases: Secondary | ICD-10-CM | POA: Diagnosis not present

## 2019-03-09 ENCOUNTER — Other Ambulatory Visit: Payer: Self-pay | Admitting: Physician Assistant

## 2019-03-09 DIAGNOSIS — M722 Plantar fascial fibromatosis: Secondary | ICD-10-CM

## 2019-03-18 ENCOUNTER — Other Ambulatory Visit: Payer: Self-pay | Admitting: Physician Assistant

## 2019-03-30 ENCOUNTER — Other Ambulatory Visit: Payer: Self-pay | Admitting: Physician Assistant

## 2019-04-14 ENCOUNTER — Encounter: Payer: Self-pay | Admitting: Physician Assistant

## 2019-04-14 ENCOUNTER — Ambulatory Visit (INDEPENDENT_AMBULATORY_CARE_PROVIDER_SITE_OTHER): Payer: BC Managed Care – PPO | Admitting: Physician Assistant

## 2019-04-14 DIAGNOSIS — K219 Gastro-esophageal reflux disease without esophagitis: Secondary | ICD-10-CM

## 2019-04-14 DIAGNOSIS — M47816 Spondylosis without myelopathy or radiculopathy, lumbar region: Secondary | ICD-10-CM | POA: Diagnosis not present

## 2019-04-14 DIAGNOSIS — U071 COVID-19: Secondary | ICD-10-CM | POA: Diagnosis not present

## 2019-04-14 NOTE — Progress Notes (Signed)
Telephone visit  Subjective: AC:[id number], recent Covid illness PCP: Remus Loffler, PA-C ZSW:FUXNA Darlene [name] is a 48 y.o. female calls for telephone consult today. Patient provides verbal consent for consult held via phone.  Patient is identified with 2 separate identifiers.  At this time the entire area is on COVID-19 social distancing and stay home orders are in place.  Patient is of higher risk and therefore we are performing this by a virtual method.  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  She states overall she is very stable with this.  Her stomach is under good control at this time.  She does have gastroenterology if needed.  She states that she really has been feeling very well overall.  She states that November 23 she did have a positive Covid test after being exposed to her mother who was positive.  She has had a little bit of headache and cough.  But otherwise has felt okay.  She is able to return to work on 04/18/2019.  She has been in touch with her [facility] health department.  We will write a note for her to be able to return to work.  Other than that patient states that she is doing well not having any other complaints.   ROS: Per HPI  No Known Allergies Past Medical History:  Diagnosis Date  . Fatty liver   . Fatty liver   . GERD (gastroesophageal reflux disease)   . Hiatal hernia   . Hypertension   . Hypokalemia   . OA (osteoarthritis)   . Plantar fasciitis   . Vitamin D deficiency     Current Outpatient Medications:  .  acetaminophen-codeine (TYLENOL #3) 300-30 MG tablet, Take 1-2 tablets by mouth every 6 (six) hours as needed for moderate pain., Disp: 90 tablet, Rfl: 5 .  B-D UF III MINI PEN NEEDLES 31G X 5 MM MISC, USE AS DIRECTED, Disp: 100 each, Rfl: 2 .  citalopram  (CELEXA) 20 MG tablet, Take 1 tablet (20 mg total) by mouth daily., Disp: 90 tablet, Rfl: 3 .  clonazepam (KLONOPIN) 0.125 MG disintegrating tablet, Take 0.125-0.25 mg by mouth at bedtime., Disp: , Rfl:  .  cyclobenzaprine (FLEXERIL) 10 MG tablet, Take 1 tablet (10 mg total) by mouth 3 (three) times daily as needed for muscle spasms., Disp: 30 tablet, Rfl: 0 .  gabapentin (NEURONTIN) 100 MG capsule, Take 1-6 capsules (100-600 mg total) by mouth 3 (three) times daily., Disp: 180 capsule, Rfl: 3 .  hydrochlorothiazide (HYDRODIURIL) 25 MG tablet, Take 1 tablet (25 mg total) by mouth daily with breakfast., Disp: 90 tablet, Rfl: 3 .  ibuprofen (ADVIL) 800 MG tablet, TAKE 1 TABLET BY MOUTH THREE TIMES A DAY, Disp: 270 tablet, Rfl: 0 .  linaclotide (LINZESS) 145 MCG CAPS capsule, Take 1 capsule (145 mcg total) by mouth daily before breakfast. (Patient taking differently: Take 145 mcg by mouth daily as needed (constipation). ), Disp: 30 capsule, Rfl: 5 .  Liraglutide -Weight Management (SAXENDA) 18 MG/3ML SOPN, Inject 3 mg into the skin daily., Disp: 15 pen, Rfl: 5 .  loratadine (CLARITIN) 10 MG tablet, TAKE 1 TABLET BY MOUTH EVERY DAY FOR ALLERGIES, Disp: 90 tablet, Rfl: 1 .  metoprolol succinate (TOPROL-XL) 25 MG 24 hr tablet, Take 1 tablet (25 mg total)  by mouth daily., Disp: 90 tablet, Rfl: 1 .  pantoprazole (PROTONIX) 40 MG tablet, Take 1 tablet (40 mg total) by mouth 2 (two) times daily. (Patient taking differently: Take 40 mg by mouth See admin instructions. Take 40 mg in the morning and my take a second 40 mg dose later as needed for heartburn), Disp: 60 tablet, Rfl: 11 .  Potassium Chloride ER 20 MEQ TBCR, Take 20 mEq by mouth daily., Disp: 90 tablet, Rfl: 1 .  potassium chloride SA (KLOR-CON M20) 20 MEQ tablet, TAKE 1 TABLET ONCE A DAY ORALLY 30 DAYS (Patient taking differently: Take 20 mEq by mouth daily. ), Disp: 90 tablet, Rfl: 3 .  pramipexole (MIRAPEX) 0.25 MG tablet, Take 0.75 mg by mouth 2  (two) times daily. Supper and bedtime, Disp: , Rfl:  .  PROAIR HFA 108 (90 Base) MCG/ACT inhaler, INHALE 2 PUFFS AS NEEDED EVERY 4 HOURS (Patient taking differently: Inhale 2 puffs into the lungs every 4 (four) hours as needed for wheezing or shortness of breath. ), Disp: 8.5 Inhaler, Rfl: 2 .  Vitamin D, Ergocalciferol, (DRISDOL) 1.25 MG (50000 UT) CAPS capsule, Take 1 capsule (50,000 Units total) by mouth 2 (two) times a week. Takes on Sunday's and Wednesday's, Disp: 24 capsule, Rfl: 3  Assessment/ Plan: 48 y.o. female   1. COVID-19 Out of work 04/07/2019 through 04/18/2019.  2. Gastroesophageal reflux disease, unspecified whether esophagitis present Continue pantoprazole 40 mg twice daily  3. Spondylosis of lumbar region without myelopathy or radiculopathy Continue stretching and exercises Continue ibuprofen Continue pain medicine on an infrequent basis  No follow-ups on file.  Continue all other maintenance medications as listed above.  Start time: 2:36 PM End time: 2:46 PM  No orders of the defined types were placed in this encounter.   Particia Nearing PA-C [place] [phone]

## 2019-05-01 ENCOUNTER — Other Ambulatory Visit: Payer: Self-pay | Admitting: Physician Assistant

## 2019-05-01 DIAGNOSIS — K219 Gastro-esophageal reflux disease without esophagitis: Secondary | ICD-10-CM

## 2019-05-02 ENCOUNTER — Other Ambulatory Visit: Payer: Self-pay | Admitting: Physician Assistant

## 2019-05-06 ENCOUNTER — Other Ambulatory Visit: Payer: Self-pay | Admitting: Physician Assistant

## 2019-05-06 DIAGNOSIS — F411 Generalized anxiety disorder: Secondary | ICD-10-CM

## 2019-05-19 ENCOUNTER — Other Ambulatory Visit: Payer: Self-pay | Admitting: Physician Assistant

## 2019-05-19 ENCOUNTER — Encounter: Payer: Self-pay | Admitting: Physician Assistant

## 2019-05-19 MED ORDER — CYCLOBENZAPRINE HCL 10 MG PO TABS: 10.0000 mg | ORAL_TABLET | Freq: Three times a day (TID) | ORAL | 2 refills | Status: DC | PRN

## 2019-05-26 ENCOUNTER — Other Ambulatory Visit: Payer: Self-pay | Admitting: Physician Assistant

## 2019-05-28 ENCOUNTER — Other Ambulatory Visit: Payer: Self-pay | Admitting: Physician Assistant

## 2019-05-28 DIAGNOSIS — F411 Generalized anxiety disorder: Secondary | ICD-10-CM

## 2019-06-04 ENCOUNTER — Telehealth: Payer: Self-pay | Admitting: Physician Assistant

## 2019-06-04 NOTE — Telephone Encounter (Signed)
French Ana from [facility] called, because they received our referral for patient to have mammogram. Wants to know if this will be just a yearly mammogram or does patient have other things going on. Wants nurse to call her back and confirm.

## 2019-06-06 NOTE — Telephone Encounter (Signed)
Called mammo dept and [facility].

## 2019-06-10 ENCOUNTER — Encounter: Payer: Self-pay | Admitting: Physician Assistant

## 2019-06-10 DIAGNOSIS — Z1231 Encounter for screening mammogram for malignant neoplasm of breast: Secondary | ICD-10-CM | POA: Diagnosis not present

## 2019-06-12 NOTE — Telephone Encounter (Signed)
Patient has had exam

## 2019-06-21 ENCOUNTER — Other Ambulatory Visit: Payer: Self-pay | Admitting: Physician Assistant

## 2019-06-26 ENCOUNTER — Other Ambulatory Visit: Payer: Self-pay | Admitting: Physician Assistant

## 2019-07-04 ENCOUNTER — Encounter: Payer: Self-pay | Admitting: Physician Assistant

## 2019-07-17 ENCOUNTER — Other Ambulatory Visit: Payer: Self-pay | Admitting: Physician Assistant

## 2019-07-18 ENCOUNTER — Other Ambulatory Visit: Payer: Self-pay | Admitting: Physician Assistant

## 2019-07-18 MED ORDER — HYDROCHLOROTHIAZIDE 25 MG PO TABS: 25.0000 mg | ORAL_TABLET | Freq: Every day | ORAL | 0 refills | Status: DC

## 2019-07-22 ENCOUNTER — Encounter: Payer: Self-pay | Admitting: Physician Assistant

## 2019-07-22 ENCOUNTER — Other Ambulatory Visit: Payer: Self-pay | Admitting: Physician Assistant

## 2019-07-24 DIAGNOSIS — Z23 Encounter for immunization: Secondary | ICD-10-CM | POA: Diagnosis not present

## 2019-07-28 ENCOUNTER — Other Ambulatory Visit: Payer: Self-pay | Admitting: Physician Assistant

## 2019-08-06 ENCOUNTER — Encounter: Payer: Self-pay | Admitting: Physician Assistant

## 2019-08-23 DIAGNOSIS — Z23 Encounter for immunization: Secondary | ICD-10-CM | POA: Diagnosis not present

## 2019-08-25 ENCOUNTER — Other Ambulatory Visit: Payer: Self-pay | Admitting: *Deleted

## 2019-08-25 DIAGNOSIS — I471 Supraventricular tachycardia, unspecified: Secondary | ICD-10-CM

## 2019-08-25 MED ORDER — METOPROLOL SUCCINATE ER 25 MG PO TB24: 25.0000 mg | ORAL_TABLET | Freq: Every day | ORAL | 0 refills | Status: DC

## 2019-08-27 ENCOUNTER — Telehealth: Payer: Self-pay

## 2019-08-27 NOTE — Telephone Encounter (Signed)
Request for PA sent for Saxenda 18mg /46ml pen injections  Key [id number]  Sent to plan and awaiting result

## 2019-08-29 ENCOUNTER — Telehealth: Payer: Self-pay | Admitting: *Deleted

## 2019-08-29 NOTE — Telephone Encounter (Signed)
Prior auth for Saxenda 18MG /3ML pen-injectors- Approved 08/27/2019-08/26/2020   (Key: [id number])  PHARMACY AWARE

## 2019-09-04 ENCOUNTER — Ambulatory Visit: Payer: BC Managed Care – PPO | Admitting: Physician Assistant

## 2019-09-08 ENCOUNTER — Other Ambulatory Visit: Payer: Self-pay

## 2019-09-08 ENCOUNTER — Ambulatory Visit: Payer: BC Managed Care – PPO | Admitting: Family Medicine

## 2019-09-08 ENCOUNTER — Ambulatory Visit (INDEPENDENT_AMBULATORY_CARE_PROVIDER_SITE_OTHER): Payer: BC Managed Care – PPO

## 2019-09-08 ENCOUNTER — Encounter: Payer: Self-pay | Admitting: Family Medicine

## 2019-09-08 VITALS — BP 104/72 | HR 83 | Temp 98.4°F | Ht 60.0 in | Wt 240.6 lb

## 2019-09-08 DIAGNOSIS — R7989 Other specified abnormal findings of blood chemistry: Secondary | ICD-10-CM

## 2019-09-08 DIAGNOSIS — Z79899 Other long term (current) drug therapy: Secondary | ICD-10-CM | POA: Diagnosis not present

## 2019-09-08 DIAGNOSIS — G8929 Other chronic pain: Secondary | ICD-10-CM

## 2019-09-08 DIAGNOSIS — Z7689 Persons encountering health services in other specified circumstances: Secondary | ICD-10-CM | POA: Diagnosis not present

## 2019-09-08 DIAGNOSIS — M4807 Spinal stenosis, lumbosacral region: Secondary | ICD-10-CM | POA: Diagnosis not present

## 2019-09-08 DIAGNOSIS — M47816 Spondylosis without myelopathy or radiculopathy, lumbar region: Secondary | ICD-10-CM

## 2019-09-08 DIAGNOSIS — M25562 Pain in left knee: Secondary | ICD-10-CM

## 2019-09-08 DIAGNOSIS — Z79891 Long term (current) use of opiate analgesic: Secondary | ICD-10-CM | POA: Diagnosis not present

## 2019-09-08 MED ORDER — ACETAMINOPHEN-CODEINE #3 300-30 MG PO TABS: 1.0000 | ORAL_TABLET | Freq: Three times a day (TID) | ORAL | 1 refills | Status: DC | PRN

## 2019-09-08 NOTE — Patient Instructions (Signed)
It was a pleasure meeting you!  I will call you with xray results.  Just going to get a baseline.  I have given you Dr Francena Hanly information for weight.  I certainly think getting some weight off will help your knee and back and hopefully, get you off some meds (yay!).  I have given you home physical therapy exercises to do to help as well.  See me in 3-6 months for medication refill.

## 2019-09-08 NOTE — Progress Notes (Signed)
Subjective: CC: elevated LFTs, chronic pain PCP: Janora Norlander, DO PZW:CHENI Darlene [name] is a 49 y.o. female presenting to clinic today for:  1. Chronic pain Patient reports chronic left knee pain status post surgical repair 15 years ago.  She points to the anterior patella.  She notes that this is intermittent as far as exacerbations go.  She uses ibuprofen about twice per week before using the Tylenol 3 which she also uses fairly rarely.  She never uses more than 2 tablets a day at max uses it 4 times per week.  She reports compliance with gabapentin which also helps with pain.  She has a rare use of the Flexeril which she actually uses for abdominal spasms not her back or knee.  She reports mid line low back pain that has been ongoing for a while.  She works in a factory.   Denies any radiation to the lower extremities. No weakness, falls.  No sensation changes.  No saddle anesthesia.  No fecal incontinence or urinary retention.  She seen a chiropractor and was told that "something was going on on the right side".  She has never had baseline x-rays here nor seen a specialist otherwise for this back pain.  Back pain is not constant.  It is intermittent and relieved by the above medications.  Patient denies excessive daytime sleepiness, constipation, mental status changes with the Tylenol 3.  She has discontinued clonazepam, which was prescribed by her neurologist.  2.  Elevated liver function test/ obesity Patient had labs done with her job and her liver function tests were noted to be elevated.  Does not report any alcohol consumption.  No drug use.  She uses Tylenol 3 intermittently as above but never in excess.  She does have history of fatty liver disease that was found when they did a surgery for hiatal hernia.  She does admit to weight gain since the repeat surgery because she has been eating more of what she wants.  She is on Saxenda but notes that even at 3 mg daily she has not seen much  improvement in her weight.  She has been previously treated with phentermine as well.  ROS: Per HPI  No Known Allergies Past Medical History:  Diagnosis Date  . Fatty liver   . Fatty liver   . GERD (gastroesophageal reflux disease)   . Hiatal hernia   . Hypertension   . Hypokalemia   . OA (osteoarthritis)   . Plantar fasciitis   . Vitamin D deficiency     Current Outpatient Medications:  .  acetaminophen-codeine (TYLENOL #3) 300-30 MG tablet, Take 1-2 tablets by mouth every 6 (six) hours as needed for moderate pain., Disp: 90 tablet, Rfl: 5 .  albuterol (PROAIR HFA) 108 (90 Base) MCG/ACT inhaler, Inhale 2 puffs into the lungs every 4 (four) hours as needed for wheezing or shortness of breath., Disp: 6.7 g, Rfl: 0 .  B-D UF III MINI PEN NEEDLES 31G X 5 MM MISC, USE AS DIRECTED, Disp: 100 each, Rfl: 2 .  citalopram (CELEXA) 20 MG tablet, Take 1 tablet (20 mg total) by mouth daily. (Needs to be seen before next refill), Disp: 30 tablet, Rfl: 0 .  clonazepam (KLONOPIN) 0.125 MG disintegrating tablet, Take 0.125-0.25 mg by mouth at bedtime., Disp: , Rfl:  .  cyclobenzaprine (FLEXERIL) 10 MG tablet, Take 1 tablet (10 mg total) by mouth 3 (three) times daily as needed for muscle spasms., Disp: 30 tablet, Rfl: 2 .  gabapentin (NEURONTIN) 100 MG capsule, Take 1-6 capsules (100-600 mg total) by mouth 3 (three) times daily., Disp: 180 capsule, Rfl: 3 .  hydrochlorothiazide (HYDRODIURIL) 25 MG tablet, Take 1 tablet (25 mg total) by mouth daily with breakfast. (Needs to be seen before next refill), Disp: 30 tablet, Rfl: 0 .  ibuprofen (ADVIL) 800 MG tablet, TAKE 1 TABLET BY MOUTH THREE TIMES A DAY, Disp: 270 tablet, Rfl: 0 .  linaclotide (LINZESS) 145 MCG CAPS capsule, Take 1 capsule (145 mcg total) by mouth daily before breakfast. (Patient taking differently: Take 145 mcg by mouth daily as needed (constipation). ), Disp: 30 capsule, Rfl: 5 .  Liraglutide -Weight Management (SAXENDA) 18 MG/3ML SOPN,  Inject 3 mg into the skin daily., Disp: 15 pen, Rfl: 5 .  loratadine (CLARITIN) 10 MG tablet, TAKE 1 TABLET BY MOUTH EVERY DAY FOR ALLERGIES, Disp: 90 tablet, Rfl: 1 .  metoprolol succinate (TOPROL-XL) 25 MG 24 hr tablet, Take 1 tablet (25 mg total) by mouth daily., Disp: 90 tablet, Rfl: 0 .  pantoprazole (PROTONIX) 40 MG tablet, TAKE 1 TABLET BY MOUTH TWICE A DAY, Disp: 180 tablet, Rfl: 1 .  Potassium Chloride ER 20 MEQ TBCR, TAKE 1 TABLET BY MOUTH EVERY DAY, Disp: 90 tablet, Rfl: 0 .  potassium chloride SA (KLOR-CON M20) 20 MEQ tablet, TAKE 1 TABLET ONCE A DAY ORALLY 30 DAYS (Patient taking differently: Take 20 mEq by mouth daily. ), Disp: 90 tablet, Rfl: 3 .  pramipexole (MIRAPEX) 0.25 MG tablet, Take 0.75 mg by mouth 2 (two) times daily. Supper and bedtime, Disp: , Rfl:  .  Vitamin D, Ergocalciferol, (DRISDOL) 1.25 MG (50000 UT) CAPS capsule, Take 1 capsule (50,000 Units total) by mouth 2 (two) times a week. Takes on Sunday's and Wednesday's, Disp: 24 capsule, Rfl: 3 Social History   Socioeconomic History  . Marital status: Married    Spouse name: Not on file  . Number of children: 3  . Years of education: Not on file  . Highest education level: Not on file  Occupational History  . Occupation: TEXTILE  Tobacco Use  . Smoking status: Never Smoker  . Smokeless tobacco: Never Used  Substance and Sexual Activity  . Alcohol use: No    Alcohol/week: 0.0 standard drinks  . Drug use: No  . Sexual activity: Not on file  Other Topics Concern  . Not on file  Social History Narrative  . Not on file   Social Determinants of Health   Financial Resource Strain:   . Difficulty of Paying Living Expenses:   Food Insecurity:   . Worried About Programme researcher, broadcasting/film/video in the Last Year:   . Barista in the Last Year:   Transportation Needs:   . Freight forwarder (Medical):   Marland Kitchen Lack of Transportation (Non-Medical):   Physical Activity:   . Days of Exercise per Week:   . Minutes of  Exercise per Session:   Stress:   . Feeling of Stress :   Social Connections:   . Frequency of Communication with Friends and Family:   . Frequency of Social Gatherings with Friends and Family:   . Attends Religious Services:   . Active Member of Clubs or Organizations:   . Attends Banker Meetings:   Marland Kitchen Marital Status:   Intimate Partner Violence:   . Fear of Current or Ex-Partner:   . Emotionally Abused:   Marland Kitchen Physically Abused:   . Sexually Abused:    Family  History  Problem Relation Age of Onset  . High blood pressure Mother   . Diabetes Mother   . Kidney cancer Mother   . Hyperlipidemia Sister   . Heart disease Sister   . Heart disease Brother   . Hyperlipidemia Brother   . Colon cancer Neg Hx   . Rectal cancer Neg Hx   . Stomach cancer Neg Hx     Objective: Office vital signs reviewed. BP 104/72   Pulse 83   Temp 98.4 F (36.9 C)   Ht 5' (1.524 m)   Wt 240 lb 9.6 oz (109.1 kg)   LMP 08/29/2019   SpO2 96%   BMI 46.99 kg/m   Physical Examination:  General: Awake, alert, well appearing, obese, No acute distress HEENT: Normal, sclera white, MMM. n carotid bruits Cardio: regular rate and rhythm, S1S2 heard, no murmurs appreciated Pulm: clear to auscultation bilaterally, no wheezes, rhonchi or rales; normal work of breathing on room air Extremities: warm, well perfused, No edema, cyanosis or clubbing; +2 pulses bilaterally MSK: normal gait and station  Lumbar spine: no midline or paraspinal muscle TTP. No palpable bony abnormality.   Left Knee: No effusion or soft tissue swelling. No TTP. No palpable bony abnormality  Assessment/ Plan: 49 y.o. female   1. Elevated liver function tests Uncertain etiology.  We will repeat liver function panel as well as check hepatitis levels - Hepatic Function Panel - HepB+HepC+HIV Panel  2. Establishing care with new doctor, encounter for  3. Spondylosis of lumbar region without myelopathy or radiculopathy I  reviewed her chart.  Could not find previous imaging or referral for low back.  Will obtain baseline lumbar spine x-rays given intermittent need for opioid.  I do think that the patient is using the medication appropriately.  I have changed her prescription to reflect current usage.  The national narcotic database was reviewed and there were no red flags.  Of note she did have regular refills every 30 days until February when she used her last refill up.  Overall she is well-appearing today.  I think that going forward if her symptoms worsen or she is finding a more regular need for the controlled substance we may need to consider having her see a back specialist as I do not want to miss something that can be fixed early on while she is in fairly good health. - ToxASSURE Select 13 (MW), Urine - DG Lumbar Spine 2-3 Views; Future - acetaminophen-codeine (TYLENOL #3) 300-30 MG tablet; Take 1 tablet by mouth every 8 (eight) hours as needed for moderate pain or severe pain.  Dispense: 90 tablet; Refill: 1  4. Controlled substance agreement signed [facility], UDS - ToxASSURE Select 13 (MW), Urine  5. Chronic pain of left knee S/p surgery 15 years ago.  Stable. - acetaminophen-codeine (TYLENOL #3) 300-30 MG tablet; Take 1 tablet by mouth every 8 (eight) hours as needed for moderate pain or severe pain.  Dispense: 90 tablet; Refill: 1  6. Morbid obesity (HCC) Darlene [name]'s information provided.  For now continue Saxenda.   No orders of the defined types were placed in this encounter.  No orders of the defined types were placed in this encounter.    Raliegh Ip, DO [facility] [phone]

## 2019-09-09 ENCOUNTER — Encounter: Payer: Self-pay | Admitting: Family Medicine

## 2019-09-09 LAB — HEPB+HEPC+HIV PANEL
HIV Screen 4th Generation wRfx: NONREACTIVE
Hep B C IgM: NEGATIVE
Hep B Core Total Ab: NEGATIVE
Hep B E Ab: NEGATIVE
Hep B E Ag: NEGATIVE
Hep B Surface Ab, Qual: NONREACTIVE
Hep C Virus Ab: <0.1 s/co ratio (ref 0.0–0.9)
Hepatitis B Surface Ag: NEGATIVE

## 2019-09-09 LAB — HEPATIC FUNCTION PANEL
ALT: 21 IU/L (ref 0–32)
AST: 18 IU/L (ref 0–40)
Albumin: 4.3 g/dL (ref 3.8–4.8)
Alkaline Phosphatase: 123 IU/L — ABNORMAL HIGH (ref 39–117)
Bilirubin Total: 0.3 mg/dL (ref 0.0–1.2)
Bilirubin, Direct: 0.1 mg/dL (ref 0.00–0.40)
Total Protein: 7.1 g/dL (ref 6.0–8.5)

## 2019-09-10 LAB — TOXASSURE SELECT 13 (MW), URINE

## 2019-09-11 ENCOUNTER — Other Ambulatory Visit: Payer: Self-pay | Admitting: Family Medicine

## 2019-09-11 DIAGNOSIS — I471 Supraventricular tachycardia, unspecified: Secondary | ICD-10-CM

## 2019-09-12 ENCOUNTER — Encounter: Payer: Self-pay | Admitting: Family Medicine

## 2019-09-12 ENCOUNTER — Other Ambulatory Visit: Payer: Self-pay | Admitting: Family Medicine

## 2019-09-12 DIAGNOSIS — I471 Supraventricular tachycardia, unspecified: Secondary | ICD-10-CM

## 2019-09-15 ENCOUNTER — Encounter: Payer: Self-pay | Admitting: Family Medicine

## 2019-09-15 MED ORDER — HYDROCHLOROTHIAZIDE 25 MG PO TABS: 25.0000 mg | ORAL_TABLET | Freq: Every day | ORAL | 3 refills | Status: DC

## 2019-09-26 ENCOUNTER — Other Ambulatory Visit: Payer: Self-pay | Admitting: *Deleted

## 2019-09-26 MED ORDER — IBUPROFEN 800 MG PO TABS: 800.0000 mg | ORAL_TABLET | Freq: Three times a day (TID) | ORAL | 0 refills | Status: DC

## 2019-09-27 ENCOUNTER — Other Ambulatory Visit: Payer: Self-pay | Admitting: Family Medicine

## 2019-09-27 DIAGNOSIS — F411 Generalized anxiety disorder: Secondary | ICD-10-CM

## 2019-09-28 ENCOUNTER — Other Ambulatory Visit: Payer: Self-pay | Admitting: Family Medicine

## 2019-09-28 DIAGNOSIS — F411 Generalized anxiety disorder: Secondary | ICD-10-CM

## 2019-09-29 MED ORDER — CITALOPRAM HYDROBROMIDE 20 MG PO TABS: 20.0000 mg | ORAL_TABLET | Freq: Every day | ORAL | 0 refills | Status: DC

## 2019-10-22 ENCOUNTER — Other Ambulatory Visit: Payer: Self-pay | Admitting: *Deleted

## 2019-10-22 DIAGNOSIS — K219 Gastro-esophageal reflux disease without esophagitis: Secondary | ICD-10-CM

## 2019-10-22 MED ORDER — PANTOPRAZOLE SODIUM 40 MG PO TBEC: 40.0000 mg | DELAYED_RELEASE_TABLET | Freq: Two times a day (BID) | ORAL | 0 refills | Status: DC

## 2019-10-24 ENCOUNTER — Other Ambulatory Visit: Payer: Self-pay | Admitting: Family Medicine

## 2019-10-24 DIAGNOSIS — F411 Generalized anxiety disorder: Secondary | ICD-10-CM

## 2019-11-03 ENCOUNTER — Other Ambulatory Visit: Payer: Self-pay | Admitting: Family Medicine

## 2019-11-04 MED ORDER — POTASSIUM CHLORIDE ER 20 MEQ PO TBCR: 1.0000 | EXTENDED_RELEASE_TABLET | Freq: Every day | ORAL | 0 refills | Status: DC

## 2019-11-20 ENCOUNTER — Other Ambulatory Visit: Payer: Self-pay | Admitting: Family Medicine

## 2019-11-20 DIAGNOSIS — F411 Generalized anxiety disorder: Secondary | ICD-10-CM

## 2019-12-03 ENCOUNTER — Other Ambulatory Visit: Payer: Self-pay | Admitting: Family Medicine

## 2019-12-05 ENCOUNTER — Other Ambulatory Visit: Payer: Self-pay | Admitting: *Deleted

## 2019-12-08 ENCOUNTER — Ambulatory Visit: Payer: BC Managed Care – PPO | Admitting: Family Medicine

## 2019-12-08 ENCOUNTER — Encounter: Payer: Self-pay | Admitting: Family Medicine

## 2019-12-08 ENCOUNTER — Other Ambulatory Visit: Payer: Self-pay

## 2019-12-08 VITALS — BP 106/69 | HR 65 | Temp 98.2°F | Ht 60.0 in | Wt 237.0 lb

## 2019-12-08 DIAGNOSIS — F1190 Opioid use, unspecified, uncomplicated: Secondary | ICD-10-CM

## 2019-12-08 DIAGNOSIS — M47816 Spondylosis without myelopathy or radiculopathy, lumbar region: Secondary | ICD-10-CM | POA: Diagnosis not present

## 2019-12-08 DIAGNOSIS — M25562 Pain in left knee: Secondary | ICD-10-CM | POA: Diagnosis not present

## 2019-12-08 DIAGNOSIS — G8929 Other chronic pain: Secondary | ICD-10-CM

## 2019-12-08 MED ORDER — METHYLPREDNISOLONE ACETATE 40 MG/ML IJ SUSP
40.0000 mg | Freq: Once | INTRAMUSCULAR | Status: AC
  Administered 2019-12-08: 40 mg via INTRA_ARTICULAR

## 2019-12-08 MED ORDER — ACETAMINOPHEN-CODEINE #3 300-30 MG PO TABS: 1.0000 | ORAL_TABLET | Freq: Three times a day (TID) | ORAL | 1 refills | Status: DC | PRN

## 2019-12-08 NOTE — Patient Instructions (Addendum)
Ok to use Voltaren gel (diclofenac gel) 4 times daily on knees if needed.  Use Tylenol w/ codeine SPARINGLY.  Knee Injection A knee injection is a procedure to get medicine into your knee joint to relieve the pain, swelling, and stiffness of arthritis. Your health care provider uses a needle to inject medicine, which may also help to lubricate and cushion your knee joint. You may need more than one injection. Tell a health care provider about:  Any allergies you have.  All medicines you are taking, including vitamins, herbs, eye drops, creams, and over-the-counter medicines.  Any problems you or family members have had with anesthetic medicines.  Any blood disorders you have.  Any surgeries you have had.  Any medical conditions you have.  Whether you are pregnant or may be pregnant. What are the risks? Generally, this is a safe procedure. However, problems may occur, including:  Infection.  Bleeding.  Symptoms that get worse.  Damage to the area around your knee.  Allergic reaction to any of the medicines.  Skin reactions from repeated injections. What happens before the procedure?  Ask your health care provider about changing or stopping your regular medicines. This is especially important if you are taking diabetes medicines or blood thinners.  Plan to have someone take you home from the hospital or clinic. What happens during the procedure?   You will sit or lie down in a position for your knee to be treated.  The skin over your kneecap will be cleaned with a germ-killing soap.  You will be given a medicine that numbs the area (local anesthetic). You may feel some stinging.  The medicine will be injected into your knee. The needle is carefully placed between your kneecap and your knee. The medicine is injected into the joint space.  The needle will be removed at the end of the procedure.  A bandage (dressing) may be placed over the injection site. The procedure  may vary among health care providers and hospitals. What can I expect after the procedure?  Your blood pressure, heart rate, breathing rate, and blood oxygen level will be monitored until you leave the hospital or clinic.  You may have to move your knee through its full range of motion. This helps to get all the medicine into your joint space.  You will be watched to make sure that you do not have a reaction to the injected medicine.  You may feel more pain, swelling, and warmth than you did before the injection. This reaction may last about 1-2 days. Follow these instructions at home: Medicines  Take over-the-counter and prescription medicines only as told by your doctor.  Do not drive or use heavy machinery while taking prescription pain medicine.  Do not take medicines such as aspirin and ibuprofen unless your health care provider tells you to take them. Injection site care  Follow instructions from your health care provider about: ? How to take care of your puncture site. ? When and how you should change your dressing. ? When you should remove your dressing.  Check your injection area every day for signs of infection. Check for: ? More redness, swelling, or pain after 2 days. ? Fluid or blood. ? Pus or a bad smell. ? Warmth. Managing pain, stiffness, and swelling   If directed, put ice on the injection area: ? Put ice in a plastic bag. ? Place a towel between your skin and the bag. ? Leave the ice on for 20 minutes,  2-3 times per day.  Do not apply heat to your knee.  Raise (elevate) the injection area above the level of your heart while you are sitting or lying down. General instructions  If you were given a dressing, keep it dry until your health care provider says it can be removed. Ask your health care provider when you can start showering or taking a bath.  Avoid strenuous activities for as long as directed by your health care provider. Ask your health care  provider when you can return to your normal activities.  Keep all follow-up visits as told by your health care provider. This is important. You may need more injections. Contact a health care provider if you have:  A fever.  Warmth in your injection area.  Fluid, blood, or pus coming from your injection site.  Symptoms at your injection site that last longer than 2 days after your procedure. Get help right away if:  Your knee: ? Turns very red. ? Becomes very swollen. ? Is in severe pain. Summary  A knee injection is a procedure to get medicine into your knee joint to relieve the pain, swelling, and stiffness of arthritis.  A needle is carefully placed between your kneecap and your knee to inject medicine into the joint space.  Before the procedure, ask your health care provider about changing or stopping your regular medicines, especially if you are taking diabetes medicines or blood thinners.  Contact your health care provider if you have any problems or questions after your procedure. This information is not intended to replace advice given to you by your health care provider. Make sure you discuss any questions you have with your health care provider. Document Revised: 05/21/2017 Document Reviewed: 05/21/2017 Elsevier Patient Education  2020 [employer].

## 2019-12-08 NOTE — Progress Notes (Signed)
Subjective: CC: f/u chronic pain PCP: Raliegh Ip, DO [id number] Darlene [name] is a 49 y.o. female presenting to clinic today for:  1. Chronic pain History: chronic left knee pain status post surgical repair 15 years ago.    She has acute on chronic left-sided knee pain.  She notes that pain tends to be medial/superior to the kneecap.  No falls or gross swelling.  She is been using her Tylenol 3 a little bit more frequently than normal because of the increased pain.  She tries to be sparing with the Advil due to hiatal hernia and gastric irritation.  She is not had a corticosteroid to the knee but she would most certainly be willing to have this done.  She notes that the pain seems to be exacerbated because she is taken on a second job and now is working at both [employer] during the day and lows in the afternoon after not getting off until 10 PM each evening.    2.  obesity She has not yet sought out care for obesity.  She still has Norina Buzzard information but notes that her time has been a little bit more stretch due to taking on a second job.  She is not having any formal exercise due to time restrictions. No longer on Saxenda   ROS: Per HPI  No Known Allergies Past Medical History:  Diagnosis Date  . Fatty liver   . Fatty liver   . GERD (gastroesophageal reflux disease)   . Hiatal hernia   . Hypertension   . Hypokalemia   . OA (osteoarthritis)   . Plantar fasciitis   . Vitamin D deficiency     Current Outpatient Medications:  .  acetaminophen-codeine (TYLENOL #3) 300-30 MG tablet, Take 1 tablet by mouth every 8 (eight) hours as needed for moderate pain or severe pain., Disp: 90 tablet, Rfl: 1 .  albuterol (PROAIR HFA) 108 (90 Base) MCG/ACT inhaler, Inhale 2 puffs into the lungs every 4 (four) hours as needed for wheezing or shortness of breath., Disp: 6.7 g, Rfl: 0 .  citalopram (CELEXA) 20 MG tablet, TAKE 1 TABLET BY MOUTH EVERY DAY, Disp: 90 tablet, Rfl: 0 .   cyclobenzaprine (FLEXERIL) 10 MG tablet, Take 1 tablet (10 mg total) by mouth 3 (three) times daily as needed for muscle spasms., Disp: 30 tablet, Rfl: 2 .  gabapentin (NEURONTIN) 100 MG capsule, Take 1-6 capsules (100-600 mg total) by mouth 3 (three) times daily., Disp: 180 capsule, Rfl: 3 .  hydrochlorothiazide (HYDRODIURIL) 25 MG tablet, Take 1 tablet (25 mg total) by mouth daily with breakfast. (Needs to be seen before next refill), Disp: 90 tablet, Rfl: 3 .  ibuprofen (ADVIL) 800 MG tablet, Take 1 tablet (800 mg total) by mouth 3 (three) times daily., Disp: 270 tablet, Rfl: 0 .  loratadine (CLARITIN) 10 MG tablet, TAKE 1 TABLET BY MOUTH EVERY DAY FOR ALLERGIES, Disp: 90 tablet, Rfl: 1 .  metoprolol succinate (TOPROL-XL) 25 MG 24 hr tablet, Take 1 tablet (25 mg total) by mouth daily., Disp: 90 tablet, Rfl: 0 .  pantoprazole (PROTONIX) 40 MG tablet, Take 1 tablet (40 mg total) by mouth 2 (two) times daily., Disp: 180 tablet, Rfl: 0 .  Potassium Chloride ER 20 MEQ TBCR, Take 1 tablet by mouth daily., Disp: 90 tablet, Rfl: 0 .  potassium chloride SA (KLOR-CON M20) 20 MEQ tablet, TAKE 1 TABLET ONCE A DAY ORALLY 30 DAYS (Patient taking differently: Take 20 mEq by mouth daily. ),  Disp: 90 tablet, Rfl: 3 .  Vitamin D, Ergocalciferol, (DRISDOL) 1.25 MG (50000 UT) CAPS capsule, Take 1 capsule (50,000 Units total) by mouth 2 (two) times a week. Takes on Sunday's and Wednesday's, Disp: 24 capsule, Rfl: 3 .  pramipexole (MIRAPEX) 0.25 MG tablet, Take 0.75 mg by mouth 2 (two) times daily. Supper and bedtime, Disp: , Rfl:  Social History   Socioeconomic History  . Marital status: Married    Spouse name: Not on file  . Number of children: 3  . Years of education: Not on file  . Highest education level: Not on file  Occupational History  . Occupation: TEXTILE  Tobacco Use  . Smoking status: Never Smoker  . Smokeless tobacco: Never Used  Vaping Use  . Vaping Use: Never used  Substance and Sexual  Activity  . Alcohol use: No    Alcohol/week: 0.0 standard drinks  . Drug use: No  . Sexual activity: Not on file  Other Topics Concern  . Not on file  Social History Narrative  . Not on file   Social Determinants of Health   Financial Resource Strain:   . Difficulty of Paying Living Expenses:   Food Insecurity:   . Worried About Programme researcher, broadcasting/film/video in the Last Year:   . Barista in the Last Year:   Transportation Needs:   . Freight forwarder (Medical):   Marland Kitchen Lack of Transportation (Non-Medical):   Physical Activity:   . Days of Exercise per Week:   . Minutes of Exercise per Session:   Stress:   . Feeling of Stress :   Social Connections:   . Frequency of Communication with Friends and Family:   . Frequency of Social Gatherings with Friends and Family:   . Attends Religious Services:   . Active Member of Clubs or Organizations:   . Attends Banker Meetings:   Marland Kitchen Marital Status:   Intimate Partner Violence:   . Fear of Current or Ex-Partner:   . Emotionally Abused:   Marland Kitchen Physically Abused:   . Sexually Abused:    Family History  Problem Relation Age of Onset  . High blood pressure Mother   . Diabetes Mother   . Kidney cancer Mother   . Hyperlipidemia Sister   . Heart disease Sister   . Heart disease Brother   . Hyperlipidemia Brother   . Colon cancer Neg Hx   . Rectal cancer Neg Hx   . Stomach cancer Neg Hx     Objective: Office vital signs reviewed. BP 106/69   Pulse 65   Temp 98.2 F (36.8 C) (Temporal)   Ht 5' (1.524 m)   Wt (!) 237 lb (107.5 kg)   SpO2 98%   BMI 46.29 kg/m   Physical Examination:  General: Awake, alert, well appearing, obese, No acute distress HEENT: Normal, sclera white, MMM.  Cardio: regular rate and rhythm, S1S2 heard, no murmurs appreciated Pulm: clear to auscultation bilaterally, no wheezes, rhonchi or rales; normal work of breathing on room air Extremities: warm, well perfused, No edema, cyanosis or  clubbing; +2 pulses bilaterally MSK: normal gait and station  Left Knee: No effusion or soft tissue swelling. No TTP. No palpable bony abnormality.  No ligamentous laxity  JOINT INJECTION:  Patient denies allergy to antiseptics (including iodine) and anesthetics.  Patient without h/o diabetes, frequent steroid use, use of blood thinners/ antiplatelets.  Patient was given informed consent and a signed copy has been placed in  the chart. Appropriate time out was taken. Area prepped and draped in usual sterile fashion. Anatomic landmarks were identified and injection site was marked.  Ethyl chloride spray was used to numb the area and 1 cc of methylprednisolone 40 mg/ml plus  3 cc of 1% lidocaine without epinephrine was injected into the left knee using a(n) anteriolateral approach. The patient tolerated the procedure well and there were no immediate complications. Estimated blood loss is less than 1 cc.  Post procedure instructions were reviewed and handout outlining these instructions were provided to patient.   Assessment/ Plan: 49 y.o. female   1. Chronic pain of left knee Corticosteroid injection performed today.  Hopefully this will provide patient with relief.  We discussed okay to use topical Voltaren gel if needed up to 4 times daily.  I have renewed her Tylenol 3 but reinforced sparing use of this medication.  Should she continue to use this more frequently, may need to consider formal referral back to orthopedics.  She was good understanding.  National narcotic database was reviewed and there were no red flags. - acetaminophen-codeine (TYLENOL #3) 300-30 MG tablet; Take 1 tablet by mouth every 8 (eight) hours as needed for moderate pain or severe pain.  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  Dispense: 90 tablet; Refill: 1  3. Chronic, continuous use of opioids  4. Morbid obesity (HCC) Reinforced need for lifestyle modification, particularly given knee pain.    No orders of the defined types were placed in this encounter.  No orders of the defined types were placed in this encounter.    Raliegh Ip, DO [facility] [phone]

## 2019-12-09 ENCOUNTER — Other Ambulatory Visit: Payer: Self-pay | Admitting: Family Medicine

## 2019-12-09 DIAGNOSIS — M722 Plantar fascial fibromatosis: Secondary | ICD-10-CM | POA: Diagnosis not present

## 2019-12-09 DIAGNOSIS — I471 Supraventricular tachycardia: Secondary | ICD-10-CM

## 2019-12-09 DIAGNOSIS — M79671 Pain in right foot: Secondary | ICD-10-CM | POA: Diagnosis not present

## 2019-12-09 DIAGNOSIS — K219 Gastro-esophageal reflux disease without esophagitis: Secondary | ICD-10-CM

## 2019-12-09 DIAGNOSIS — M79672 Pain in left foot: Secondary | ICD-10-CM | POA: Diagnosis not present

## 2019-12-09 DIAGNOSIS — F411 Generalized anxiety disorder: Secondary | ICD-10-CM

## 2019-12-10 MED ORDER — CITALOPRAM HYDROBROMIDE 20 MG PO TABS: 20.0000 mg | ORAL_TABLET | Freq: Every day | ORAL | 0 refills | Status: DC

## 2019-12-10 MED ORDER — METOPROLOL SUCCINATE ER 25 MG PO TB24: 25.0000 mg | ORAL_TABLET | Freq: Every day | ORAL | 0 refills | Status: DC

## 2019-12-10 MED ORDER — PANTOPRAZOLE SODIUM 40 MG PO TBEC: 40.0000 mg | DELAYED_RELEASE_TABLET | Freq: Two times a day (BID) | ORAL | 0 refills | Status: DC

## 2019-12-10 MED ORDER — IBUPROFEN 800 MG PO TABS: 800.0000 mg | ORAL_TABLET | Freq: Three times a day (TID) | ORAL | 0 refills | Status: DC

## 2019-12-11 ENCOUNTER — Other Ambulatory Visit: Payer: Self-pay | Admitting: Family Medicine

## 2019-12-30 DIAGNOSIS — M79672 Pain in left foot: Secondary | ICD-10-CM | POA: Diagnosis not present

## 2019-12-30 DIAGNOSIS — M722 Plantar fascial fibromatosis: Secondary | ICD-10-CM | POA: Diagnosis not present

## 2019-12-31 ENCOUNTER — Other Ambulatory Visit: Payer: Self-pay

## 2019-12-31 MED ORDER — VITAMIN D (ERGOCALCIFEROL) 1.25 MG (50000 UNIT) PO CAPS: 50000.0000 [IU] | ORAL_CAPSULE | ORAL | 1 refills | Status: DC

## 2020-01-20 DIAGNOSIS — L03031 Cellulitis of right toe: Secondary | ICD-10-CM | POA: Diagnosis not present

## 2020-01-20 DIAGNOSIS — M79674 Pain in right toe(s): Secondary | ICD-10-CM | POA: Diagnosis not present

## 2020-01-27 ENCOUNTER — Encounter: Payer: Self-pay | Admitting: Family Medicine

## 2020-01-29 ENCOUNTER — Ambulatory Visit (INDEPENDENT_AMBULATORY_CARE_PROVIDER_SITE_OTHER): Payer: BC Managed Care – PPO | Admitting: Family Medicine

## 2020-01-29 ENCOUNTER — Other Ambulatory Visit: Payer: Self-pay

## 2020-01-29 ENCOUNTER — Encounter: Payer: Self-pay | Admitting: Family Medicine

## 2020-01-29 DIAGNOSIS — R399 Unspecified symptoms and signs involving the genitourinary system: Secondary | ICD-10-CM | POA: Diagnosis not present

## 2020-01-29 LAB — URINALYSIS, COMPLETE
Bilirubin, UA: NEGATIVE
Glucose, UA: NEGATIVE
Ketones, UA: NEGATIVE
Nitrite, UA: POSITIVE — AB
Specific Gravity, UA: 1.015 (ref 1.005–1.030)
Urobilinogen, Ur: 1 mg/dL (ref 0.2–1.0)
pH, UA: 7 (ref 5.0–7.5)

## 2020-01-29 LAB — MICROSCOPIC EXAMINATION

## 2020-01-29 MED ORDER — CEPHALEXIN 500 MG PO CAPS: 500.0000 mg | ORAL_CAPSULE | Freq: Four times a day (QID) | ORAL | 0 refills | Status: DC

## 2020-01-29 NOTE — Progress Notes (Signed)
Virtual Visit via telephone Note  I connected with Darlene [name] on 01/29/20 at 1723 by telephone and verified that I am speaking with the correct person using two identifiers. Darlene [name] is currently located at home and no other people are currently with her during visit. The provider, Elige Radon Joory Gough, MD is located in their office at time of visit.  Call ended at 1728  I discussed the limitations, risks, security and privacy concerns of performing an evaluation and management service by telephone and the availability of in person appointments. I also discussed with the patient that there may be a patient responsible charge related to this service. The patient expressed understanding and agreed to proceed.   History and Present Illness:  Patient is calling in for 3 days of pressure and urgency and frequency and dysuria.  She denies any fevers or chills. She denies vaginal discharge. She denies any hematuria 1. UTI symptoms     Outpatient Encounter Medications as of 01/29/2020  Medication Sig   acetaminophen-codeine (TYLENOL #3) 300-30 MG tablet Take 1 tablet by mouth every 8 (eight) hours as needed for moderate pain or severe pain.   albuterol (PROAIR HFA) 108 (90 Base) MCG/ACT inhaler Inhale 2 puffs into the lungs every 4 (four) hours as needed for wheezing or shortness of breath.   citalopram (CELEXA) 20 MG tablet Take 1 tablet (20 mg total) by mouth daily.   cyclobenzaprine (FLEXERIL) 10 MG tablet Take 1 tablet (10 mg total) by mouth 3 (three) times daily as needed for muscle spasms.   gabapentin (NEURONTIN) 100 MG capsule Take 1-6 capsules (100-600 mg total) by mouth 3 (three) times daily.   hydrochlorothiazide (HYDRODIURIL) 25 MG tablet Take 1 tablet (25 mg total) by mouth daily with breakfast. (Needs to be seen before next refill)   ibuprofen (ADVIL) 800 MG tablet Take 1 tablet (800 mg total) by mouth 3 (three) times daily.   loratadine (CLARITIN) 10 MG tablet  TAKE 1 TABLET BY MOUTH EVERY DAY FOR ALLERGIES   metoprolol succinate (TOPROL-XL) 25 MG 24 hr tablet Take 1 tablet (25 mg total) by mouth daily.   pantoprazole (PROTONIX) 40 MG tablet Take 1 tablet (40 mg total) by mouth 2 (two) times daily.   Potassium Chloride ER 20 MEQ TBCR Take 1 tablet by mouth daily.   potassium chloride SA (KLOR-CON M20) 20 MEQ tablet TAKE 1 TABLET ONCE A DAY ORALLY 30 DAYS (Patient taking differently: Take 20 mEq by mouth daily. )   pramipexole (MIRAPEX) 0.25 MG tablet Take 0.75 mg by mouth 2 (two) times daily. Supper and bedtime   Vitamin D, Ergocalciferol, (DRISDOL) 1.25 MG (50000 UNIT) CAPS capsule Take 1 capsule (50,000 Units total) by mouth 2 (two) times a week. Takes on Sunday's and Wednesday's   No facility-administered encounter medications on file as of 01/29/2020.    Review of Systems  Constitutional: Negative for chills and fever.  Eyes: Negative for visual disturbance.  Respiratory: Negative for chest tightness and shortness of breath.   Cardiovascular: Negative for chest pain and leg swelling.  Gastrointestinal: Negative for abdominal pain.  Genitourinary: Positive for dysuria, frequency and urgency. Negative for difficulty urinating, flank pain, hematuria, vaginal bleeding, vaginal discharge and vaginal pain.  Musculoskeletal: Negative for back pain and gait problem.  Skin: Negative for rash.  Neurological: Negative for light-headedness and headaches.  Psychiatric/Behavioral: Negative for agitation and behavioral problems.  All other systems reviewed and are negative.   Observations/Objective: Patient sounds comfortable and  in no acute distress.   Assessment and Plan: Problem List Items Addressed This Visit    None    Visit Diagnoses    UTI symptoms       Relevant Medications   cephALEXin (KEFLEX) 500 MG capsule      Patient's urinalysis shows 11-30 WBCs and 0-2 RBCs and 0-10 epithelial cells and few bacteria and nitrite positive and  1+ leukocytes and 2+ blood and 2+ protein and trace ketones.  Will treat like UTI and recommended to drink more fluids. Follow up plan: Return if symptoms worsen or fail to improve.     I discussed the assessment and treatment plan with the patient. The patient was provided an opportunity to ask questions and all were answered. The patient agreed with the plan and demonstrated an understanding of the instructions.   The patient was advised to call back or seek an in-person evaluation if the symptoms worsen or if the condition fails to improve as anticipated.  The above assessment and management plan was discussed with the patient. The patient verbalized understanding of and has agreed to the management plan. Patient is aware to call the clinic if symptoms persist or worsen. Patient is aware when to return to the clinic for a follow-up visit. Patient educated on when it is appropriate to go to the emergency department.    I provided 5 minutes of non-face-to-face time during this encounter.    Nils Pyle, MD

## 2020-02-01 LAB — URINE CULTURE

## 2020-02-04 ENCOUNTER — Other Ambulatory Visit: Payer: Self-pay | Admitting: Family Medicine

## 2020-02-06 MED ORDER — POTASSIUM CHLORIDE ER 20 MEQ PO TBCR: 1.0000 | EXTENDED_RELEASE_TABLET | Freq: Every day | ORAL | 0 refills | Status: DC

## 2020-02-11 ENCOUNTER — Other Ambulatory Visit: Payer: Self-pay | Admitting: Family Medicine

## 2020-02-11 MED ORDER — POTASSIUM CHLORIDE ER 20 MEQ PO TBCR: 1.0000 | EXTENDED_RELEASE_TABLET | Freq: Every day | ORAL | 0 refills | Status: DC

## 2020-02-20 DIAGNOSIS — H5213 Myopia, bilateral: Secondary | ICD-10-CM | POA: Diagnosis not present

## 2020-03-09 ENCOUNTER — Ambulatory Visit: Payer: BC Managed Care – PPO | Admitting: Family Medicine

## 2020-03-09 ENCOUNTER — Other Ambulatory Visit: Payer: Self-pay

## 2020-03-09 ENCOUNTER — Encounter: Payer: Self-pay | Admitting: Family Medicine

## 2020-03-09 VITALS — BP 105/72 | HR 60 | Temp 98.4°F | Ht 60.0 in | Wt 234.0 lb

## 2020-03-09 DIAGNOSIS — E876 Hypokalemia: Secondary | ICD-10-CM

## 2020-03-09 DIAGNOSIS — G8929 Other chronic pain: Secondary | ICD-10-CM

## 2020-03-09 DIAGNOSIS — E559 Vitamin D deficiency, unspecified: Secondary | ICD-10-CM

## 2020-03-09 DIAGNOSIS — Z23 Encounter for immunization: Secondary | ICD-10-CM | POA: Diagnosis not present

## 2020-03-09 DIAGNOSIS — M25562 Pain in left knee: Secondary | ICD-10-CM | POA: Diagnosis not present

## 2020-03-09 DIAGNOSIS — E7800 Pure hypercholesterolemia, unspecified: Secondary | ICD-10-CM

## 2020-03-09 DIAGNOSIS — M47816 Spondylosis without myelopathy or radiculopathy, lumbar region: Secondary | ICD-10-CM | POA: Diagnosis not present

## 2020-03-09 MED ORDER — ACETAMINOPHEN-CODEINE #3 300-30 MG PO TABS: 1.0000 | ORAL_TABLET | Freq: Three times a day (TID) | ORAL | 1 refills | Status: DC | PRN

## 2020-03-09 NOTE — Patient Instructions (Signed)

## 2020-03-09 NOTE — Progress Notes (Signed)
Subjective: CC: f/u chronic pain PCP: Raliegh Ip, DO OFB:PZWCH Darlene [name] is a 49 y.o. female presenting to clinic today for:  1. Chronic pain History: chronic left knee pain status post surgical repair 15 years ago.    Continues to have medial left knee pain and back pain.  The shot was helpful for about 3-4 weeks but wore off after that.  She has been using her tylenol with codeine more regularly as a result.  Denies uncontrolled constipation, falls, respiratory depression, visual/ auditory hallucination.  She is ready to see an orthopedist and requests [place].  2. Hypokalemia Patient uses KlorCon for potassium.  She has some cramping of the legs and would like to get this checked today.    ROS: Per HPI  No Known Allergies Past Medical History:  Diagnosis Date   Fatty liver    Fatty liver    GERD (gastroesophageal reflux disease)    Hiatal hernia    Hypertension    Hypokalemia    OA (osteoarthritis)    Plantar fasciitis    Vitamin D deficiency     Current Outpatient Medications:    acetaminophen-codeine (TYLENOL #3) 300-30 MG tablet, Take 1 tablet by mouth every 8 (eight) hours as needed for moderate pain or severe pain., Disp: 90 tablet, Rfl: 1   albuterol (PROAIR HFA) 108 (90 Base) MCG/ACT inhaler, Inhale 2 puffs into the lungs every 4 (four) hours as needed for wheezing or shortness of breath., Disp: 6.7 g, Rfl: 0   cephALEXin (KEFLEX) 500 MG capsule, Take 1 capsule (500 mg total) by mouth 4 (four) times daily., Disp: 28 capsule, Rfl: 0   citalopram (CELEXA) 20 MG tablet, Take 1 tablet (20 mg total) by mouth daily., Disp: 90 tablet, Rfl: 0   cyclobenzaprine (FLEXERIL) 10 MG tablet, Take 1 tablet (10 mg total) by mouth 3 (three) times daily as needed for muscle spasms., Disp: 30 tablet, Rfl: 2   gabapentin (NEURONTIN) 100 MG capsule, Take 1-6 capsules (100-600 mg total) by mouth 3 (three) times daily., Disp: 180 capsule, Rfl: 3   hydrochlorothiazide  (HYDRODIURIL) 25 MG tablet, Take 1 tablet (25 mg total) by mouth daily with breakfast. (Needs to be seen before next refill), Disp: 90 tablet, Rfl: 3   ibuprofen (ADVIL) 800 MG tablet, Take 1 tablet (800 mg total) by mouth 3 (three) times daily., Disp: 270 tablet, Rfl: 0   loratadine (CLARITIN) 10 MG tablet, TAKE 1 TABLET BY MOUTH EVERY DAY FOR ALLERGIES, Disp: 90 tablet, Rfl: 1   metoprolol succinate (TOPROL-XL) 25 MG 24 hr tablet, Take 1 tablet (25 mg total) by mouth daily., Disp: 90 tablet, Rfl: 0   pantoprazole (PROTONIX) 40 MG tablet, Take 1 tablet (40 mg total) by mouth 2 (two) times daily., Disp: 180 tablet, Rfl: 0   Potassium Chloride ER 20 MEQ TBCR, Take 1 tablet by mouth daily., Disp: 90 tablet, Rfl: 0   potassium chloride SA (KLOR-CON M20) 20 MEQ tablet, TAKE 1 TABLET ONCE A DAY ORALLY 30 DAYS (Patient taking differently: Take 20 mEq by mouth daily. ), Disp: 90 tablet, Rfl: 3   pramipexole (MIRAPEX) 0.25 MG tablet, Take 0.75 mg by mouth 2 (two) times daily. Supper and bedtime, Disp: , Rfl:    Vitamin D, Ergocalciferol, (DRISDOL) 1.25 MG (50000 UNIT) CAPS capsule, Take 1 capsule (50,000 Units total) by mouth 2 (two) times a week. Takes on Sunday's and Wednesday's, Disp: 24 capsule, Rfl: 1 Social History   Socioeconomic History   Marital  status: Married    Spouse name: Not on file   Number of children: 3   Years of education: Not on file   Highest education level: Not on file  Occupational History   Occupation: TEXTILE  Tobacco Use   Smoking status: Never Smoker   Smokeless tobacco: Never Used  Vaping Use   Vaping Use: Never used  Substance and Sexual Activity   Alcohol use: No    Alcohol/week: 0.0 standard drinks   Drug use: No   Sexual activity: Not on file  Other Topics Concern   Not on file  Social History Narrative   Not on file   Social Determinants of Health   Financial Resource Strain:    Difficulty of Paying Living Expenses: Not on file    Food Insecurity:    Worried About Running Out of Food in the Last Year: Not on file   Ran Out of Food in the Last Year: Not on file  Transportation Needs:    Lack of Transportation (Medical): Not on file   Lack of Transportation (Non-Medical): Not on file  Physical Activity:    Days of Exercise per Week: Not on file   Minutes of Exercise per Session: Not on file  Stress:    Feeling of Stress : Not on file  Social Connections:    Frequency of Communication with Friends and Family: Not on file   Frequency of Social Gatherings with Friends and Family: Not on file   Attends Religious Services: Not on file   Active Member of Clubs or Organizations: Not on file   Attends Banker Meetings: Not on file   Marital Status: Not on file  Intimate Partner Violence:    Fear of Current or Ex-Partner: Not on file   Emotionally Abused: Not on file   Physically Abused: Not on file   Sexually Abused: Not on file   Family History  Problem Relation Age of Onset   High blood pressure Mother    Diabetes Mother    Kidney cancer Mother    Hyperlipidemia Sister    Heart disease Sister    Heart disease Brother    Hyperlipidemia Brother    Colon cancer Neg Hx    Rectal cancer Neg Hx    Stomach cancer Neg Hx     Objective: Office vital signs reviewed. BP 105/72    Pulse 60    Temp 98.4 F (36.9 C)    Ht 5' (1.524 m)    Wt 234 lb (106.1 kg)    LMP 02/17/2020    SpO2 96%    BMI 45.70 kg/m   Physical Examination:  General: Awake, alert, well appearing, obese, No acute distress HEENT: Normal, sclera white, MMM.  Cardio: RRR, S1S2, no murmurs Pulm: CTAB, normal work of breahting on room air Extremities: warm, well perfused, No edema, cyanosis or clubbing; +2 pulses bilaterally MSK: normal gait and station, ambulating independently  Assessment/ Plan: 49 y.o. female   1. Spondylosis of lumbar region without myelopathy or radiculopathy Not well controlled.  Refer to ortho. The Narcotic Database has been reviewed.  There were no red flags.   - acetaminophen-codeine (TYLENOL #3) 300-30 MG tablet; Take 1 tablet by mouth every 8 (eight) hours as needed for moderate pain or severe pain.  Dispense: 90 tablet; Refill: 1 - Ambulatory referral to Orthopedic Surgery  2. Chronic pain of left knee Not controlled, relying on Tylenol #3 more regularly.  Would like reevaluation with ortho.   -  acetaminophen-codeine (TYLENOL #3) 300-30 MG tablet; Take 1 tablet by mouth every 8 (eight) hours as needed for moderate pain or severe pain.  Dispense: 90 tablet; Refill: 1 - Ambulatory referral to Orthopedic Surgery  3. Hypokalemia Check potassium/ magnesium given cramping - Basic Metabolic Panel - Magnesium  4. Vitamin D deficiency - VITAMIN D 25 Hydroxy (Vit-D Deficiency, Fractures)  5. Pure hypercholesterolemia - LDL Cholesterol, Direct  6. Need for immunization against influenza Administered. - Flu Vaccine QUAD 36+ mos IM    No orders of the defined types were placed in this encounter.  No orders of the defined types were placed in this encounter.    Raliegh Ip, DO [facility] [phone]

## 2020-03-10 LAB — BASIC METABOLIC PANEL
BUN/Creatinine Ratio: 15 (ref 9–23)
BUN: 13 mg/dL (ref 6–24)
CO2: 30 mmol/L — ABNORMAL HIGH (ref 20–29)
Calcium: 9.2 mg/dL (ref 8.7–10.2)
Chloride: 100 mmol/L (ref 96–106)
Creatinine, Ser: 0.89 mg/dL (ref 0.57–1.00)
GFR calc Af Amer: 89 mL/min/{1.73_m2} (ref >59)
GFR calc non Af Amer: 77 mL/min/{1.73_m2} (ref >59)
Glucose: 88 mg/dL (ref 65–99)
Potassium: 3.4 mmol/L — ABNORMAL LOW (ref 3.5–5.2)
Sodium: 141 mmol/L (ref 134–144)

## 2020-03-10 LAB — VITAMIN D 25 HYDROXY (VIT D DEFICIENCY, FRACTURES): Vit D, 25-Hydroxy: 46.1 ng/mL (ref 30.0–100.0)

## 2020-03-10 LAB — LDL CHOLESTEROL, DIRECT: LDL Direct: 109 mg/dL — ABNORMAL HIGH (ref 0–99)

## 2020-03-10 LAB — MAGNESIUM: Magnesium: 2.1 mg/dL (ref 1.6–2.3)

## 2020-03-13 ENCOUNTER — Other Ambulatory Visit: Payer: Self-pay | Admitting: Family Medicine

## 2020-03-13 DIAGNOSIS — I471 Supraventricular tachycardia, unspecified: Secondary | ICD-10-CM

## 2020-03-23 ENCOUNTER — Other Ambulatory Visit: Payer: Self-pay | Admitting: Family Medicine

## 2020-03-23 ENCOUNTER — Encounter: Payer: Self-pay | Admitting: Family Medicine

## 2020-03-23 DIAGNOSIS — F411 Generalized anxiety disorder: Secondary | ICD-10-CM

## 2020-03-30 ENCOUNTER — Encounter: Payer: Self-pay | Admitting: Family Medicine

## 2020-04-01 DIAGNOSIS — M1712 Unilateral primary osteoarthritis, left knee: Secondary | ICD-10-CM | POA: Diagnosis not present

## 2020-04-01 DIAGNOSIS — M25562 Pain in left knee: Secondary | ICD-10-CM | POA: Diagnosis not present

## 2020-04-11 ENCOUNTER — Other Ambulatory Visit: Payer: Self-pay | Admitting: Family Medicine

## 2020-04-11 DIAGNOSIS — I471 Supraventricular tachycardia: Secondary | ICD-10-CM

## 2020-04-27 ENCOUNTER — Encounter: Payer: Self-pay | Admitting: Family Medicine

## 2020-05-05 DIAGNOSIS — M25562 Pain in left knee: Secondary | ICD-10-CM | POA: Diagnosis not present

## 2020-05-17 ENCOUNTER — Other Ambulatory Visit: Payer: Self-pay | Admitting: Family Medicine

## 2020-05-17 DIAGNOSIS — I471 Supraventricular tachycardia: Secondary | ICD-10-CM

## 2020-05-17 MED ORDER — IBUPROFEN 800 MG PO TABS
800.0000 mg | ORAL_TABLET | Freq: Three times a day (TID) | ORAL | 0 refills | Status: DC
Start: 2020-05-17 — End: 2020-12-15

## 2020-05-17 MED ORDER — METOPROLOL SUCCINATE ER 25 MG PO TB24: 25.0000 mg | ORAL_TABLET | Freq: Every day | ORAL | 0 refills | Status: DC

## 2020-05-17 MED ORDER — POTASSIUM CHLORIDE ER 20 MEQ PO TBCR: 1.0000 | EXTENDED_RELEASE_TABLET | Freq: Every day | ORAL | 0 refills | Status: DC

## 2020-06-07 ENCOUNTER — Encounter: Payer: Self-pay | Admitting: Family Medicine

## 2020-06-09 ENCOUNTER — Ambulatory Visit: Payer: BC Managed Care – PPO | Admitting: Family Medicine

## 2020-06-09 ENCOUNTER — Other Ambulatory Visit: Payer: Self-pay

## 2020-06-09 VITALS — BP 101/72 | HR 72 | Temp 98.1°F | Ht 60.0 in | Wt 241.0 lb

## 2020-06-09 DIAGNOSIS — M47816 Spondylosis without myelopathy or radiculopathy, lumbar region: Secondary | ICD-10-CM | POA: Diagnosis not present

## 2020-06-09 DIAGNOSIS — M25562 Pain in left knee: Secondary | ICD-10-CM | POA: Diagnosis not present

## 2020-06-09 DIAGNOSIS — G8929 Other chronic pain: Secondary | ICD-10-CM | POA: Diagnosis not present

## 2020-06-09 DIAGNOSIS — F1190 Opioid use, unspecified, uncomplicated: Secondary | ICD-10-CM | POA: Diagnosis not present

## 2020-06-09 MED ORDER — ACETAMINOPHEN-CODEINE #3 300-30 MG PO TABS: 1.0000 | ORAL_TABLET | Freq: Three times a day (TID) | ORAL | 1 refills | Status: DC | PRN

## 2020-06-09 MED ORDER — GABAPENTIN 100 MG PO CAPS: 100.0000 mg | ORAL_CAPSULE | Freq: Two times a day (BID) | ORAL | 3 refills | Status: DC

## 2020-06-09 NOTE — Progress Notes (Signed)
Subjective: CC: chronic pain management PCP: Raliegh Ip, DO WEX:HBZJI Darlene [name] is a 50 y.o. female presenting to clinic today for:  1. Chronic pain Patient here for interval follow-up on chronic pain.  She continues to have fairly stable lumbar pain.  She takes Tylenol 3 twice daily for this fairly regularly.  Denies any excessive daytime sedation, falls or respiratory depression.  No constipation.  She uses gabapentin 100 mg twice daily.  This is the max tolerated dose secondary excessive sedation with higher doses or frequencies.  She takes Motrin on average once per week because more frequent use causes increased reflux symptoms despite use of PPI.  She saw Dr. Jillyn Hidden recently and had a corticosteroid injection of her knee.  Follow-up showed that symptoms seem better but not totally resolved.  There is certainly more tolerable and therefore MRI of the knee was not pursued.  She is to follow-up with him on a as needed basis.  She notes that low back issues were not really discussed at that visit.   ROS: Per HPI  No Known Allergies Past Medical History:  Diagnosis Date  . Fatty liver   . Fatty liver   . GERD (gastroesophageal reflux disease)   . Hiatal hernia   . Hypertension   . Hypokalemia   . OA (osteoarthritis)   . Plantar fasciitis   . Vitamin D deficiency     Current Outpatient Medications:  .  acetaminophen-codeine (TYLENOL #3) 300-30 MG tablet, Take 1 tablet by mouth every 8 (eight) hours as needed for moderate pain or severe pain., Disp: 90 tablet, Rfl: 1 .  citalopram (CELEXA) 20 MG tablet, TAKE 1 TABLET BY MOUTH EVERY DAY, Disp: 90 tablet, Rfl: 3 .  cyclobenzaprine (FLEXERIL) 10 MG tablet, Take 1 tablet (10 mg total) by mouth 3 (three) times daily as needed for muscle spasms., Disp: 30 tablet, Rfl: 2 .  gabapentin (NEURONTIN) 100 MG capsule, Take 1-6 capsules (100-600 mg total) by mouth 3 (three) times daily., Disp: 180 capsule, Rfl: 3 .  hydrochlorothiazide  (HYDRODIURIL) 25 MG tablet, Take 1 tablet (25 mg total) by mouth daily with breakfast. (Needs to be seen before next refill), Disp: 90 tablet, Rfl: 3 .  ibuprofen (ADVIL) 800 MG tablet, Take 1 tablet (800 mg total) by mouth 3 (three) times daily., Disp: 270 tablet, Rfl: 0 .  loratadine (CLARITIN) 10 MG tablet, TAKE 1 TABLET BY MOUTH EVERY DAY FOR ALLERGIES, Disp: 90 tablet, Rfl: 1 .  metoprolol succinate (TOPROL-XL) 25 MG 24 hr tablet, Take 1 tablet (25 mg total) by mouth daily., Disp: 90 tablet, Rfl: 0 .  pantoprazole (PROTONIX) 40 MG tablet, Take 1 tablet (40 mg total) by mouth 2 (two) times daily., Disp: 180 tablet, Rfl: 0 .  Potassium Chloride ER 20 MEQ TBCR, Take 1 tablet by mouth daily., Disp: 90 tablet, Rfl: 0 .  pramipexole (MIRAPEX) 0.25 MG tablet, Take 0.75 mg by mouth 2 (two) times daily. Supper and bedtime, Disp: , Rfl:  .  albuterol (PROAIR HFA) 108 (90 Base) MCG/ACT inhaler, Inhale 2 puffs into the lungs every 4 (four) hours as needed for wheezing or shortness of breath. (Patient not taking: Reported on 06/09/2020), Disp: 6.7 g, Rfl: 0 Social History   Socioeconomic History  . Marital status: Married    Spouse name: Not on file  . Number of children: 3  . Years of education: Not on file  . Highest education level: Not on file  Occupational History  .  Occupation: TEXTILE  Tobacco Use  . Smoking status: Never Smoker  . Smokeless tobacco: Never Used  Vaping Use  . Vaping Use: Never used  Substance and Sexual Activity  . Alcohol use: No    Alcohol/week: 0.0 standard drinks  . Drug use: No  . Sexual activity: Not on file  Other Topics Concern  . Not on file  Social History Narrative  . Not on file   Social Determinants of Health   Financial Resource Strain: Not on file  Food Insecurity: Not on file  Transportation Needs: Not on file  Physical Activity: Not on file  Stress: Not on file  Social Connections: Not on file  Intimate Partner Violence: Not on file   Family  History  Problem Relation Age of Onset  . High blood pressure Mother   . Diabetes Mother   . Kidney cancer Mother   . Hyperlipidemia Sister   . Heart disease Sister   . Heart disease Brother   . Hyperlipidemia Brother   . Colon cancer Neg Hx   . Rectal cancer Neg Hx   . Stomach cancer Neg Hx     Objective: Office vital signs reviewed. BP 101/72   Pulse 72   Temp 98.1 F (36.7 C) (Temporal)   Ht 5' (1.524 m)   Wt 241 lb (109.3 kg)   LMP 05/19/2020 (Exact Date)   SpO2 93%   BMI 47.07 kg/m   Physical Examination:  General: Awake, alert, well nourished, No acute distress HEENT: Normal, sclera white Cardio: regular rate and rhythm, S1S2 heard, no murmurs appreciated Pulm: clear to auscultation bilaterally, no wheezes, rhonchi or rales; normal work of breathing on room air MSK: Ambulating independently.  Normal tone.  No tenderness palpation along the midline of back.  No palpable bony abnormalities.  She does have increased lordosis of the lumbar spine Neuro: no tremor  Assessment/ Plan: 50 y.o. female   Spondylosis of lumbar region without myelopathy or radiculopathy - Plan: acetaminophen-codeine (TYLENOL #3) 300-30 MG tablet, gabapentin (NEURONTIN) 100 MG capsule  Chronic pain of left knee - Plan: acetaminophen-codeine (TYLENOL #3) 300-30 MG tablet  Chronic, continuous use of opioids  Symptoms are stable with regards to her lumbar pain.  She continues to take the medication typically twice daily and this has been renewed.  The national narcotic database was reviewed and there were no red flags.  She is up-to-date on CSA and UDS.  Her name seems to be getting better and she is less reliant on the Tylenol 3 for that reason.  Okay to continue NSAID as needed.  Continue to follow-up with orthopedics for ongoing management.  Possible MRI in future if symptoms return despite use of corticosteroid shots  Gabapentin was renewed and the medication was changed to reflect current  usage.  Max tolerated dose is 100 mg twice daily.  Celexa was requested but she had this sent in November for 1 year.  No orders of the defined types were placed in this encounter.  No orders of the defined types were placed in this encounter.    Raliegh Ip, DO [facility] [phone]

## 2020-06-15 ENCOUNTER — Other Ambulatory Visit: Payer: Self-pay | Admitting: Family Medicine

## 2020-06-15 ENCOUNTER — Encounter: Payer: Self-pay | Admitting: Family Medicine

## 2020-06-15 DIAGNOSIS — I471 Supraventricular tachycardia, unspecified: Secondary | ICD-10-CM

## 2020-06-15 DIAGNOSIS — F411 Generalized anxiety disorder: Secondary | ICD-10-CM

## 2020-06-15 MED ORDER — CITALOPRAM HYDROBROMIDE 40 MG PO TABS: 40.0000 mg | ORAL_TABLET | Freq: Every day | ORAL | 3 refills | Status: DC

## 2020-06-15 MED ORDER — METOPROLOL SUCCINATE ER 25 MG PO TB24: 25.0000 mg | ORAL_TABLET | Freq: Every day | ORAL | 0 refills | Status: DC

## 2020-07-06 ENCOUNTER — Encounter: Payer: Self-pay | Admitting: Family Medicine

## 2020-08-17 ENCOUNTER — Encounter: Payer: Self-pay | Admitting: Family Medicine

## 2020-08-18 ENCOUNTER — Other Ambulatory Visit: Payer: Self-pay | Admitting: Family Medicine

## 2020-08-18 ENCOUNTER — Encounter: Payer: Self-pay | Admitting: Family Medicine

## 2020-08-18 ENCOUNTER — Telehealth: Payer: BC Managed Care – PPO | Admitting: Family Medicine

## 2020-08-18 DIAGNOSIS — J208 Acute bronchitis due to other specified organisms: Secondary | ICD-10-CM

## 2020-08-18 DIAGNOSIS — B9689 Other specified bacterial agents as the cause of diseases classified elsewhere: Secondary | ICD-10-CM

## 2020-08-18 MED ORDER — PREDNISONE 20 MG PO TABS: 40.0000 mg | ORAL_TABLET | Freq: Every day | ORAL | 0 refills | Status: DC

## 2020-08-18 MED ORDER — AZITHROMYCIN 250 MG PO TABS: ORAL_TABLET | ORAL | 0 refills | Status: DC

## 2020-08-18 MED ORDER — ALBUTEROL SULFATE HFA 108 (90 BASE) MCG/ACT IN AERS: 2.0000 | INHALATION_SPRAY | RESPIRATORY_TRACT | 0 refills | Status: DC | PRN

## 2020-08-18 MED ORDER — PROMETHAZINE-DM 6.25-15 MG/5ML PO SYRP: 2.5000 mL | ORAL_SOLUTION | Freq: Four times a day (QID) | ORAL | 0 refills | Status: DC | PRN

## 2020-08-18 NOTE — Progress Notes (Signed)
MyChart Video visit  Subjective: CC: cough PCP: Raliegh Ip, DO AOZ:HYQMV Darlene [name] is a 50 y.o. female. Patient provides verbal consent for consult held via video.  Due to COVID-19 pandemic this visit was conducted virtually. This visit type was conducted due to national recommendations for restrictions regarding the COVID-19 Pandemic (e.g. social distancing, sheltering in place) in an effort to limit this patient's exposure and mitigate transmission in our community. All issues noted in this document were discussed and addressed.  A physical exam was not performed with this format.   Location of patient: home  Location of provider: [facility] Others present for call: spouse  1. Cough She reports harsh cough that onset 4 days ago and it is getting worse.  She reports shortness of breath, wheeze.  No fevers.  Cough is non productive.  She is taking Mucinex, allergy medication and Nyquil.  She called [facility]edoc 4 days ago and was placed on Augmentin but it has not helped and is giving diarrhea.  She is a nonsmoker.  She has a history of bronchospasm but no formal diagnosis of asthma or respiratory disease.  At home COVID test negative. No known contacts with COVID or flu.  She has an old albuterol inhaler and she is been using that intermittently but she is not sure that it is not expired.  ROS: Per HPI  No Known Allergies Past Medical History:  Diagnosis Date  . Fatty liver   . Fatty liver   . GERD (gastroesophageal reflux disease)   . Hiatal hernia   . Hypertension   . Hypokalemia   . OA (osteoarthritis)   . Plantar fasciitis   . Vitamin D deficiency     Current Outpatient Medications:  .  acetaminophen-codeine (TYLENOL #3) 300-30 MG tablet, Take 1 tablet by mouth every 8 (eight) hours as needed for moderate pain or severe pain., Disp: 90 tablet, Rfl: 1 .  albuterol (PROAIR HFA) 108 (90 Base) MCG/ACT inhaler, Inhale 2 puffs into the lungs every 4 (four) hours as needed for wheezing  or shortness of breath. (Patient not taking: Reported on 06/09/2020), Disp: 6.7 g, Rfl: 0 .  citalopram (CELEXA) 40 MG tablet, Take 1 tablet (40 mg total) by mouth daily., Disp: 90 tablet, Rfl: 3 .  cyclobenzaprine (FLEXERIL) 10 MG tablet, Take 1 tablet (10 mg total) by mouth 3 (three) times daily as needed for muscle spasms., Disp: 30 tablet, Rfl: 2 .  gabapentin (NEURONTIN) 100 MG capsule, Take 1 capsule (100 mg total) by mouth 2 (two) times daily., Disp: 180 capsule, Rfl: 3 .  hydrochlorothiazide (HYDRODIURIL) 25 MG tablet, Take 1 tablet (25 mg total) by mouth daily with breakfast. (Needs to be seen before next refill), Disp: 90 tablet, Rfl: 3 .  ibuprofen (ADVIL) 800 MG tablet, Take 1 tablet (800 mg total) by mouth 3 (three) times daily., Disp: 270 tablet, Rfl: 0 .  loratadine (CLARITIN) 10 MG tablet, TAKE 1 TABLET BY MOUTH EVERY DAY FOR ALLERGIES, Disp: 90 tablet, Rfl: 1 .  metoprolol succinate (TOPROL-XL) 25 MG 24 hr tablet, Take 1 tablet (25 mg total) by mouth daily., Disp: 90 tablet, Rfl: 0 .  pantoprazole (PROTONIX) 40 MG tablet, Take 1 tablet (40 mg total) by mouth 2 (two) times daily., Disp: 180 tablet, Rfl: 0 .  Potassium Chloride ER 20 MEQ TBCR, Take 1 tablet by mouth daily., Disp: 90 tablet, Rfl: 0 .  pramipexole (MIRAPEX) 0.25 MG tablet, Take 0.75 mg by mouth 2 (two) times daily. Supper  and bedtime, Disp: , Rfl:   Gen: Nontoxic female coughing intermittently during video visit Pulmonary: Nonlabored breathing but again coughing as above  Assessment/ Plan: 50 y.o. female   Acute bacterial bronchitis - Plan: albuterol (PROAIR HFA) 108 (90 Base) MCG/ACT inhaler, azithromycin (ZITHROMAX) 250 MG tablet, predniSONE (DELTASONE) 20 MG tablet, promethazine-dextromethorphan (PROMETHAZINE-DM) 6.25-15 MG/5ML syrup  Going to treat her as an acute bacterial bronchitis given worsening of symptoms despite use of Augmentin, albuterol inhaler.  I placed on prednisone for the next 3 days given her  Promethazine DM, given her new albuterol inhaler instructed her to use this consistently for the next 48 hours whilst her body has time to respond to the new antibiotic and steroid.  Z-Pak sent to cover for any pulmonary infection including community-acquired pneumonia.  We discussed red flag signs and symptoms warranting further evaluation.  She voiced good understanding of the plan and will follow up as needed  Start time: 1:27pm End time: 1:33pm  Total time spent on patient care (including video visit/ documentation): 6 minutes  Annebelle Bostic Hulen Skains, DO [facility] [phone]

## 2020-08-23 ENCOUNTER — Encounter: Payer: Self-pay | Admitting: Family Medicine

## 2020-08-24 ENCOUNTER — Telehealth: Payer: Self-pay

## 2020-08-24 ENCOUNTER — Other Ambulatory Visit: Payer: Self-pay

## 2020-08-24 MED ORDER — FLUCONAZOLE 150 MG PO TABS: ORAL_TABLET | ORAL | 0 refills | Status: DC

## 2020-08-24 NOTE — Telephone Encounter (Signed)
Pt had an appt with Dr Nadine Counts on 08/18/20 and was prescribed some antibiotics to take, which is what is causing her to have a yeast infection. Needs medicine called in to [facility] in [place].

## 2020-08-24 NOTE — Telephone Encounter (Signed)
Pt returned missed call regarding PA for Saxenda.. pt says she does not take Saxenda and hasn't for years. Please disregard request. (see previous telephone message)

## 2020-08-24 NOTE — Telephone Encounter (Signed)
Cell number was not working  [employer] home number and LM

## 2020-08-24 NOTE — Telephone Encounter (Signed)
She hasn't been prescribed that med since 2020 when she was with Prudy Feeler.   I didn't realize she was still being prescribed.  Where is med coming from?  I've never rx'd that med.

## 2020-08-24 NOTE — Telephone Encounter (Signed)
Note added to previous phone call.

## 2020-08-24 NOTE — Telephone Encounter (Signed)
Milas Kocher routed conversation to [employer] Prior Auth 21 minutes ago (1:48 PM)   Benna Dunks, [place] 21 minutes ago (1:48 PM)   CB    Pt returned missed call regarding PA for Saxenda.. pt says she does not take Saxenda and hasn't for years. Please disregard request. (see previous telephone message)

## 2020-08-24 NOTE — Telephone Encounter (Signed)
Received a prior auth for Saxenda  Not on current medication list Pleas advise if you would like PA started   KEY- [id number]

## 2020-08-26 DIAGNOSIS — L03031 Cellulitis of right toe: Secondary | ICD-10-CM | POA: Diagnosis not present

## 2020-08-26 DIAGNOSIS — L03329 Acute lymphangitis of trunk, unspecified: Secondary | ICD-10-CM | POA: Diagnosis not present

## 2020-09-07 ENCOUNTER — Encounter: Payer: Self-pay | Admitting: Family Medicine

## 2020-09-07 ENCOUNTER — Other Ambulatory Visit: Payer: Self-pay

## 2020-09-07 ENCOUNTER — Ambulatory Visit: Payer: BC Managed Care – PPO | Admitting: Family Medicine

## 2020-09-07 VITALS — BP 109/67 | HR 77 | Temp 98.1°F | Ht 60.0 in | Wt 245.4 lb

## 2020-09-07 DIAGNOSIS — G5601 Carpal tunnel syndrome, right upper limb: Secondary | ICD-10-CM

## 2020-09-07 DIAGNOSIS — E876 Hypokalemia: Secondary | ICD-10-CM

## 2020-09-07 DIAGNOSIS — Z79899 Other long term (current) drug therapy: Secondary | ICD-10-CM | POA: Diagnosis not present

## 2020-09-07 DIAGNOSIS — M25562 Pain in left knee: Secondary | ICD-10-CM | POA: Diagnosis not present

## 2020-09-07 DIAGNOSIS — F1190 Opioid use, unspecified, uncomplicated: Secondary | ICD-10-CM | POA: Diagnosis not present

## 2020-09-07 DIAGNOSIS — M47816 Spondylosis without myelopathy or radiculopathy, lumbar region: Secondary | ICD-10-CM

## 2020-09-07 DIAGNOSIS — G8929 Other chronic pain: Secondary | ICD-10-CM

## 2020-09-07 DIAGNOSIS — Z1211 Encounter for screening for malignant neoplasm of colon: Secondary | ICD-10-CM

## 2020-09-07 MED ORDER — ACETAMINOPHEN-CODEINE #3 300-30 MG PO TABS: 1.0000 | ORAL_TABLET | Freq: Three times a day (TID) | ORAL | 1 refills | Status: DC | PRN

## 2020-09-07 NOTE — Progress Notes (Signed)
Subjective: CC: chronic pain management 3 month f/u PCP: Raliegh Ip, DO ZOX:WRUEA Darlene [name] is a 50 y.o. female presenting to clinic today for:  1. Chronic pain/carpal tunnel syndrome Patient reports chronic pain has been stable.  She is taking Advil as needed but notes that this is only a couple times per month.  She uses Tylenol 3 with last dose about 2 to 3 weeks ago.  She is also on gabapentin 100 mg twice daily with an occasional extra capsule at nighttime if needed.  Denies any excessive daytime sedation, falls, respiratory depression, visual or auditory hallucinations.  She does admit to some numbness and tingling in her right hand.  She is right-hand dominant.  She wears a brace and this sometimes helps but unfortunately symptoms have not let up.   ROS: Per HPI  No Known Allergies Past Medical History:  Diagnosis Date  . Fatty liver   . Fatty liver   . GERD (gastroesophageal reflux disease)   . Hiatal hernia   . Hypertension   . Hypokalemia   . OA (osteoarthritis)   . Plantar fasciitis   . Vitamin D deficiency     Current Outpatient Medications:  .  acetaminophen-codeine (TYLENOL #3) 300-30 MG tablet, Take 1 tablet by mouth every 8 (eight) hours as needed for moderate pain or severe pain., Disp: 90 tablet, Rfl: 1 .  albuterol (PROAIR HFA) 108 (90 Base) MCG/ACT inhaler, Inhale 2 puffs into the lungs every 4 (four) hours as needed for wheezing or shortness of breath., Disp: 6.7 g, Rfl: 0 .  azithromycin (ZITHROMAX) 250 MG tablet, Take 2 tablets today, then take 1 tablet daily until gone., Disp: 6 tablet, Rfl: 0 .  citalopram (CELEXA) 40 MG tablet, Take 1 tablet (40 mg total) by mouth daily., Disp: 90 tablet, Rfl: 3 .  cyclobenzaprine (FLEXERIL) 10 MG tablet, Take 1 tablet (10 mg total) by mouth 3 (three) times daily as needed for muscle spasms., Disp: 30 tablet, Rfl: 2 .  fluconazole (DIFLUCAN) 150 MG tablet, Take one tablet today, repeat in 3 days PRN., Disp: 2  tablet, Rfl: 0 .  gabapentin (NEURONTIN) 100 MG capsule, Take 1 capsule (100 mg total) by mouth 2 (two) times daily., Disp: 180 capsule, Rfl: 3 .  hydrochlorothiazide (HYDRODIURIL) 25 MG tablet, TAKE 1 TABLET BY MOUTH DAILY WITH BREAKFAST. (NEEDS TO BE SEEN BEFORE NEXT REFILL), Disp: 30 tablet, Rfl: 0 .  ibuprofen (ADVIL) 800 MG tablet, Take 1 tablet (800 mg total) by mouth 3 (three) times daily., Disp: 270 tablet, Rfl: 0 .  loratadine (CLARITIN) 10 MG tablet, Take 1 tablet (10 mg total) by mouth daily. (NEEDS TO BE SEEN BEFORE NEXT REFILL), Disp: 90 tablet, Rfl: 0 .  metoprolol succinate (TOPROL-XL) 25 MG 24 hr tablet, Take 1 tablet (25 mg total) by mouth daily., Disp: 90 tablet, Rfl: 0 .  pantoprazole (PROTONIX) 40 MG tablet, Take 1 tablet (40 mg total) by mouth 2 (two) times daily., Disp: 180 tablet, Rfl: 0 .  Potassium Chloride ER 20 MEQ TBCR, Take 1 tablet by mouth daily., Disp: 90 tablet, Rfl: 0 .  pramipexole (MIRAPEX) 0.25 MG tablet, Take 0.75 mg by mouth 2 (two) times daily. Supper and bedtime, Disp: , Rfl:  .  predniSONE (DELTASONE) 20 MG tablet, Take 2 tablets (40 mg total) by mouth daily with breakfast., Disp: 3 tablet, Rfl: 0 .  promethazine-dextromethorphan (PROMETHAZINE-DM) 6.25-15 MG/5ML syrup, Take 2.5 mLs by mouth 4 (four) times daily as needed for cough.,  Disp: 118 mL, Rfl: 0 Social History   Socioeconomic History  . Marital status: Married    Spouse name: Not on file  . Number of children: 3  . Years of education: Not on file  . Highest education level: Not on file  Occupational History  . Occupation: TEXTILE  Tobacco Use  . Smoking status: Never Smoker  . Smokeless tobacco: Never Used  Vaping Use  . Vaping Use: Never used  Substance and Sexual Activity  . Alcohol use: No    Alcohol/week: 0.0 standard drinks  . Drug use: No  . Sexual activity: Not on file  Other Topics Concern  . Not on file  Social History Narrative  . Not on file   Social Determinants of  Health   Financial Resource Strain: Not on file  Food Insecurity: Not on file  Transportation Needs: Not on file  Physical Activity: Not on file  Stress: Not on file  Social Connections: Not on file  Intimate Partner Violence: Not on file   Family History  Problem Relation Age of Onset  . High blood pressure Mother   . Diabetes Mother   . Kidney cancer Mother   . Hyperlipidemia Sister   . Heart disease Sister   . Heart disease Brother   . Hyperlipidemia Brother   . Colon cancer Neg Hx   . Rectal cancer Neg Hx   . Stomach cancer Neg Hx     Objective: Office vital signs reviewed. BP 109/67   Pulse 77   Temp 98.1 F (36.7 C)   Ht 5' (1.524 m)   Wt 245 lb 6.4 oz (111.3 kg)   LMP 08/25/2020   SpO2 98%   BMI 47.93 kg/m   Physical Examination:  General: Awake, alert, well nourished, No acute distress Cardio: regular rate and rhythm, S1S2 heard, no murmurs appreciated Pulm: clear to auscultation bilaterally, no wheezes, rhonchi or rales; normal work of breathing on room air MSK: Symptoms reproduced with Phalen's.  Negative Tinel's.  No bony abnormalities appreciated.  No soft tissue swelling.  Patient is ambulating independently with normal tone  Assessment/ Plan: 50 y.o. female   Spondylosis of lumbar region without myelopathy or radiculopathy - Plan: Drug Screen 10 W/Conf, Se, acetaminophen-codeine (TYLENOL #3) 300-30 MG tablet  Chronic pain of left knee - Plan: Drug Screen 10 W/Conf, Se, acetaminophen-codeine (TYLENOL #3) 300-30 MG tablet  Chronic, continuous use of opioids - Plan: Drug Screen 10 W/Conf, Se  Controlled substance agreement signed  Hypokalemia - Plan: Basic Metabolic Panel  Carpal tunnel syndrome on right - Plan: Ambulatory referral to Orthopedic Surgery  Unfortunate patient was unable to use the restroom today so we obtained her drug screen via blood sample.  Chronic pain is stable.  She is using the Tylenol 3 intermittently and still has some  leftover but will renew it so that she has sufficient supply until her next appointment.  Use sparingly.  No red flag signs or symptoms.  The national narcotic database was reviewed and there were no red flags.  CSC was updated as per office policy and a copy was provided  We will check her potassium level since we are collecting blood as above  Her exam and symptoms seem consistent with a carpal tunnel syndrome on the right.  Referral to orthopedics has been placed.  Symptoms thus far are refractory to NSAIDs, bracing.  May be would benefit from corticosteroid injection  No orders of the defined types were placed in this encounter.  No orders of the defined types were placed in this encounter.    Janora Norlander, DO [place] [phone]

## 2020-09-07 NOTE — Patient Instructions (Addendum)
Let me know about colon cancer screening  You had labs performed today.  You will be contacted with the results of the labs once they are available, usually in the next 3 business days for routine lab work.  If you have an active my chart account, they will be released to your MyChart.  If you prefer to have these labs released to you via telephone, please let us know.  If you had a pap smear or biopsy performed, expect to be contacted in about 7-10 days.  Carpal Tunnel Syndrome  Carpal tunnel syndrome is a condition that causes pain, weakness, and numbness in your hand and arm. Numbness is when you cannot feel an area in your body. The carpal tunnel is a narrow area that is on the palm side of your wrist. Repeated wrist motion or certain diseases may cause swelling in the tunnel. This swelling can pinch the main nerve in the wrist. This nerve is called the median nerve. What are the causes? This condition may be caused by:  Moving your hand and wrist over and over again while doing a task.  Injury to the wrist.  Arthritis.  A sac of fluid (cyst) or abnormal growth (tumor) in the carpal tunnel.  Fluid buildup during pregnancy.  Use of tools that vibrate. Sometimes the cause is not known. What increases the risk? The following factors may make you more likely to have this condition:  Having a job that makes you do these things: ? Move your hand over and over again. ? Work with tools that vibrate, such as drills or sanders.  Being a woman.  Having diabetes, obesity, thyroid problems, or kidney failure. What are the signs or symptoms? Symptoms of this condition include:  A tingling feeling in your fingers.  Tingling or loss of feeling in your hand.  Pain in your entire arm. This pain may get worse when you bend your wrist and elbow for a long time.  Pain in your wrist that goes up your arm to your shoulder.  Pain that goes down into your palm or fingers.  Weakness in your  hands. You may find it hard to grab and hold items. You may feel worse at night. How is this treated? This condition may be treated with:  Lifestyle changes. You will be asked to stop or change the activity that caused your problem.  Doing exercises and activities that make bones, muscles, and tendons stronger (physical therapy).  Learning how to use your hand again (occupational therapy).  Medicines for pain and swelling. You may have injections in your wrist.  A wrist splint or brace.  Surgery. Follow these instructions at home: If you have a splint or brace:  Wear the splint or brace as told by your doctor. Take it off only as told by your doctor.  Loosen the splint if your fingers: ? Tingle. ? Become numb. ? Turn cold and blue.  Keep the splint or brace clean.  If the splint or brace is not waterproof: ? Do not let it get wet. ? Cover it with a watertight covering when you take a bath or a shower. Managing pain, stiffness, and swelling If told, put ice on the painful area:  If you have a removable splint or brace, remove it as told by your doctor.  Put ice in a plastic bag.  Place a towel between your skin and the bag.  Leave the ice on for 20 minutes, 2-3 times per day. Do  not fall asleep with the cold pack on your skin.  Take off the ice if your skin turns bright red. This is very important. If you cannot feel pain, heat, or cold, you have a greater risk of damage to the area. Move your fingers often to reduce stiffness and swelling.   General instructions  Take over-the-counter and prescription medicines only as told by your doctor.  Rest your wrist from any activity that may cause pain. If needed, talk with your boss at work about changes that can help your wrist heal.  Do exercises as told by your doctor, physical therapist, or occupational therapist.  Keep all follow-up visits. Contact a doctor if:  You have new symptoms.  Medicine does not help your  pain.  Your symptoms get worse. Get help right away if:  You have very bad numbness or tingling in your wrist or hand. Summary  Carpal tunnel syndrome is a condition that causes pain in your hand and arm.  It is often caused by repeated wrist motions.  Lifestyle changes and medicines are used to treat this problem. Surgery may help in very bad cases.  Follow your doctor's instructions about wearing a splint, resting your wrist, keeping follow-up visits, and calling for help. This information is not intended to replace advice given to you by your health care provider. Make sure you discuss any questions you have with your health care provider. Document Revised: 09/11/2019 Document Reviewed: 09/11/2019 Elsevier Patient Education  2021 [employer].

## 2020-09-08 ENCOUNTER — Telehealth: Payer: Self-pay

## 2020-09-08 NOTE — Telephone Encounter (Signed)
Pt wants to let Nadine Counts know that her insurance will pay for a colonoscopy

## 2020-09-08 NOTE — Addendum Note (Signed)
Addended by: Raliegh Ip on: 09/08/2020 03:44 PM   Modules accepted: Orders

## 2020-09-08 NOTE — Telephone Encounter (Signed)
Referral [facility] placed

## 2020-09-08 NOTE — Telephone Encounter (Signed)
Patient aware.

## 2020-09-08 NOTE — Telephone Encounter (Signed)
Patient had appointment with you yesterday and was to let you know this information.

## 2020-09-09 LAB — DRUG SCREEN 10 W/CONF, SERUM
Amphetamines, IA: NEGATIVE ng/mL
Barbiturates, IA: NEGATIVE ug/mL
Benzodiazepines, IA: NEGATIVE ng/mL
Cocaine & Metabolite, IA: NEGATIVE ng/mL
Methadone, IA: NEGATIVE ng/mL
Opiates, IA: NEGATIVE ng/mL
Oxycodones, IA: NEGATIVE ng/mL
Phencyclidine, IA: NEGATIVE ng/mL
Propoxyphene, IA: NEGATIVE ng/mL
THC(Marijuana) Metabolite, IA: NEGATIVE ng/mL

## 2020-09-09 LAB — BASIC METABOLIC PANEL
BUN/Creatinine Ratio: 17 (ref 9–23)
BUN: 14 mg/dL (ref 6–24)
CO2: 25 mmol/L (ref 20–29)
Calcium: 9.1 mg/dL (ref 8.7–10.2)
Chloride: 103 mmol/L (ref 96–106)
Creatinine, Ser: 0.81 mg/dL (ref 0.57–1.00)
Glucose: 85 mg/dL (ref 65–99)
Potassium: 3.7 mmol/L (ref 3.5–5.2)
Sodium: 142 mmol/L (ref 134–144)
eGFR: 90 mL/min/{1.73_m2} (ref >59)

## 2020-09-13 ENCOUNTER — Other Ambulatory Visit: Payer: Self-pay | Admitting: Family Medicine

## 2020-09-13 DIAGNOSIS — Z1211 Encounter for screening for malignant neoplasm of colon: Secondary | ICD-10-CM

## 2020-09-21 ENCOUNTER — Other Ambulatory Visit: Payer: Self-pay | Admitting: Family Medicine

## 2020-09-24 DIAGNOSIS — M79641 Pain in right hand: Secondary | ICD-10-CM | POA: Diagnosis not present

## 2020-09-24 DIAGNOSIS — G5601 Carpal tunnel syndrome, right upper limb: Secondary | ICD-10-CM | POA: Diagnosis not present

## 2020-10-11 ENCOUNTER — Other Ambulatory Visit: Payer: Self-pay | Admitting: Family Medicine

## 2020-10-16 DIAGNOSIS — Z20822 Contact with and (suspected) exposure to covid-19: Secondary | ICD-10-CM | POA: Diagnosis not present

## 2020-10-29 ENCOUNTER — Encounter: Payer: Self-pay | Admitting: Family Medicine

## 2020-11-03 ENCOUNTER — Other Ambulatory Visit: Payer: Self-pay | Admitting: Family Medicine

## 2020-12-10 ENCOUNTER — Other Ambulatory Visit: Payer: Self-pay | Admitting: Family Medicine

## 2020-12-10 ENCOUNTER — Ambulatory Visit: Payer: BC Managed Care – PPO | Admitting: Family Medicine

## 2020-12-15 ENCOUNTER — Other Ambulatory Visit: Payer: Self-pay | Admitting: Family Medicine

## 2020-12-15 MED ORDER — IBUPROFEN 800 MG PO TABS: 800.0000 mg | ORAL_TABLET | Freq: Three times a day (TID) | ORAL | 0 refills | Status: DC

## 2021-01-04 ENCOUNTER — Other Ambulatory Visit: Payer: Self-pay | Admitting: Family Medicine

## 2021-01-18 ENCOUNTER — Ambulatory Visit: Payer: BC Managed Care – PPO | Admitting: Family Medicine

## 2021-01-18 ENCOUNTER — Other Ambulatory Visit: Payer: Self-pay

## 2021-01-18 ENCOUNTER — Encounter: Payer: Self-pay | Admitting: Family Medicine

## 2021-01-18 VITALS — BP 101/69 | HR 77 | Temp 98.3°F | Ht 60.0 in | Wt 244.0 lb

## 2021-01-18 DIAGNOSIS — G8929 Other chronic pain: Secondary | ICD-10-CM

## 2021-01-18 DIAGNOSIS — M25562 Pain in left knee: Secondary | ICD-10-CM | POA: Diagnosis not present

## 2021-01-18 DIAGNOSIS — M47816 Spondylosis without myelopathy or radiculopathy, lumbar region: Secondary | ICD-10-CM | POA: Diagnosis not present

## 2021-01-18 NOTE — Progress Notes (Signed)
Subjective: CC: Chronic back and knee pain PCP: Raliegh Ip, DO TOI:ZTIWP Neilani Duffee is a 50 y.o. female presenting to clinic today for:  1.  Chronic back and knee pain Patient reports that she has been promoted.  She has not required Tylenol with codeine in over 1 month because she is now at a seated job answering telephones.  She is extremely happy about this.  She continues to have gabapentin on hand if needed as well as Motrin and has not needed any controlled substance at all.  She does not wish to continue refilling this as she does not need the medicine.   ROS: Per HPI  No Known Allergies Past Medical History:  Diagnosis Date   Fatty liver    Fatty liver    GERD (gastroesophageal reflux disease)    Hiatal hernia    Hypertension    Hypokalemia    OA (osteoarthritis)    Plantar fasciitis    Vitamin D deficiency     Current Outpatient Medications:    acetaminophen-codeine (TYLENOL #3) 300-30 MG tablet, Take 1 tablet by mouth every 8 (eight) hours as needed for moderate pain or severe pain., Disp: 90 tablet, Rfl: 1   albuterol (PROAIR HFA) 108 (90 Base) MCG/ACT inhaler, Inhale 2 puffs into the lungs every 4 (four) hours as needed for wheezing or shortness of breath., Disp: 6.7 g, Rfl: 0   citalopram (CELEXA) 40 MG tablet, Take 1 tablet (40 mg total) by mouth daily., Disp: 90 tablet, Rfl: 3   cyclobenzaprine (FLEXERIL) 10 MG tablet, Take 1 tablet (10 mg total) by mouth 3 (three) times daily as needed for muscle spasms., Disp: 30 tablet, Rfl: 2   gabapentin (NEURONTIN) 100 MG capsule, Take 1 capsule (100 mg total) by mouth 2 (two) times daily., Disp: 180 capsule, Rfl: 3   hydrochlorothiazide (HYDRODIURIL) 25 MG tablet, TAKE 1 TABLET (25 MG TOTAL) BY MOUTH DAILY WITH BREAKFAST., Disp: 30 tablet, Rfl: 0   ibuprofen (ADVIL) 800 MG tablet, Take 1 tablet (800 mg total) by mouth 3 (three) times daily., Disp: 270 tablet, Rfl: 0   loratadine (CLARITIN) 10 MG tablet, Take 1  tablet (10 mg total) by mouth daily., Disp: 90 tablet, Rfl: 1   metoprolol succinate (TOPROL-XL) 25 MG 24 hr tablet, Take 1 tablet (25 mg total) by mouth daily., Disp: 90 tablet, Rfl: 0   pantoprazole (PROTONIX) 40 MG tablet, Take 1 tablet (40 mg total) by mouth 2 (two) times daily., Disp: 180 tablet, Rfl: 0   Potassium Chloride ER 20 MEQ TBCR, TAKE 1 TABLET BY MOUTH EVERY DAY, Disp: 90 tablet, Rfl: 0   pramipexole (MIRAPEX) 0.25 MG tablet, Take 0.75 mg by mouth 2 (two) times daily. Supper and bedtime, Disp: , Rfl:  Social History   Socioeconomic History   Marital status: Married    Spouse name: Not on file   Number of children: 3   Years of education: Not on file   Highest education level: Not on file  Occupational History   Occupation: TEXTILE  Tobacco Use   Smoking status: Never   Smokeless tobacco: Never  Vaping Use   Vaping Use: Never used  Substance and Sexual Activity   Alcohol use: No    Alcohol/week: 0.0 standard drinks   Drug use: No   Sexual activity: Not on file  Other Topics Concern   Not on file  Social History Narrative   Not on file   Social Determinants of Health   Financial  Resource Strain: Not on file  Food Insecurity: Not on file  Transportation Needs: Not on file  Physical Activity: Not on file  Stress: Not on file  Social Connections: Not on file  Intimate Partner Violence: Not on file   Family History  Problem Relation Age of Onset   High blood pressure Mother    Diabetes Mother    Kidney cancer Mother    Hyperlipidemia Sister    Heart disease Sister    Heart disease Brother    Hyperlipidemia Brother    Colon cancer Neg Hx    Rectal cancer Neg Hx    Stomach cancer Neg Hx     Objective: Office vital signs reviewed. BP 101/69   Pulse 77   Temp 98.3 F (36.8 C)   Ht 5' (1.524 m)   Wt 244 lb (110.7 kg)   SpO2 96%   BMI 47.65 kg/m   Physical Examination:  General: Awake, alert, well nourished, No acute distress Cardio: regular rate  and rhythm, S1S2 heard, no murmurs appreciated Pulm: clear to auscultation bilaterally, no wheezes, rhonchi or rales; normal work of breathing on room air MSK: Ambulating independently  Assessment/ Plan: 50 y.o. female   Spondylosis of lumbar region without myelopathy or radiculopathy  Chronic pain of left knee  She is off of Tylenol with codeine and pain is controlled because she is not having to do the physical labor that was exacerbating it.  I congratulated her on her new position.  She may follow-up in 6 months for regular annual physical with fasting labs  No orders of the defined types were placed in this encounter.  No orders of the defined types were placed in this encounter.    Raliegh Ip, DO [facility] [phone]

## 2021-01-28 ENCOUNTER — Other Ambulatory Visit: Payer: Self-pay | Admitting: Family Medicine

## 2021-02-01 ENCOUNTER — Other Ambulatory Visit: Payer: Self-pay | Admitting: Family Medicine

## 2021-02-25 ENCOUNTER — Other Ambulatory Visit: Payer: Self-pay | Admitting: Family Medicine

## 2021-02-25 DIAGNOSIS — I471 Supraventricular tachycardia: Secondary | ICD-10-CM

## 2021-03-07 ENCOUNTER — Encounter: Payer: Self-pay | Admitting: Family Medicine

## 2021-03-07 ENCOUNTER — Other Ambulatory Visit: Payer: Self-pay | Admitting: Family Medicine

## 2021-03-07 DIAGNOSIS — B9689 Other specified bacterial agents as the cause of diseases classified elsewhere: Secondary | ICD-10-CM

## 2021-03-08 MED ORDER — ALBUTEROL SULFATE HFA 108 (90 BASE) MCG/ACT IN AERS: 2.0000 | INHALATION_SPRAY | RESPIRATORY_TRACT | 0 refills | Status: DC | PRN

## 2021-04-04 ENCOUNTER — Other Ambulatory Visit: Payer: Self-pay | Admitting: Family Medicine

## 2021-04-04 DIAGNOSIS — B9689 Other specified bacterial agents as the cause of diseases classified elsewhere: Secondary | ICD-10-CM

## 2021-04-04 DIAGNOSIS — J208 Acute bronchitis due to other specified organisms: Secondary | ICD-10-CM

## 2021-04-06 ENCOUNTER — Other Ambulatory Visit: Payer: Self-pay | Admitting: Family Medicine

## 2021-04-06 DIAGNOSIS — L814 Other melanin hyperpigmentation: Secondary | ICD-10-CM | POA: Diagnosis not present

## 2021-05-02 ENCOUNTER — Other Ambulatory Visit: Payer: Self-pay | Admitting: Family Medicine

## 2021-05-02 DIAGNOSIS — B9689 Other specified bacterial agents as the cause of diseases classified elsewhere: Secondary | ICD-10-CM

## 2021-05-16 ENCOUNTER — Other Ambulatory Visit: Payer: Self-pay | Admitting: Family Medicine

## 2021-06-13 ENCOUNTER — Other Ambulatory Visit: Payer: Self-pay | Admitting: Family Medicine

## 2021-06-13 DIAGNOSIS — F411 Generalized anxiety disorder: Secondary | ICD-10-CM

## 2021-06-15 ENCOUNTER — Other Ambulatory Visit: Payer: Self-pay | Admitting: Family Medicine

## 2021-07-01 ENCOUNTER — Encounter: Payer: Self-pay | Admitting: Family Medicine

## 2021-07-01 ENCOUNTER — Ambulatory Visit: Payer: BC Managed Care – PPO | Admitting: Family Medicine

## 2021-07-01 VITALS — BP 108/73 | HR 65 | Temp 98.3°F | Ht 60.0 in | Wt 252.8 lb

## 2021-07-01 DIAGNOSIS — E7800 Pure hypercholesterolemia, unspecified: Secondary | ICD-10-CM

## 2021-07-01 DIAGNOSIS — Z8719 Personal history of other diseases of the digestive system: Secondary | ICD-10-CM

## 2021-07-01 DIAGNOSIS — E876 Hypokalemia: Secondary | ICD-10-CM

## 2021-07-01 DIAGNOSIS — G4719 Other hypersomnia: Secondary | ICD-10-CM

## 2021-07-01 DIAGNOSIS — R7303 Prediabetes: Secondary | ICD-10-CM

## 2021-07-01 DIAGNOSIS — M7702 Medial epicondylitis, left elbow: Secondary | ICD-10-CM | POA: Diagnosis not present

## 2021-07-01 DIAGNOSIS — R0683 Snoring: Secondary | ICD-10-CM

## 2021-07-01 DIAGNOSIS — R1013 Epigastric pain: Secondary | ICD-10-CM | POA: Diagnosis not present

## 2021-07-01 DIAGNOSIS — Z9889 Other specified postprocedural states: Secondary | ICD-10-CM | POA: Diagnosis not present

## 2021-07-01 MED ORDER — POTASSIUM CHLORIDE ER 20 MEQ PO TBCR: 1.0000 | EXTENDED_RELEASE_TABLET | Freq: Two times a day (BID) | ORAL | 3 refills | Status: DC

## 2021-07-01 NOTE — Progress Notes (Signed)
Subjective: CC: Elbow pain, stomach PCP: Raliegh Ip, DO ZSM:OLMBE Darlene [name] is a 51 y.o. female presenting to clinic today for:  1.  Elbow pain Patient reports several month history of medial left-sided elbow pain.  She reports that it aches and hurts.  She points to the medial epicondyles as the source of pain.  No preceding injury.  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  No sensory changes or weakness reported  2.  Epigastric pain Patient reports that she has been experiencing some epigastric pain similar to when she had hiatal hernia.  She is gone through 2 hiatal hernia surgeries at [facility].  She is compliant with her Protonix twice daily.  She does not report any change in bowel habits, blood in stool, nausea or vomiting.  She would like to see a digestive specialist in [facility] if possible for reevaluation.  3.  Hyperlipidemia Patient had labs done with her job recently which showed elevated LDL to 115, glucose to 116 with A1c of 5.7 and potassium of 3.3.  She is not currently treated with any cholesterol-lowering medications.  She admits that there are areas in her diet which could be improved.  4.  Hypokalemia/excessive daytime fatigue Patient is compliant with her potassium daily.  She is treated with hydrochlorothiazide.  She never really is able to obtain a potassium above 3.5.  She does report excessive fatigue.  Denies any cramping.  Her husband notes that she snores.  She is okay for about an hour after she wakes up but then is tired the rest of the day   ROS: Per HPI  No Known Allergies Past Medical History:  Diagnosis Date   Fatty liver    Fatty liver    GERD (gastroesophageal reflux disease)    Hiatal hernia    Hypertension    Hypokalemia    OA (osteoarthritis)    Plantar fasciitis    Vitamin D deficiency     Current Outpatient Medications:    albuterol (VENTOLIN HFA) 108 (90 Base) MCG/ACT inhaler,  INHALE 2 PUFFS BY MOUTH EVERY 4 HOURS AS NEEDED FOR WHEEZE OR FOR SHORTNESS OF BREATH, Disp: 6.7 each, Rfl: 0   citalopram (CELEXA) 40 MG tablet, Take 1 tablet (40 mg total) by mouth daily. (NEEDS TO BE SEEN BEFORE NEXT REFILL), Disp: 30 tablet, Rfl: 0   cyclobenzaprine (FLEXERIL) 10 MG tablet, Take 1 tablet (10 mg total) by mouth 3 (three) times daily as needed for muscle spasms., Disp: 30 tablet, Rfl: 2   gabapentin (NEURONTIN) 100 MG capsule, Take 1 capsule (100 mg total) by mouth 2 (two) times daily., Disp: 180 capsule, Rfl: 3   hydrochlorothiazide (HYDRODIURIL) 25 MG tablet, TAKE 1 TABLET (25 MG TOTAL) BY MOUTH DAILY WITH BREAKFAST., Disp: 90 tablet, Rfl: 1   ibuprofen (ADVIL) 800 MG tablet, TAKE 1 TABLET BY MOUTH THREE TIMES A DAY, Disp: 270 tablet, Rfl: 0   loratadine (CLARITIN) 10 MG tablet, TAKE 1 TABLET BY MOUTH EVERY DAY, Disp: 90 tablet, Rfl: 1   metoprolol succinate (TOPROL-XL) 25 MG 24 hr tablet, TAKE 1 TABLET (25 MG TOTAL) BY MOUTH DAILY., Disp: 90 tablet, Rfl: 1   pantoprazole (PROTONIX) 40 MG tablet, Take 1 tablet (40 mg total) by mouth 2 (two) times daily., Disp: 180 tablet, Rfl: 0   Potassium Chloride ER 20 MEQ TBCR, TAKE 1 TABLET BY MOUTH EVERY DAY, Disp: 90 tablet, Rfl: 3   pramipexole (MIRAPEX) 0.25 MG  tablet, Take 0.75 mg by mouth 2 (two) times daily. Supper and bedtime, Disp: , Rfl:  Social History   Socioeconomic History   Marital status: Married    Spouse name: Not on file   Number of children: 3   Years of education: Not on file   Highest education level: Not on file  Occupational History   Occupation: TEXTILE  Tobacco Use   Smoking status: Never   Smokeless tobacco: Never  Vaping Use   Vaping Use: Never used  Substance and Sexual Activity   Alcohol use: No    Alcohol/week: 0.0 standard drinks   Drug use: No   Sexual activity: Not on file  Other Topics Concern   Not on file  Social History Narrative   Not on file   Social Determinants of Health    Financial Resource Strain: Not on file  Food Insecurity: Not on file  Transportation Needs: Not on file  Physical Activity: Not on file  Stress: Not on file  Social Connections: Not on file  Intimate Partner Violence: Not on file   Family History  Problem Relation Age of Onset   High blood pressure Mother    Diabetes Mother    Kidney cancer Mother    Hyperlipidemia Sister    Heart disease Sister    Heart disease Brother    Hyperlipidemia Brother    Colon cancer Neg Hx    Rectal cancer Neg Hx    Stomach cancer Neg Hx     Objective: Office vital signs reviewed. BP 108/73    Pulse 65    Temp 98.3 F (36.8 C)    Ht 5' (1.524 m)    Wt 252 lb 12.8 oz (114.7 kg)    SpO2 95%    BMI 49.37 kg/m   Physical Examination:  General: Awake, alert, morbidly obese, No acute distress HEENT: Sclera white.  No exophthalmos.  No goiter Cardio: regular rate and rhythm, S1S2 heard, no murmurs appreciated Pulm: clear to auscultation bilaterally, no wheezes, rhonchi or rales; normal work of breathing on room air GI: Epigastric tenderness to palpation present.  No rebound or guarding.  No hepatosplenomegaly or masses appreciated Extremities: warm, well perfused, No edema, cyanosis or clubbing; +2 pulses bilaterally MSK: No pain or difficulty with pronation or supination of the left forearm.  She has exquisite tenderness to palpation along the medial epicondyles.  No appreciable warmth, erythema or soft tissue swelling appreciated  Assessment/ Plan: 51 y.o. female   Medial epicondylitis of left elbow  Epigastric pain - Plan: Ambulatory referral to Gastroenterology  History of repair of hiatal hernia - Plan: Ambulatory referral to Gastroenterology  Pure hypercholesterolemia  Morbid obesity (HCC)  Pre-diabetes  Hypokalemia - Plan: Basic metabolic panel, Magnesium, Potassium Chloride ER 20 MEQ TBCR  Snoring - Plan: Ambulatory referral to Sleep Studies  Excessive daytime sleepiness -  Plan: Ambulatory referral to Sleep Studies  Suspect medial epicondylitis.  Home care instructions reviewed.  Handout provided.  If no significant improvement, advised to see orthopedics for further evaluation.  She has been preemptively scheduled for an office visit with Dr Dallas Schimke if needed in April  Referral to gastroenterology placed for ongoing epigastric pain despite PPI.  Suspect she may have recurrent hiatal hernia.  She has been referred to [facility] as she requested  I reviewed her cholesterol levels and calculated ASCVD risk score which was 1.1% today.  Weight loss is recommended to reduce risk of progression into diabetes and other complications related to  morbid obesity.  For her hypokalemia she will advance her potassium to twice daily.  Repeat BMP and a magnesium order has been placed  Suspect her excessive daytime sleepiness and snoring are related to undiagnosed sleep apnea.  Referral to sleep studies placed  No orders of the defined types were placed in this encounter.  No orders of the defined types were placed in this encounter.    Raliegh Ip, DO [facility] [phone]

## 2021-07-01 NOTE — Patient Instructions (Signed)
Potassium increased to twice daily.  If it is too hard for you to remember to take it twice a day you can take 2 tablets as a single dose.  Future orders for repeat potassium level has been placed.  You do not need an appointment nor do you need to fast for that Referral to sleep medicine placed.  Suspect that you have undiagnosed sleep apnea Referral to new gastroenterologist in [place] placed. Appointment made for orthopedics to evaluate left elbow if does not get better with therapies I recommended  Golfer's Elbow Golfer's elbow (medial epicondylitis) is a condition that results from inflammation of the strong bands of tissue (tendons) that attach your forearm muscles to the inside of your bone at the elbow. These tendons affect the muscles that bend the palm toward the wrist (flexion). The tendons become less flexible with age. This condition is called golfer's elbow because it is more common among people who constantly bend and twist their wrists, such as golfers. This injury is usually caused by repeated use of the same muscles. What are the causes? This condition is caused by: Repeatedly flexing, turning, or twisting your wrist. Frequently gripping objects with your hands. Sudden injury. What increases the risk? This condition is more likely to develop in people who play golf, baseball, or tennis. This injury is more common among people who have jobs that require the constant use of their hands, such as: People who use computers. Carpenters. Butchers. Musicians. What are the signs or symptoms? This condition causes elbow pain that may spread to your forearm and upper arm. Symptoms of this condition include: Pain at the inner elbow, forearm, or wrist. A weak grip in the hand. The pain may get worse when you bend your wrist downward. How is this diagnosed? This condition is diagnosed based on your symptoms, your medical history, and a physical exam. During the exam, your health care  provider may: Test your grip strength. Move your wrist to check for pain. You may also have an MRI to: Confirm the diagnosis. Look for other issues. Check for tears in the ligaments, muscles, or tendons. How is this treated? Treatment for this condition includes: Stopping all activities that make you bend or twist your elbow or wrist and waiting until your pain and other symptoms go away before resuming those activities. Wearing an elbow brace or wrist splint to restrict the movements that cause symptoms. Icing your inner elbow, forearm, or wrist to relieve pain. Taking NSAIDs, such as ibuprofen, or getting corticosteroid injections to reduce pain and swelling. Doing stretching, range-of-motion, and strengthening exercises (physical therapy) as told by your health care provider. In rare cases, surgery may be needed if your condition does not improve. Follow these instructions at home: If you have a brace or splint: Wear the brace or splint as told by your health care provider. Remove it only as told by your health care provider. Check the skin around the brace or splint every day. Tell your health care provider about any concerns. Loosen the brace or splint if your fingers tingle, become numb, or turn cold and blue. Keep it clean. If the brace or splint is not waterproof: Do not let it get wet. Cover it with a watertight covering when you take a bath or shower. Managing pain, stiffness, and swelling  If directed, put ice on the injured area. To do this: If you have a removable brace or splint, remove it as told by your health care provider. Put ice  in a plastic bag. Place a towel between your skin and the bag. Leave the ice on for 20 minutes, 2-3 times a day. Remove the ice if your skin turns bright red. This is very important. If you cannot feel pain, heat, or cold, you have a greater risk of damage to the area. Move your fingers often to avoid stiffness and swelling. Activity Rest  your injured area as told by your health care provider. Return to your normal activities as told by your health care provider. Ask your health care provider what activities are safe for you. Do exercises as told by your health care provider. Lifestyle If your condition is caused by sports, work with a trainer to make sure that you: Use the correct technique. Use the proper equipment. If your condition is work related, talk with your employer about ways to manage your condition at work. General instructions Take over-the-counter and prescription medicines only as told by your health care provider. Do not use any products that contain nicotine or tobacco. These products include cigarettes, chewing tobacco, and vaping devices, such as e-cigarettes. If you need help quitting, ask your health care provider. Keep all follow-up visits. This is important. How is this prevented? Before and after activity: Warm up and stretch before being active. Cool down and stretch after being active. Give your body time to rest between periods of activity. During activity: Make sure to use equipment that fits you. If you play golf, slow your golf swing to reduce shock in the arm when making contact with the ball. Maintain physical fitness, including: Strength. Flexibility. Endurance. Do exercises to strengthen the forearm muscles. Contact a health care provider if: Your pain does not improve or it gets worse. You notice numbness in your hand. Get help right away if: Your pain is severe. You cannot move your wrist. Summary Golfer's elbow, also called medial epicondylitis, is a condition that results from inflammation of the strong bands of tissue (tendons) that attach your forearm muscles to the inside of your bone at the elbow. This injury usually results from overuse. Symptoms of this condition include decreased grip strength and pain at the inner elbow, forearm, or wrist. This injury is treated with  rest, a brace or splint, ice, medicines, physical therapy, and surgery as needed. This information is not intended to replace advice given to you by your health care provider. Make sure you discuss any questions you have with your health care provider. Document Revised: 11/11/2019 Document Reviewed: 11/11/2019 Elsevier Patient Education  2022 [employer].

## 2021-07-06 DIAGNOSIS — R1013 Epigastric pain: Secondary | ICD-10-CM | POA: Diagnosis not present

## 2021-07-06 DIAGNOSIS — K219 Gastro-esophageal reflux disease without esophagitis: Secondary | ICD-10-CM | POA: Diagnosis not present

## 2021-07-06 DIAGNOSIS — R1310 Dysphagia, unspecified: Secondary | ICD-10-CM | POA: Diagnosis not present

## 2021-07-06 DIAGNOSIS — Z8719 Personal history of other diseases of the digestive system: Secondary | ICD-10-CM | POA: Diagnosis not present

## 2021-07-09 ENCOUNTER — Other Ambulatory Visit: Payer: Self-pay | Admitting: Family Medicine

## 2021-07-09 DIAGNOSIS — F411 Generalized anxiety disorder: Secondary | ICD-10-CM

## 2021-07-13 DIAGNOSIS — D122 Benign neoplasm of ascending colon: Secondary | ICD-10-CM | POA: Diagnosis not present

## 2021-07-13 DIAGNOSIS — I10 Essential (primary) hypertension: Secondary | ICD-10-CM | POA: Diagnosis not present

## 2021-07-13 DIAGNOSIS — R1310 Dysphagia, unspecified: Secondary | ICD-10-CM | POA: Diagnosis not present

## 2021-07-13 DIAGNOSIS — Z1211 Encounter for screening for malignant neoplasm of colon: Secondary | ICD-10-CM | POA: Diagnosis not present

## 2021-07-13 DIAGNOSIS — R1013 Epigastric pain: Secondary | ICD-10-CM | POA: Diagnosis not present

## 2021-07-18 ENCOUNTER — Encounter: Payer: Self-pay | Admitting: Family Medicine

## 2021-07-18 ENCOUNTER — Ambulatory Visit (INDEPENDENT_AMBULATORY_CARE_PROVIDER_SITE_OTHER): Payer: BC Managed Care – PPO | Admitting: Family Medicine

## 2021-07-18 VITALS — BP 116/75 | HR 71 | Temp 97.9°F | Ht 60.0 in | Wt 247.6 lb

## 2021-07-18 DIAGNOSIS — Z0001 Encounter for general adult medical examination with abnormal findings: Secondary | ICD-10-CM

## 2021-07-18 DIAGNOSIS — E7800 Pure hypercholesterolemia, unspecified: Secondary | ICD-10-CM | POA: Diagnosis not present

## 2021-07-18 DIAGNOSIS — R7303 Prediabetes: Secondary | ICD-10-CM | POA: Diagnosis not present

## 2021-07-18 DIAGNOSIS — K5909 Other constipation: Secondary | ICD-10-CM

## 2021-07-18 DIAGNOSIS — Z130 Encounter for screening for diseases of the blood and blood-forming organs and certain disorders involving the immune mechanism: Secondary | ICD-10-CM | POA: Diagnosis not present

## 2021-07-18 DIAGNOSIS — Z0000 Encounter for general adult medical examination without abnormal findings: Secondary | ICD-10-CM

## 2021-07-18 DIAGNOSIS — M47816 Spondylosis without myelopathy or radiculopathy, lumbar region: Secondary | ICD-10-CM

## 2021-07-18 LAB — LIPID PANEL

## 2021-07-18 LAB — BAYER DCA HB A1C WAIVED: HB A1C (BAYER DCA - WAIVED): 5.6 % (ref 4.8–5.6)

## 2021-07-18 MED ORDER — TRULANCE 3 MG PO TABS: 3.0000 mg | ORAL_TABLET | Freq: Every day | ORAL | 3 refills | Status: DC

## 2021-07-18 NOTE — Progress Notes (Signed)
Darlene [name] is a 51 y.o. female presents to office today for annual physical exam examination.    Concerns today include: 1.  Chronic constipation Patient reports that she suffers from chronic constipation.  She has been treated with Linzess 145 in the past which did cause a little bit too much stimulation of the gut.  She recently saw her gastroenterologist and had both endoscopy and colonoscopy performed.  She reports the endoscopy showed no progression of her hiatal hernia.  Colonoscopy was normal.  She denies any GI symptoms other than straining.  She has been advised to use Colace and MiraLAX at bedtime but she notes that this has not been an easy regimen for her.  She would like to go back on a singular oral dose if possible.  Diet: balanced, Exercise: no structured reported Last eye exam: UTD Last dental exam: UTD Last colonoscopy: UTD 07/13/2021 Last mammogram: needs Last pap smear: UTD Refills needed today: none Immunizations needed: Immunization History  Administered Date(s) Administered   Hepatitis B 05/05/2021   Influenza Inj Mdck Quad Pf 02/28/2021   Influenza Inj Mdck Quad With Preservative 05/19/2019   Influenza, Quadrivalent, Recombinant, Inj, Pf 04/03/2016, 02/28/2017   Influenza,inj,Quad PF,6+ Mos 03/01/2015, 04/03/2016, 02/28/2017, 03/13/2018, 03/09/2020   Influenza-Unspecified 03/01/2015   Moderna Sars-Covid-2 Vaccination 07/24/2019, 08/23/2019   Tdap 03/13/2018     Past Medical History:  Diagnosis Date   Fatty liver    Fatty liver    GERD (gastroesophageal reflux disease)    Hiatal hernia    Hypertension    Hypokalemia    OA (osteoarthritis)    Plantar fasciitis    Vitamin D deficiency    Social History   Socioeconomic History   Marital status: Married    Spouse name: Not on file   Number of children: 3   Years of education: Not on file   Highest education level: Not on file  Occupational History   Occupation: TEXTILE  Tobacco Use    Smoking status: Never   Smokeless tobacco: Never  Vaping Use   Vaping Use: Never used  Substance and Sexual Activity   Alcohol use: No    Alcohol/week: 0.0 standard drinks   Drug use: No   Sexual activity: Not on file  Other Topics Concern   Not on file  Social History Narrative   Not on file   Social Determinants of Health   Financial Resource Strain: Not on file  Food Insecurity: Not on file  Transportation Needs: Not on file  Physical Activity: Not on file  Stress: Not on file  Social Connections: Not on file  Intimate Partner Violence: Not on file   Past Surgical History:  Procedure Laterality Date   [place] N/A 01/28/2016   Procedure: INSERTION OF MESH;  Surgeon: Ralene Ok, MD;  Location: [facility];  Service: General;  Laterality: N/A;   INSERTION OF MESH  09/06/2018   Procedure: INSERTION OF MESH;  Surgeon: Ralene Ok, MD;  Location: [facility];  Service: General;;   Right Knee Surgery     tubal ligation     Family History  Problem Relation Age of Onset   High blood pressure Mother    Diabetes Mother    Kidney cancer Mother    Hyperlipidemia Sister    Heart disease Sister    Heart disease Brother    Hyperlipidemia Brother    Colon cancer Neg Hx  Rectal cancer Neg Hx    Stomach cancer Neg Hx     Current Outpatient Medications:    albuterol (VENTOLIN HFA) 108 (90 Base) MCG/ACT inhaler, INHALE 2 PUFFS BY MOUTH EVERY 4 HOURS AS NEEDED FOR WHEEZE OR FOR SHORTNESS OF BREATH, Disp: 6.7 each, Rfl: 0   citalopram (CELEXA) 40 MG tablet, Take 1 tablet (40 mg total) by mouth daily., Disp: 90 tablet, Rfl: 0   cyclobenzaprine (FLEXERIL) 10 MG tablet, Take 1 tablet (10 mg total) by mouth 3 (three) times daily as needed for muscle spasms., Disp: 30 tablet, Rfl: 2   gabapentin (NEURONTIN) 100 MG capsule, Take 1 capsule (100 mg total) by mouth 2 (two) times daily., Disp: 180 capsule, Rfl: 3    hydrochlorothiazide (HYDRODIURIL) 25 MG tablet, TAKE 1 TABLET (25 MG TOTAL) BY MOUTH DAILY WITH BREAKFAST., Disp: 90 tablet, Rfl: 1   ibuprofen (ADVIL) 800 MG tablet, TAKE 1 TABLET BY MOUTH THREE TIMES A DAY, Disp: 270 tablet, Rfl: 0   loratadine (CLARITIN) 10 MG tablet, TAKE 1 TABLET BY MOUTH EVERY DAY, Disp: 90 tablet, Rfl: 1   metoprolol succinate (TOPROL-XL) 25 MG 24 hr tablet, TAKE 1 TABLET (25 MG TOTAL) BY MOUTH DAILY., Disp: 90 tablet, Rfl: 1   pantoprazole (PROTONIX) 40 MG tablet, Take 1 tablet (40 mg total) by mouth 2 (two) times daily., Disp: 180 tablet, Rfl: 0   Potassium Chloride ER 20 MEQ TBCR, Take 1 tablet by mouth in the morning and at bedtime. For low potassium, Disp: 180 tablet, Rfl: 3   pramipexole (MIRAPEX) 0.25 MG tablet, Take 0.75 mg by mouth 2 (two) times daily. Supper and bedtime, Disp: , Rfl:   No Known Allergies   ROS: Review of Systems Pertinent items noted in HPI and remainder of comprehensive ROS otherwise negative.    Physical exam BP 116/75    Pulse 71    Temp 97.9 F (36.6 C)    Ht 5' (1.524 m)    Wt 247 lb 9.6 oz (112.3 kg)    SpO2 97%    BMI 48.36 kg/m  General appearance: alert, cooperative, appears stated age, and morbidly obese Head: Normocephalic, without obvious abnormality, atraumatic Eyes: negative findings: lids and lashes normal, conjunctivae and sclerae normal, corneas clear, and pupils equal, round, reactive to light and accomodation Ears: normal TM's and external ear canals both ears Nose: Nares normal. Septum midline. Mucosa normal. No drainage or sinus tenderness. Throat: lips, mucosa, and tongue normal; teeth and gums normal Neck: no adenopathy, no carotid bruit, supple, symmetrical, trachea midline, and thyroid not enlarged, symmetric, no tenderness/mass/nodules Back: symmetric, no curvature. ROM normal. No CVA tenderness. Lungs: clear to auscultation bilaterally Heart: regular rate and rhythm, S1, S2 normal, no murmur, click, rub or  gallop Abdomen: soft, non-tender; bowel sounds normal; no masses,  no organomegaly and obese Extremities: extremities normal, atraumatic, no cyanosis or edema Pulses: 2+ and symmetric Skin: Skin color, texture, turgor normal. No rashes or lesions Lymph nodes: Cervical, supraclavicular, and axillary nodes normal. Neurologic: Grossly normal Psych: Mood stable, speech normal, affect appropriate  Depression screen [facility] 2/9 07/18/2021 07/01/2021 01/18/2021  Decreased Interest 0 0 0  Down, Depressed, Hopeless 0 0 0  PHQ - 2 Score 0 0 0  Altered sleeping 0 2 -  Tired, decreased energy 0 2 -  Change in appetite 0 0 -  Feeling bad or failure about yourself  0 0 -  Trouble concentrating 0 0 -  Moving slowly or fidgety/restless 0 0 -  Suicidal thoughts 0 0 -  PHQ-9 Score 0 4 -  Difficult doing work/chores Not difficult at all Not difficult at all -  Some recent data might be hidden   GAD 7 : Generalized Anxiety Score 07/18/2021 07/01/2021 01/18/2021 09/08/2019  Nervous, Anxious, on Edge 0 0 0 0  Control/stop worrying 0 0 0 0  Worry too much - different things 0 0 0 0  Trouble relaxing 0 0 0 0  Restless 0 0 0 0  Easily annoyed or irritable 0 0 0 1  Afraid - awful might happen 0 0 0 0  Total GAD 7 Score 0 0 0 1  Anxiety Difficulty Not difficult at all Not difficult at all Not difficult at all Not difficult at all    Assessment/ Plan: Freddi Starr here for annual physical exam.   Annual physical exam  Pure hypercholesterolemia - Plan: CMP14+EGFR, Lipid Panel, TSH  Morbid obesity ([place]) - Plan: CMP14+EGFR, Lipid Panel, TSH, Bayer DCA Hb A1c Waived  Pre-diabetes - Plan: Bayer DCA Hb A1c Waived  Spondylosis of lumbar region without myelopathy or radiculopathy  Screening, anemia, deficiency, iron - Plan: CBC  Chronic constipation - Plan: Plecanatide (TRULANCE) 3 MG TABS  Colonoscopy completed on March 1.  Fasting labs ordered.  CMP, A1c, TSH ordered given history of prediabetes and  obesity  Back issues are stable.  No refills needed  I gave her a sample of the low-dose Linzess but after further investigation her insurance does not cover Linzess it covers Trulance so coupon card was given for Trulance as well.  Rx has been sent.  Anticipate prior authorization needed  Counseled on healthy lifestyle choices, including diet (rich in fruits, vegetables and lean meats and low in salt and simple carbohydrates) and exercise (at least 30 minutes of moderate physical activity daily).  Patient to follow up in 1 year for annual exam or sooner if needed.  Bode Pieper M. Lajuana Ripple, DO

## 2021-07-19 LAB — CMP14+EGFR
ALT: 15 IU/L (ref 0–32)
AST: 15 IU/L (ref 0–40)
Albumin/Globulin Ratio: 1.5 (ref 1.2–2.2)
Albumin: 3.9 g/dL (ref 3.8–4.8)
Alkaline Phosphatase: 107 IU/L (ref 44–121)
BUN/Creatinine Ratio: 19 (ref 9–23)
BUN: 15 mg/dL (ref 6–24)
Bilirubin Total: 0.3 mg/dL (ref 0.0–1.2)
CO2: 24 mmol/L (ref 20–29)
Calcium: 8.9 mg/dL (ref 8.7–10.2)
Chloride: 100 mmol/L (ref 96–106)
Creatinine, Ser: 0.78 mg/dL (ref 0.57–1.00)
Globulin, Total: 2.6 g/dL (ref 1.5–4.5)
Glucose: 102 mg/dL — ABNORMAL HIGH (ref 70–99)
Potassium: 3.4 mmol/L — ABNORMAL LOW (ref 3.5–5.2)
Sodium: 137 mmol/L (ref 134–144)
Total Protein: 6.5 g/dL (ref 6.0–8.5)
eGFR: 94 mL/min/{1.73_m2} (ref >59)

## 2021-07-19 LAB — LIPID PANEL
Chol/HDL Ratio: 2.8 ratio (ref 0.0–4.4)
Cholesterol, Total: 151 mg/dL (ref 100–199)
HDL: 53 mg/dL (ref >39)
LDL Chol Calc (NIH): 78 mg/dL (ref 0–99)
Triglycerides: 108 mg/dL (ref 0–149)
VLDL Cholesterol Cal: 20 mg/dL (ref 5–40)

## 2021-07-19 LAB — CBC
Hematocrit: 40 % (ref 34.0–46.6)
Hemoglobin: 13.9 g/dL (ref 11.1–15.9)
MCH: 30.5 pg (ref 26.6–33.0)
MCHC: 34.8 g/dL (ref 31.5–35.7)
MCV: 88 fL (ref 79–97)
Platelets: 225 10*3/uL (ref 150–450)
RBC: 4.56 x10E6/uL (ref 3.77–5.28)
RDW: 12.1 % (ref 11.7–15.4)
WBC: 7.5 10*3/uL (ref 3.4–10.8)

## 2021-07-19 LAB — TSH: TSH: 1.06 u[IU]/mL (ref 0.450–4.500)

## 2021-07-25 ENCOUNTER — Ambulatory Visit: Payer: BC Managed Care – PPO | Admitting: Orthopedic Surgery

## 2021-07-25 ENCOUNTER — Encounter: Payer: Self-pay | Admitting: Radiology

## 2021-07-25 ENCOUNTER — Ambulatory Visit: Payer: BC Managed Care – PPO

## 2021-07-25 ENCOUNTER — Other Ambulatory Visit: Payer: Self-pay

## 2021-07-25 ENCOUNTER — Encounter: Payer: Self-pay | Admitting: Orthopedic Surgery

## 2021-07-25 DIAGNOSIS — M7702 Medial epicondylitis, left elbow: Secondary | ICD-10-CM | POA: Diagnosis not present

## 2021-07-25 DIAGNOSIS — M25522 Pain in left elbow: Secondary | ICD-10-CM

## 2021-07-25 NOTE — Progress Notes (Signed)
New Patient Visit ? ?Assessment: ?[name] is a 51 y.o. female with the following: ?1. Medial epicondylitis of elbow, left ? ?Plan: ?Left elbow pain over the medial elbow for the past 6 months.  Atraumatic onset.  Radiographs are normal.  She has not worked with PT.  Cannot take NSAIDs.  Recommend PT and voltaren gel.  Follow up as needed.  ? ? ?Follow-up: ?Return if symptoms worsen or fail to improve. ? ?Subjective: ? ?Chief Complaint  ?Patient presents with  ? Elbow Pain  ?  Med elbow pain x 1 month, occas tylenol.  Wearing brace.    ? ? ?History of Present Illness: ?Darlene [name] is a 51 y.o. female who has been referred to clinic today by Darlene Flavin, DO for evaluation of left elbow pain.  Pain is medial.  No prior injury.  No history of dislocation.  Reports landing on her left elbow 20 years ago which required a debridement.  No issues with recovery.  She is not having any numbness or tingling.   ? ? ?Review of Systems: ?No fevers or chills ?No numbness or tingling ?No chest pain ?No shortness of breath ?No bowel or bladder dysfunction ?No GI distress ?No headaches ? ? ?Medical History: ? ?Past Medical History:  ?Diagnosis Date  ? Fatty liver   ? Fatty liver   ? GERD (gastroesophageal reflux disease)   ? Hiatal hernia   ? Hypertension   ? Hypokalemia   ? OA (osteoarthritis)   ? Plantar fasciitis   ? Vitamin D deficiency   ? ? ?Past Surgical History:  ?Procedure Laterality Date  ? CESAREAN SECTION  1993  ? FOOT SURGERY Right 1997  ? INSERTION OF MESH N/A 01/28/2016  ? Procedure: INSERTION OF MESH;  Surgeon: Axel Filler, MD;  Location: [facility];  Service: General;  Laterality: N/A;  ? INSERTION OF MESH  09/06/2018  ? Procedure: INSERTION OF MESH;  Surgeon: Axel Filler, MD;  Location: [facility];  Service: General;;  ? Right Knee Surgery    ? tubal ligation    ? ? ?Family History  ?Problem Relation Age of Onset  ? High blood pressure Mother   ? Diabetes Mother   ? Kidney cancer Mother   ?  Hyperlipidemia Sister   ? Heart disease Sister   ? Heart disease Brother   ? Hyperlipidemia Brother   ? Colon cancer Neg Hx   ? Rectal cancer Neg Hx   ? Stomach cancer Neg Hx   ? ?Social History  ? ?Tobacco Use  ? Smoking status: Never  ? Smokeless tobacco: Never  ?Vaping Use  ? Vaping Use: Never used  ?Substance Use Topics  ? Alcohol use: No  ?  Alcohol/week: 0.0 standard drinks  ? Drug use: No  ? ? ?No Known Allergies ? ?Current Meds  ?Medication Sig  ? albuterol (VENTOLIN HFA) 108 (90 Base) MCG/ACT inhaler INHALE 2 PUFFS BY MOUTH EVERY 4 HOURS AS NEEDED FOR WHEEZE OR FOR SHORTNESS OF BREATH  ? citalopram (CELEXA) 40 MG tablet Take 1 tablet (40 mg total) by mouth daily.  ? cyclobenzaprine (FLEXERIL) 10 MG tablet Take 1 tablet (10 mg total) by mouth 3 (three) times daily as needed for muscle spasms.  ? gabapentin (NEURONTIN) 100 MG capsule Take 1 capsule (100 mg total) by mouth 2 (two) times daily.  ? hydrochlorothiazide (HYDRODIURIL) 25 MG tablet TAKE 1 TABLET (25 MG TOTAL) BY MOUTH DAILY WITH BREAKFAST.  ? ibuprofen (ADVIL) 800 MG tablet  TAKE 1 TABLET BY MOUTH THREE TIMES A DAY  ? loratadine (CLARITIN) 10 MG tablet TAKE 1 TABLET BY MOUTH EVERY DAY  ? metoprolol succinate (TOPROL-XL) 25 MG 24 hr tablet TAKE 1 TABLET (25 MG TOTAL) BY MOUTH DAILY.  ? pantoprazole (PROTONIX) 40 MG tablet Take 1 tablet (40 mg total) by mouth 2 (two) times daily.  ? Plecanatide (TRULANCE) 3 MG TABS Take 3 mg by mouth daily. For constipation.  Bringing in coupon  ? Potassium Chloride ER 20 MEQ TBCR Take 1 tablet by mouth in the morning and at bedtime. For low potassium  ? ? ?Objective: ?There were no vitals taken for this visit. ? ?Physical Exam: ? ?General: Alert and oriented. and No acute distress. ?Gait: Normal gait. ? ?Left elbow without deformity.  Posterior elbow wound is well healed.  No tenderness.  ROM is full.  Tenderness over the medial epicondyle.  Pain with resisted wrist flexion.  Fingers are warm and well perfused.  2+  radial pulse.  ? ?IMAGING: ?I personally ordered and reviewed the following images ? ? ?New Medications:  ?No orders of the defined types were placed in this encounter. ? ?ER of the left elbow were obtained in clinic today.  No acute injuries needed.  No dislocation.  Well preserved joint space.  Small osteophyte at the olecranon. ? ?Impression: normal left elbow XR ? ? ? ?Oliver Barre, MD ? ?07/25/2021 ?2:34 PM ? ? ?

## 2021-07-25 NOTE — Patient Instructions (Signed)
Recommend voltaren gel, available at the pharmacy ? ?Placed a referral for PT ? ?Can wear current brace as needed ? ?Consider a compression sleeve as well.  ? ?Follow up as needed.  ?

## 2021-07-25 NOTE — Addendum Note (Signed)
Addended by: Brand Males E on: 07/25/2021 02:50 PM ? ? Modules accepted: Orders ? ?

## 2021-07-28 ENCOUNTER — Encounter: Payer: Self-pay | Admitting: Family Medicine

## 2021-08-03 ENCOUNTER — Ambulatory Visit: Payer: BC Managed Care – PPO | Attending: Orthopedic Surgery | Admitting: Physical Therapy

## 2021-08-03 ENCOUNTER — Other Ambulatory Visit: Payer: Self-pay

## 2021-08-03 ENCOUNTER — Encounter: Payer: Self-pay | Admitting: Physical Therapy

## 2021-08-03 DIAGNOSIS — M25522 Pain in left elbow: Secondary | ICD-10-CM | POA: Diagnosis not present

## 2021-08-03 DIAGNOSIS — M7702 Medial epicondylitis, left elbow: Secondary | ICD-10-CM | POA: Diagnosis not present

## 2021-08-03 NOTE — Therapy (Signed)
?[facility] ?[place] ?[place], [place], [address] ?Phone: [phone]   Fax:  [phone] ? ?Physical Therapy Evaluation ? ?Patient Details  ?Name: Darlene [name] ?MRN: [id number] ?Date of Birth: February 02, 1971 ?Referring Provider (PT): Larena Glassman MD ? ? ?Encounter Date: 08/03/2021 ? ? PT End of Session - 08/03/21 0936   ? ? Visit Number 1   ? Number of Visits 6   ? Date for PT Re-Evaluation 09/14/21   ? Authorization Type FOTO.   ? PT Start Time 0815   ? PT Stop Time 0902   ? PT Time Calculation (min) 47 min   ? Activity Tolerance Patient tolerated treatment well   ? Behavior During Therapy [facility] for tasks assessed/performed   ? ?  ?  ? ?  ? ? ?Past Medical History:  ?Diagnosis Date  ? Fatty liver   ? Fatty liver   ? GERD (gastroesophageal reflux disease)   ? Hiatal hernia   ? Hypertension   ? Hypokalemia   ? OA (osteoarthritis)   ? Plantar fasciitis   ? Vitamin D deficiency   ? ? ?Past Surgical History:  ?Procedure Laterality Date  ? [place]  ? FOOT SURGERY Right 1997  ? INSERTION OF MESH N/A 01/28/2016  ? Procedure: INSERTION OF MESH;  Surgeon: Ralene Ok, MD;  Location: [facility];  Service: General;  Laterality: N/A;  ? INSERTION OF MESH  09/06/2018  ? Procedure: INSERTION OF MESH;  Surgeon: Ralene Ok, MD;  Location: [facility];  Service: General;;  ? Right Knee Surgery    ? tubal ligation    ? ? ?There were no vitals filed for this visit. ? ? ? Subjective Assessment - 08/03/21 0910   ? ? Subjective COVID-19 screen performed prior to patient entering clinic.  The patient presents to the clinic today with c/o left elbow pain that can become severe with a lot of repetitve left hand movements.  Her pain is very low at rest today.  This has been ongoing for about 6 months.  She had an elbow injury about 20 years ago but this resolved.   ? Pertinent History OA, HTN, Right foot an dhernia surgery.   ? Patient Stated Goals Use left hand without pain.   ?  Currently in Pain? Yes   ? Pain Score 1    ? Pain Location Elbow   ? Pain Orientation Left   ? Pain Descriptors / Indicators Aching;Dull   ? Pain Type Chronic pain   ? Pain Onset More than a month ago   ? Aggravating Factors  See above.   ? Pain Relieving Factors Not using left hand.   ? ?  ?  ? ?  ? ? ? ? ? [facility] PT Assessment - 08/03/21 0001   ? ?  ? Assessment  ? Medical Diagnosis Left elbow medial epicondylitis   ? Referring Provider (PT) Larena Glassman MD   ? Onset Date/Surgical Date --   ~6 months.  ? Hand Dominance Right   ?  ? Precautions  ? Precautions None   ?  ? Restrictions  ? Weight Bearing Restrictions No   ?  ? Balance Screen  ? Has the patient fallen in the past 6 months No   ? Has the patient had a decrease in activity level because of a fear of falling?  No   ? Is the patient reluctant to leave their home because of a fear of falling?  No   ?  ? Home Environment  ? Living Environment Private residence   ?  ? Prior Function  ? Level of Independence Independent   ?  ? Observation/Other Assessments  ? Focus on Therapeutic Outcomes (FOTO)  Complete.   ?  ? ROM / Strength  ? AROM / PROM / Strength AROM;Strength   ?  ? AROM  ? Overall AROM Comments Full left elbow, forearm and wrist range of motion.   ?  ? Strength  ? Overall Strength Comments Normal left elbow strength.  Grip is 35# on right and 30# on left.   ?  ? Palpation  ? Palpation comment Tender to palpation over patient's left medial epicondyle.   ? ?  ?  ? ?  ? ? ? ? ? ? ? ? ? ? ? ? ? ?Objective measurements completed on examination: See above findings.  ? ? ? ? ? [facility] Adult PT Treatment/Exercise - 08/03/21 0001   ? ?  ? Modalities  ? Modalities Electrical Stimulation   ?  ? Electrical Stimulation  ? Electrical Stimulation Location Left medial elbow.   ? Electrical Stimulation Action Pre-mod.   ? Electrical Stimulation Parameters 80-150 Hz. x 20 minutes.   ? Electrical Stimulation Goals Pain   ? ?  ?  ? ?  ? ? ? ? ? ? ? ? ? ? ? ? ? ? ? PT Long Term  Goals - 08/03/21 0939   ? ?  ? PT LONG TERM GOAL #1  ? Title Independent with a HEP.   ? Time 6   ? Period Weeks   ? Status New   ?  ? PT LONG TERM GOAL #2  ? Title Perform ADL's with left elbow pain not > 2-3/10.   ? Time 6   ? Period Weeks   ? Status New   ? ?  ?  ? ?  ? ? ? ? ? ? ? ? ? Plan - 08/03/21 0926   ? ? Clinical Impression Statement The patient presents to [facility] with c/o left elbow pain that has been ongoing for about 6 months.  Repetitve left elbow movements can produce severe pain.  She has full range of motion and normal strength.  She is tender is to palpation over her left medial epicondyle.  Patient will benefit from skilled physical therapy intervention to address pain and deficits.   ? Personal Factors and Comorbidities Comorbidity 1;Other   ? Examination-Activity Limitations Other   ? Examination-Participation Restrictions Other;Valla Leaver Work   ? Stability/Clinical Decision Making Stable/Uncomplicated   ? Rehab Potential Excellent   ? PT Frequency 1x / week   ? PT Duration 6 weeks   ? PT Treatment/Interventions ADLs/Self Care Home Management;Cryotherapy;Electrical Stimulation;Ultrasound;Moist Heat;Iontophoresis 4mg /ml Dexamethasone;Therapeutic activities;Therapeutic exercise;Manual techniques;Patient/family education;Passive range of motion;Dry needling   ? PT Next Visit Plan Pulsed combo e'stim/US; STW/M, IASTM, theraband resisted pain-free left elbow flexion for HEP.   ? Consulted and Agree with Plan of Care Patient   ? ?  ?  ? ?  ? ? ?Patient will benefit from skilled therapeutic intervention in order to improve the following deficits and impairments:  Pain, Decreased activity tolerance ? ?Visit Diagnosis: ?Pain in left elbow - Plan: PT plan of care cert/re-cert ? ? ? ? ?Problem List ?Patient Active Problem List  ? Diagnosis Date Noted  ? S/P Nissen fundoplication (without gastrostomy tube) procedure 09/06/2018  ? Spondylosis of lumbar region without myelopathy or radiculopathy 03/13/2018  ?  Plantar fasciitis 03/13/2018  ? Generalized OA 03/13/2018  ? Tic disorder 03/23/2017  ? Paroxysmal supraventricular tachycardia ([place]) 08/02/2016  ? GAD (generalized anxiety disorder) 08/02/2016  ? Fatty liver 03/16/2016  ? S/P Nissen fundoplication (with gastrostomy tube placement) ([place]) 01/28/2016  ? Allergic rhinitis 01/03/2016  ? Edema 01/03/2016  ? Hypokalemia 01/03/2016  ? Hiatal hernia 01/03/2016  ? Esophageal reflux 01/03/2016  ? Body mass index 34.0-34.9, adult 01/03/2016  ? ? ?Donshay Lupinski, [place], PT ?08/03/2021, 9:41 AM ? ?[place] ?[facility] ?[place] ?[place], [place], [address] ?Phone: [phone]   Fax:  [phone] ? ?Name: Darlene [name] ?MRN: [id number] ?Date of Birth: 1970/12/13 ? ? ?

## 2021-08-10 ENCOUNTER — Ambulatory Visit: Payer: BC Managed Care – PPO | Admitting: Physical Therapy

## 2021-08-10 ENCOUNTER — Other Ambulatory Visit: Payer: Self-pay

## 2021-08-10 DIAGNOSIS — M25522 Pain in left elbow: Secondary | ICD-10-CM

## 2021-08-10 DIAGNOSIS — M7702 Medial epicondylitis, left elbow: Secondary | ICD-10-CM | POA: Diagnosis not present

## 2021-08-10 NOTE — Therapy (Signed)
[place] ?[facility] ?[address] ?[place], [place], [address] ?Phone: [phone]   Fax:  [phone] ? ?Physical Therapy Treatment ? ?Patient Details  ?Name: Darlene [name] ?MRN: [id number] ?Date of Birth: 05/28/1970 ?Referring Provider (PT): Thane Edu MD ? ? ?Encounter Date: 08/10/2021 ? ? PT End of Session - 08/10/21 1042   ? ? Visit Number 2   ? Number of Visits 6   ? Date for PT Re-Evaluation 09/14/21   ? Authorization Type FOTO.   ? PT Start Time 0900   ? PT Stop Time 0950   ? PT Time Calculation (min) 50 min   ? Activity Tolerance Patient tolerated treatment well   ? Behavior During Therapy [facility] for tasks assessed/performed   ? ?  ?  ? ?  ? ? ?Past Medical History:  ?Diagnosis Date  ? Fatty liver   ? Fatty liver   ? GERD (gastroesophageal reflux disease)   ? Hiatal hernia   ? Hypertension   ? Hypokalemia   ? OA (osteoarthritis)   ? Plantar fasciitis   ? Vitamin D deficiency   ? ? ?Past Surgical History:  ?Procedure Laterality Date  ? CESAREAN SECTION  1993  ? FOOT SURGERY Right 1997  ? INSERTION OF MESH N/A 01/28/2016  ? Procedure: INSERTION OF MESH;  Surgeon: Axel Filler, MD;  [facility];  Service: General;  Laterality: N/A;  ? INSERTION OF MESH  09/06/2018  ? Procedure: INSERTION OF MESH;  Surgeon: Axel Filler, MD;  [facility];  Service: General;;  ? Right Knee Surgery    ? tubal ligation    ? ? ?There were no vitals filed for this visit. ? ? Subjective Assessment - 08/10/21 1044   ? ? Subjective No new complaints.   ? Pertinent History OA, HTN, Right foot and hernia surgery.   ? Patient Stated Goals Use left hand without pain.   ? Currently in Pain? Yes   ? Pain Score 1    ? Pain Location Elbow   ? Pain Orientation Left   ? Pain Descriptors / Indicators Aching   ? Pain Type Chronic pain   ? Pain Onset More than a month ago   ? ?  ?  ? ?  ? ? ? ? ? ? ? ? ? ? ? ? ? ? ? ? ? ? ? ? [facility] Adult PT Treatment/Exercise - 08/10/21 0001   ? ?  ? Modalities  ?  Modalities Electrical Stimulation;Vasopneumatic;Ultrasound   ?  ? Electrical Stimulation  ? Electrical Stimulation Location Left medial elbow region.   ? Electrical Stimulation Action Pre-mod.   ? Electrical Stimulation Parameters 80-150 Hz. x 15 minutes.   ? Electrical Stimulation Goals Pain   ?  ? Ultrasound  ? Ultrasound Location Left medial elbow.   ? Ultrasound Parameters Small soundhead combo e'stim/US at 1.50 W/CM2 x 12 minutes 50% at 3.3 mHz.   ?  ? Manual Therapy  ? Manual Therapy Soft tissue mobilization   ? Soft tissue mobilization STW/M including light IASTM to medial elbow and flexor musculature x 12 minutes.   ? ?  ?  ? ?  ? ? ? ? ? ? ? ? ? ? ? ? ? ? ? PT Long Term Goals - 08/03/21 0939   ? ?  ? PT LONG TERM GOAL #1  ? Title Independent with a HEP.   ? Time 6   ? Period Weeks   ? Status New   ?  ?  PT LONG TERM GOAL #2  ? Title Perform ADL's with left elbow pain not > 2-3/10.   ? Time 6   ? Period Weeks   ? Status New   ? ?  ?  ? ?  ? ? ? ? ? ? ? ? Plan - 08/10/21 1053   ? ? Clinical Impression Statement The patient did very well with treatment today.  Continued tenderness over left medial epicondylar area but very good resposne to tretament today.   ? Personal Factors and Comorbidities Comorbidity 1;Other   ? Examination-Activity Limitations Other   ? Examination-Participation Restrictions Other;Pincus Badder Work   ? Stability/Clinical Decision Making Stable/Uncomplicated   ? Rehab Potential Excellent   ? PT Frequency 1x / week   ? PT Duration 6 weeks   ? PT Treatment/Interventions ADLs/Self Care Home Management;Cryotherapy;Electrical Stimulation;Ultrasound;Moist Heat;Iontophoresis 4mg /ml Dexamethasone;Therapeutic activities;Therapeutic exercise;Manual techniques;Patient/family education;Passive range of motion;Dry needling   ? PT Next Visit Plan Pulsed combo e'stim/US; STW/M, IASTM, theraband resisted pain-free left elbow flexion for HEP.   ? Consulted and Agree with Plan of Care Patient   ? ?  ?  ? ?   ? ? ?Patient will benefit from skilled therapeutic intervention in order to improve the following deficits and impairments:  Pain, Decreased activity tolerance ? ?Visit Diagnosis: ?Pain in left elbow ? ? ? ? ?Problem List ?Patient Active Problem List  ? Diagnosis Date Noted  ? S/P Nissen fundoplication (without gastrostomy tube) procedure 09/06/2018  ? Spondylosis of lumbar region without myelopathy or radiculopathy 03/13/2018  ? Plantar fasciitis 03/13/2018  ? Generalized OA 03/13/2018  ? Tic disorder 03/23/2017  ? Paroxysmal supraventricular tachycardia (HCC) 08/02/2016  ? GAD (generalized anxiety disorder) 08/02/2016  ? Fatty liver 03/16/2016  ? S/P Nissen fundoplication (with gastrostomy tube placement) (HCC) 01/28/2016  ? Allergic rhinitis 01/03/2016  ? Edema 01/03/2016  ? Hypokalemia 01/03/2016  ? Hiatal hernia 01/03/2016  ? Esophageal reflux 01/03/2016  ? Body mass index 34.0-34.9, adult 01/03/2016  ? ? ?Darlene [name], 01/05/2016, PT ?08/10/2021, 11:53 AM ? ?[place] ?[facility] ?[address] ?[place], [place], [place] ?Phone: [phone]   Fax:  [phone] ? ?Name: Darlene [name] ?MRN: Sandi Carne ?Date of Birth: 03-30-1971 ? ? ? ?

## 2021-08-17 ENCOUNTER — Encounter: Payer: BC Managed Care – PPO | Admitting: Physical Therapy

## 2021-08-22 ENCOUNTER — Ambulatory Visit: Payer: BC Managed Care – PPO | Admitting: Orthopedic Surgery

## 2021-08-23 ENCOUNTER — Ambulatory Visit: Payer: BC Managed Care – PPO | Attending: Orthopedic Surgery | Admitting: Physical Therapy

## 2021-08-23 DIAGNOSIS — M25522 Pain in left elbow: Secondary | ICD-10-CM | POA: Insufficient documentation

## 2021-08-23 NOTE — Therapy (Signed)
[place] ?[facility] ?[address] ?[place], [place], [address] ?Phone: [phone]   Fax:  [phone] ? ?Physical Therapy Treatment ? ?Patient Details  ?Name: Darlene [name] ?MRN: [id number] ?Date of Birth: [birth year] ?Referring Provider (PT): Thane Edu MD ? ? ?Encounter Date: 08/23/2021 ? ? PT End of Session - 08/23/21 0935   ? ? Visit Number 3   ? Number of Visits 6   ? Date for PT Re-Evaluation 09/14/21   ? Authorization Type FOTO.   ? PT Start Time 0900   ? PT Stop Time [phone]   ? PT Time Calculation (min) 52 min   ? Activity Tolerance Patient tolerated treatment well   ? Behavior During Therapy [facility] for tasks assessed/performed   ? ?  ?  ? ?  ? ? ?Past Medical History:  ?Diagnosis Date  ? Fatty liver   ? Fatty liver   ? GERD (gastroesophageal reflux disease)   ? Hiatal hernia   ? Hypertension   ? Hypokalemia   ? OA (osteoarthritis)   ? Plantar fasciitis   ? Vitamin D deficiency   ? ? ?Past Surgical History:  ?Procedure Laterality Date  ? CESAREAN SECTION  1993  ? FOOT SURGERY Right 1997  ? INSERTION OF MESH N/A 01/28/2016  ? Procedure: INSERTION OF MESH;  Surgeon: Axel Filler, MD;  [facility]: [facility];  Service: General;  Laterality: N/A;  ? INSERTION OF MESH  09/06/2018  ? Procedure: INSERTION OF MESH;  Surgeon: Axel Filler, MD;  Location: [facility];  Service: General;;  ? Right Knee Surgery    ? tubal ligation    ? ? ?There were no vitals filed for this visit. ? ? Subjective Assessment - 08/23/21 0933   ? ? Subjective Sore after last tretament but better now.   ? Pertinent History OA, HTN, Right foot and hernia surgery.   ? Patient Stated Goals Use left hand without pain.   ? Currently in Pain? Yes   ? Pain Score 1    ? Pain Location Elbow   ? Pain Orientation Left   ? Pain Descriptors / Indicators Dull   ? Pain Type Chronic pain   ? Pain Onset More than a month ago   ? ?  ?  ? ?  ? ? ? ? ? ? ? ? ? ? ? ? ? ? ? ? ? ? ? ? [facility] Adult PT Treatment/Exercise - 08/23/21 0001    ? ?  ? Modalities  ? Modalities Electrical Stimulation;Ultrasound   ?  ? Electrical Stimulation  ? Electrical Stimulation Location Left medial elbow.   ? Electrical Stimulation Action Pre-mod.   ? Electrical Stimulation Parameters 80-150 Hz. at 40% scan x 20 minutes.   ? Electrical Stimulation Goals Pain   ?  ? Ultrasound  ? Ultrasound Location Left medial elbow.   ? Ultrasound Parameters Combo e'stim/US at 1.50 W/CM2 x 12 minutes to patient's left medial epicondylar and proximal wrist flexor musculature.   ?  ? Manual Therapy  ? Manual Therapy Soft tissue mobilization   ? Soft tissue mobilization STW/M x 11 minutes   ? ?  ?  ? ?  ? ? ? ? ? ? ? ? ? ? ? ? ? ? ? PT Long Term Goals - 08/03/21 0939   ? ?  ? PT LONG TERM GOAL #1  ? Title Independent with a HEP.   ? Time 6   ? Period Weeks   ? Status  New   ?  ? PT LONG TERM GOAL #2  ? Title Perform ADL's with left elbow pain not > 2-3/10.   ? Time 6   ? Period Weeks   ? Status New   ? ?  ?  ? ?  ? ? ? ? ? ? ? ? Plan - 08/23/21 0939   ? ? Clinical Impression Statement Great response to treatment thus far.  Sore after last treatment but much better today.   ? Personal Factors and Comorbidities Comorbidity 1;Other   ? Examination-Activity Limitations Other   ? Examination-Participation Restrictions Other;Pincus Badder Work   ? Stability/Clinical Decision Making Stable/Uncomplicated   ? Rehab Potential Excellent   ? PT Frequency 1x / week   ? PT Duration 6 weeks   ? PT Treatment/Interventions ADLs/Self Care Home Management;Cryotherapy;Electrical Stimulation;Ultrasound;Moist Heat;Iontophoresis 4mg /ml Dexamethasone;Therapeutic activities;Therapeutic exercise;Manual techniques;Patient/family education;Passive range of motion;Dry needling   ? PT Next Visit Plan Pulsed combo e'stim/US; STW/M, IASTM, theraband resisted pain-free left elbow flexion for HEP.   ? Consulted and Agree with Plan of Care Patient   ? ?  ?  ? ?  ? ? ?Patient will benefit from skilled therapeutic intervention in  order to improve the following deficits and impairments:  Pain, Decreased activity tolerance ? ?Visit Diagnosis: ?Pain in left elbow ? ? ? ? ?Problem List ?Patient Active Problem List  ? Diagnosis Date Noted  ? S/P Nissen fundoplication (without gastrostomy tube) procedure 09/06/2018  ? Spondylosis of lumbar region without myelopathy or radiculopathy 03/13/2018  ? Plantar fasciitis 03/13/2018  ? Generalized OA 03/13/2018  ? Tic disorder 03/23/2017  ? Paroxysmal supraventricular tachycardia (HCC) 08/02/2016  ? GAD (generalized anxiety disorder) 08/02/2016  ? Fatty liver 03/16/2016  ? S/P Nissen fundoplication (with gastrostomy tube placement) (HCC) 01/28/2016  ? Allergic rhinitis 01/03/2016  ? Edema 01/03/2016  ? Hypokalemia 01/03/2016  ? Hiatal hernia 01/03/2016  ? Esophageal reflux 01/03/2016  ? Body mass index 34.0-34.9, adult 01/03/2016  ? ? ?Darlene [name], 01/05/2016, PT ?08/23/2021, 10:28 AM ? ?[place] ?[facility] ?[address] ?[place], [place], [place] ?Phone: [phone]   Fax:  [phone] ? ?Name: Darlene [name] ?MRN: Sandi Carne ?Date of Birth: Sep 26, 1970 ? ? ? ?

## 2021-08-25 ENCOUNTER — Ambulatory Visit: Payer: BC Managed Care – PPO | Admitting: Physical Therapy

## 2021-08-25 DIAGNOSIS — M25522 Pain in left elbow: Secondary | ICD-10-CM | POA: Diagnosis not present

## 2021-08-25 NOTE — Therapy (Signed)
[place] ?[facility] ?[address] ?[place], [place], [address] ?Phone: [phone]   Fax:  [phone] ? ?Physical Therapy Treatment ? ?Patient Details  ?Name: Darlene [name] ?MRN: [id number] ?Date of Birth: 09-Oct-1970 ?Referring Provider (PT): Thane Edu MD ? ? ?Encounter Date: 08/25/2021 ? ? PT End of Session - 08/25/21 0943   ? ? Visit Number 4   ? Number of Visits 6   ? Date for PT Re-Evaluation 09/14/21   ? Authorization Type FOTO.   ? PT Start Time 0908   ? PT Stop Time 1003   ? PT Time Calculation (min) 55 min   ? Activity Tolerance Patient tolerated treatment well   ? Behavior During Therapy [facility] for tasks assessed/performed   ? ?  ?  ? ?  ? ? ?Past Medical History:  ?Diagnosis Date  ? Fatty liver   ? Fatty liver   ? GERD (gastroesophageal reflux disease)   ? Hiatal hernia   ? Hypertension   ? Hypokalemia   ? OA (osteoarthritis)   ? Plantar fasciitis   ? Vitamin D deficiency   ? ? ?Past Surgical History:  ?Procedure Laterality Date  ? CESAREAN SECTION  1993  ? FOOT SURGERY Right 1997  ? INSERTION OF MESH N/A 01/28/2016  ? Procedure: INSERTION OF MESH;  Surgeon: Axel Filler, MD;  [facility];  Service: General;  Laterality: N/A;  ? INSERTION OF MESH  09/06/2018  ? Procedure: INSERTION OF MESH;  Surgeon: Axel Filler, MD;  Location: [facility];  Service: General;;  ? Right Knee Surgery    ? tubal ligation    ? ? ?There were no vitals filed for this visit. ? ? Subjective Assessment - 08/25/21 0944   ? ? Subjective Much better.   ? Pertinent History OA, HTN, Right foot and hernia surgery.   ? Patient Stated Goals Use left hand without pain.   ? Currently in Pain? Yes   ? Pain Score 1    ? Pain Location Elbow   ? Pain Orientation Left   ? Pain Descriptors / Indicators Dull   ? Pain Type Chronic pain   ? Pain Onset More than a month ago   ? ?  ?  ? ?  ? ? ? ? ? ? ? ? ? ? ? ? ? ? ? ? ? ? ? ? [facility] Adult PT Treatment/Exercise - 08/25/21 0001   ? ?  ? Modalities  ? Modalities  Electrical Stimulation;Ultrasound   ?  ? Electrical Stimulation  ? Electrical Stimulation Location Left medial elbow.   ? Electrical Stimulation Action Pre-mod.   ? Electrical Stimulation Parameters 80-150 Hz.   ? Electrical Stimulation Goals Pain   ?  ? Ultrasound  ? Ultrasound Location Left medial elbow.   ? Ultrasound Parameters Small soundhead:  Combo e'stim/US at 1.50 W/CM2 x  at 50% and 1.50 W/CM2 x 12 minutes.   ?  ? Manual Therapy  ? Manual Therapy Soft tissue mobilization   ? Soft tissue mobilization STW/M x 11 minutes to patient's left medial epicondylar regiona nd proximal flexor musculature   ? ?  ?  ? ?  ? ? ? ? ? ? ? ? ? ? ? ? ? ? ? PT Long Term Goals - 08/03/21 0939   ? ?  ? PT LONG TERM GOAL #1  ? Title Independent with a HEP.   ? Time 6   ? Period Weeks   ? Status New   ?  ?  PT LONG TERM GOAL #2  ? Title Perform ADL's with left elbow pain not > 2-3/10.   ? Time 6   ? Period Weeks   ? Status New   ? ?  ?  ? ?  ? ? ? ? ? ? ? ? Plan - 08/25/21 0956   ? ? Clinical Impression Statement The patient is very pleased with her progress.  Patient reporting no pain after treatment.   ? Personal Factors and Comorbidities Comorbidity 1;Other   ? Examination-Activity Limitations Other   ? Examination-Participation Restrictions Other;Pincus Badder Work   ? Stability/Clinical Decision Making Stable/Uncomplicated   ? Rehab Potential Excellent   ? PT Frequency 1x / week   ? PT Duration 6 weeks   ? PT Treatment/Interventions ADLs/Self Care Home Management;Cryotherapy;Electrical Stimulation;Ultrasound;Moist Heat;Iontophoresis 4mg /ml Dexamethasone;Therapeutic activities;Therapeutic exercise;Manual techniques;Patient/family education;Passive range of motion;Dry needling   ? PT Next Visit Plan Pulsed combo e'stim/US; STW/M, IASTM, theraband resisted pain-free left elbow flexion for HEP.   ? Consulted and Agree with Plan of Care Patient   ? ?  ?  ? ?  ? ? ?Patient will benefit from skilled therapeutic intervention in order to  improve the following deficits and impairments:  Pain, Decreased activity tolerance ? ?Visit Diagnosis: ?Pain in left elbow ? ? ? ? ?Problem List ?Patient Active Problem List  ? Diagnosis Date Noted  ? S/P Nissen fundoplication (without gastrostomy tube) procedure 09/06/2018  ? Spondylosis of lumbar region without myelopathy or radiculopathy 03/13/2018  ? Plantar fasciitis 03/13/2018  ? Generalized OA 03/13/2018  ? Tic disorder 03/23/2017  ? Paroxysmal supraventricular tachycardia (HCC) 08/02/2016  ? GAD (generalized anxiety disorder) 08/02/2016  ? Fatty liver 03/16/2016  ? S/P Nissen fundoplication (with gastrostomy tube placement) (HCC) 01/28/2016  ? Allergic rhinitis 01/03/2016  ? Edema 01/03/2016  ? Hypokalemia 01/03/2016  ? Hiatal hernia 01/03/2016  ? Esophageal reflux 01/03/2016  ? Body mass index 34.0-34.9, adult 01/03/2016  ? ? ?Lucianna Ostlund, 01/05/2016, PT ?08/25/2021, 10:08 AM ? ?[place] ?[facility] ?[address] ?[place], [place], [place] ?Phone: [phone]   Fax:  [phone] ? ?Name: Jillayne Witte ?MRN: Sandi Carne ?Date of Birth: 1971-02-19 ? ? ? ?

## 2021-08-28 ENCOUNTER — Encounter: Payer: Self-pay | Admitting: Family Medicine

## 2021-08-29 ENCOUNTER — Encounter: Payer: Self-pay | Admitting: Family Medicine

## 2021-08-29 ENCOUNTER — Ambulatory Visit: Payer: BC Managed Care – PPO | Admitting: Physical Therapy

## 2021-08-29 ENCOUNTER — Other Ambulatory Visit: Payer: Self-pay | Admitting: Family Medicine

## 2021-08-29 DIAGNOSIS — M25522 Pain in left elbow: Secondary | ICD-10-CM

## 2021-08-29 MED ORDER — CYCLOBENZAPRINE HCL 10 MG PO TABS: 10.0000 mg | ORAL_TABLET | Freq: Three times a day (TID) | ORAL | 2 refills | Status: DC | PRN

## 2021-08-29 NOTE — Therapy (Signed)
[place] ?[facility] ?[address] ?[place], [place], [address] ?Phone: [phone]   Fax:  [phone] ? ?Physical Therapy Treatment ? ?Patient Details  ?Name: Darlene [name] ?MRN: [id number] ?Date of Birth: [birth year] ?Referring Provider (PT): Thane Edu MD ? ? ?Encounter Date: 08/29/2021 ? ? PT End of Session - 08/29/21 0913   ? ? Visit Number 5   ? Number of Visits 6   ? Date for PT Re-Evaluation 09/14/21   ? Authorization Type FOTO.   ? PT Start Time 0818   ? PT Stop Time 0909   ? PT Time Calculation (min) 51 min   ? Activity Tolerance Patient tolerated treatment well   ? Behavior During Therapy [facility] for tasks assessed/performed   ? ?  ?  ? ?  ? ? ?Past Medical History:  ?Diagnosis Date  ? Fatty liver   ? Fatty liver   ? GERD (gastroesophageal reflux disease)   ? Hiatal hernia   ? Hypertension   ? Hypokalemia   ? OA (osteoarthritis)   ? Plantar fasciitis   ? Vitamin D deficiency   ? ? ?Past Surgical History:  ?Procedure Laterality Date  ? CESAREAN SECTION  1993  ? FOOT SURGERY Right 1997  ? INSERTION OF MESH N/A 01/28/2016  ? Procedure: INSERTION OF MESH;  Surgeon: Axel Filler, MD;  Location: [facility];  Service: General;  Laterality: N/A;  ? INSERTION OF MESH  09/06/2018  ? Procedure: INSERTION OF MESH;  Surgeon: Axel Filler, MD;  Location: [facility];  Service: General;;  ? Right Knee Surgery    ? tubal ligation    ? ? ?There were no vitals filed for this visit. ? ? Subjective Assessment - 08/29/21 0913   ? ? Subjective Did great over the weekend.   ? Pertinent History OA, HTN, Right foot and hernia surgery.   ? Patient Stated Goals Use left hand without pain.   ? Currently in Pain? Yes   ? Pain Score 1    ? Pain Location Elbow   ? Pain Orientation Left   ? Pain Descriptors / Indicators Dull   ? Pain Onset More than a month ago   ? ?  ?  ? ?  ? ? ? ? ? ? ? ? ? ? ? ? ? ? ? ? ? ? ? ? [facility] Adult PT Treatment/Exercise - 08/29/21 0001   ? ?  ? Modalities  ? Modalities Electrical  Stimulation;Ultrasound   ?  ? Electrical Stimulation  ? Electrical Stimulation Location Left medial elbow.   ? Electrical Stimulation Action Pre-mod.   ? Electrical Stimulation Parameters 80-150 Hz. 20 minutes.   ? Electrical Stimulation Goals Pain   ?  ? Ultrasound  ? Ultrasound Location Left medial elbow.   ? Ultrasound Parameters Small soundhead:  Combo e'stim/US at 1.50 W/CM2 x 12 minutes at 3.3 mHz at 50%.   ? Ultrasound Goals Pain   ?  ? Manual Therapy  ? Manual Therapy Soft tissue mobilization   ? Soft tissue mobilization STW/M x  to patient's left medial epicondylar and proximal flexor musculature x 11 minutes.   ? ?  ?  ? ?  ? ? ? ? ? ? ? ? ? ? ? ? ? ? ? PT Long Term Goals - 08/03/21 0939   ? ?  ? PT LONG TERM GOAL #1  ? Title Independent with a HEP.   ? Time 6   ? Period Weeks   ?  Status New   ?  ? PT LONG TERM GOAL #2  ? Title Perform ADL's with left elbow pain not > 2-3/10.   ? Time 6   ? Period Weeks   ? Status New   ? ?  ?  ? ?  ? ? ? ? ? ? ? ? Plan - 08/29/21 1001   ? ? Clinical Impression Statement Patient a very good weekend with regards to her left elbow pain.  She has responded very well to treatments and reported no pain after today's session.   ? Personal Factors and Comorbidities Comorbidity 1;Other   ? Examination-Activity Limitations Other   ? Examination-Participation Restrictions Other;Pincus Badder Work   ? Stability/Clinical Decision Making Stable/Uncomplicated   ? Rehab Potential Excellent   ? PT Frequency 1x / week   ? PT Duration 6 weeks   ? PT Treatment/Interventions ADLs/Self Care Home Management;Cryotherapy;Electrical Stimulation;Ultrasound;Moist Heat;Iontophoresis 4mg /ml Dexamethasone;Therapeutic activities;Therapeutic exercise;Manual techniques;Patient/family education;Passive range of motion;Dry needling   ? PT Next Visit Plan Pulsed combo e'stim/US; STW/M, IASTM, theraband resisted pain-free left elbow flexion for HEP.   ? Consulted and Agree with Plan of Care Patient   ? ?  ?  ? ?   ? ? ?Patient will benefit from skilled therapeutic intervention in order to improve the following deficits and impairments:  Pain, Decreased activity tolerance ? ?Visit Diagnosis: ?Pain in left elbow ? ? ? ? ?Problem List ?Patient Active Problem List  ? Diagnosis Date Noted  ? S/P Nissen fundoplication (without gastrostomy tube) procedure 09/06/2018  ? Spondylosis of lumbar region without myelopathy or radiculopathy 03/13/2018  ? Plantar fasciitis 03/13/2018  ? Generalized OA 03/13/2018  ? Tic disorder 03/23/2017  ? Paroxysmal supraventricular tachycardia (HCC) 08/02/2016  ? GAD (generalized anxiety disorder) 08/02/2016  ? Fatty liver 03/16/2016  ? S/P Nissen fundoplication (with gastrostomy tube placement) (HCC) 01/28/2016  ? Allergic rhinitis 01/03/2016  ? Edema 01/03/2016  ? Hypokalemia 01/03/2016  ? Hiatal hernia 01/03/2016  ? Esophageal reflux 01/03/2016  ? Body mass index 34.0-34.9, adult 01/03/2016  ? ? ?Darlene [name], 01/05/2016, PT ?08/29/2021, 10:04 AM ? ?[place] ?[facility] ?[address] ?[place], [place], [place] ?Phone: [phone]   Fax:  [phone] ? ?Name: Darlene [name] ?MRN: Sandi Carne ?Date of Birth: 1971-02-22 ? ? ? ?

## 2021-08-31 ENCOUNTER — Institutional Professional Consult (permissible substitution): Payer: BC Managed Care – PPO | Admitting: Neurology

## 2021-09-01 ENCOUNTER — Other Ambulatory Visit: Payer: Self-pay | Admitting: Family Medicine

## 2021-09-01 DIAGNOSIS — K219 Gastro-esophageal reflux disease without esophagitis: Secondary | ICD-10-CM

## 2021-09-02 MED ORDER — PANTOPRAZOLE SODIUM 40 MG PO TBEC: 40.0000 mg | DELAYED_RELEASE_TABLET | Freq: Two times a day (BID) | ORAL | 0 refills | Status: DC

## 2021-09-04 ENCOUNTER — Other Ambulatory Visit: Payer: Self-pay | Admitting: Family Medicine

## 2021-09-04 DIAGNOSIS — I471 Supraventricular tachycardia: Secondary | ICD-10-CM

## 2021-09-07 ENCOUNTER — Ambulatory Visit: Payer: BC Managed Care – PPO | Admitting: Physical Therapy

## 2021-09-07 DIAGNOSIS — M25522 Pain in left elbow: Secondary | ICD-10-CM | POA: Diagnosis not present

## 2021-09-07 DIAGNOSIS — R109 Unspecified abdominal pain: Secondary | ICD-10-CM | POA: Diagnosis not present

## 2021-09-07 DIAGNOSIS — K760 Fatty (change of) liver, not elsewhere classified: Secondary | ICD-10-CM | POA: Diagnosis not present

## 2021-09-07 DIAGNOSIS — I10 Essential (primary) hypertension: Secondary | ICD-10-CM | POA: Diagnosis not present

## 2021-09-07 DIAGNOSIS — K449 Diaphragmatic hernia without obstruction or gangrene: Secondary | ICD-10-CM | POA: Diagnosis not present

## 2021-09-07 DIAGNOSIS — Z79899 Other long term (current) drug therapy: Secondary | ICD-10-CM | POA: Diagnosis not present

## 2021-09-07 DIAGNOSIS — R1013 Epigastric pain: Secondary | ICD-10-CM | POA: Diagnosis not present

## 2021-09-07 NOTE — Therapy (Signed)
[place] ?[facility] ?[address] ?[place], [place], [address] ?Phone: [phone]   Fax:  [phone] ? ?Physical Therapy Treatment ? ?Patient Details  ?Name: Darlene [name] ?MRN: [id number] ?Date of Birth: [birth year] ?Referring Provider (PT): Thane Edu MD ? ? ?Encounter Date: 09/07/2021 ? ? PT End of Session - 09/07/21 1153   ? ? Visit Number 6   ? Number of Visits 8   ? Date for PT Re-Evaluation 09/21/21   ? Authorization Type FOTO.   ? PT Start Time 0818   ? PT Stop Time 0902   ? PT Time Calculation (min) 44 min   ? Activity Tolerance Patient tolerated treatment well   ? Behavior During Therapy [facility] for tasks assessed/performed   ? ?  ?  ? ?  ? ? ?Past Medical History:  ?Diagnosis Date  ? Fatty liver   ? Fatty liver   ? GERD (gastroesophageal reflux disease)   ? Hiatal hernia   ? Hypertension   ? Hypokalemia   ? OA (osteoarthritis)   ? Plantar fasciitis   ? Vitamin D deficiency   ? ? ?Past Surgical History:  ?Procedure Laterality Date  ? CESAREAN SECTION  1993  ? FOOT SURGERY Right 1997  ? INSERTION OF MESH N/A 01/28/2016  ? Procedure: INSERTION OF MESH;  Surgeon: Axel Filler, MD;  Location: [facility];  Service: General;  Laterality: N/A;  ? INSERTION OF MESH  09/06/2018  ? Procedure: INSERTION OF MESH;  Surgeon: Axel Filler, MD;  Location: [facility];  Service: General;;  ? Right Knee Surgery    ? tubal ligation    ? ? ?There were no vitals filed for this visit. ? ? Subjective Assessment - 09/07/21 1112   ? ? Subjective Doing well.   ? Pertinent History OA, HTN, Right foot and hernia surgery.   ? Patient Stated Goals Use left hand without pain.   ? Currently in Pain? Yes   ? Pain Score 1    ? Pain Orientation Left   ? Pain Descriptors / Indicators Dull   ? Pain Type Chronic pain   ? Pain Onset More than a month ago   ? ?  ?  ? ?  ? ? ? ? ? ? ? ? ? ? ? ? ? ? ? ? ? ? ? ? [facility] Adult PT Treatment/Exercise - 09/07/21 0001   ? ?  ? Modalities  ? Modalities Electrical  Stimulation;Ultrasound   ?  ? Electrical Stimulation  ? Electrical Stimulation Location LT Med elbow.   ? Electrical Stimulation Action Pre-mod   ? Electrical Stimulation Parameters 80-150 Hz x 15 minutes.   ? Electrical Stimulation Goals Pain   ?  ? Ultrasound  ? Ultrasound Location LT med elbow.   ? Ultrasound Parameters Combo e'stim/US at 1.50 w/CM2 x 12 minutes at 3.3 mHz at 50%.   ?  ? Manual Therapy  ? Manual Therapy Soft tissue mobilization   ? Soft tissue mobilization STW/M x 11 minutes to patient's left elbow proximal musculature.   ? ?  ?  ? ?  ? ? ? ? ? ? ? ? ? ? ? ? ? ? ? PT Long Term Goals - 08/03/21 0939   ? ?  ? PT LONG TERM GOAL #1  ? Title Independent with a HEP.   ? Time 6   ? Period Weeks   ? Status New   ?  ? PT LONG TERM GOAL #2  ?  Title Perform ADL's with left elbow pain not > 2-3/10.   ? Time 6   ? Period Weeks   ? Status New   ? ?  ?  ? ?  ? ? ? ? ? ? ? ? Plan - 09/07/21 1154   ? ? Clinical Impression Statement No pain reported after treatment.   ? Personal Factors and Comorbidities Comorbidity 1;Other   ? Examination-Activity Limitations Other   ? Examination-Participation Restrictions Other;Pincus Badder Work   ? Stability/Clinical Decision Making Stable/Uncomplicated   ? Rehab Potential Excellent   ? PT Frequency 1x / week   ? PT Treatment/Interventions ADLs/Self Care Home Management;Cryotherapy;Electrical Stimulation;Ultrasound;Moist Heat;Iontophoresis 4mg /ml Dexamethasone;Therapeutic activities;Therapeutic exercise;Manual techniques;Patient/family education;Passive range of motion;Dry needling   ? PT Next Visit Plan Pulsed combo e'stim/US; STW/M, IASTM, theraband resisted pain-free left elbow flexion for HEP.   ? Consulted and Agree with Plan of Care Patient   ? ?  ?  ? ?  ? ? ?Patient will benefit from skilled therapeutic intervention in order to improve the following deficits and impairments:  Pain, Decreased activity tolerance ? ?Visit Diagnosis: ?Pain in left elbow - Plan: PT plan of care  cert/re-cert ? ? ? ? ?Problem List ?Patient Active Problem List  ? Diagnosis Date Noted  ? S/P Nissen fundoplication (without gastrostomy tube) procedure 09/06/2018  ? Spondylosis of lumbar region without myelopathy or radiculopathy 03/13/2018  ? Plantar fasciitis 03/13/2018  ? Generalized OA 03/13/2018  ? Tic disorder 03/23/2017  ? Paroxysmal supraventricular tachycardia (HCC) 08/02/2016  ? GAD (generalized anxiety disorder) 08/02/2016  ? Fatty liver 03/16/2016  ? S/P Nissen fundoplication (with gastrostomy tube placement) (HCC) 01/28/2016  ? Allergic rhinitis 01/03/2016  ? Edema 01/03/2016  ? Hypokalemia 01/03/2016  ? Hiatal hernia 01/03/2016  ? Esophageal reflux 01/03/2016  ? Body mass index 34.0-34.9, adult 01/03/2016  ? ? ?Deonna Krummel, 01/05/2016, PT ?09/07/2021, 12:03 PM ? ?[place] ?[facility] ?[address] ?[place], [place], [place] ?Phone: [phone]   Fax:  [phone] ? ?Name: Jerzy Crotteau ?MRN: Sandi Carne ?Date of Birth: 16-Jul-1970 ? ? ? ?

## 2021-09-08 DIAGNOSIS — R109 Unspecified abdominal pain: Secondary | ICD-10-CM | POA: Diagnosis not present

## 2021-09-12 ENCOUNTER — Other Ambulatory Visit: Payer: Self-pay | Admitting: Family Medicine

## 2021-09-14 ENCOUNTER — Encounter: Payer: Self-pay | Admitting: Physical Therapy

## 2021-09-14 ENCOUNTER — Ambulatory Visit: Payer: BC Managed Care – PPO | Attending: Orthopedic Surgery | Admitting: Physical Therapy

## 2021-09-14 DIAGNOSIS — M25522 Pain in left elbow: Secondary | ICD-10-CM | POA: Insufficient documentation

## 2021-09-14 NOTE — Therapy (Signed)
[place] ?[facility] ?[address] ?[place], [place], [address] ?Phone: [phone]   Fax:  [phone] ? ?Physical Therapy Treatment ? ?Patient Details  ?Name: Darlene [name] ?MRN: [id number] ?Date of Birth: [birth year] ?Referring Provider (PT): Thane Edu MD ? ? ?Encounter Date: 09/14/2021 ? ? PT End of Session - 09/14/21 1010   ? ? Visit Number 7   ? Number of Visits 8   ? Date for PT Re-Evaluation 09/21/21   ? Authorization Type FOTO.   ? PT Start Time 0815   ? PT Stop Time 0906   ? PT Time Calculation (min) 51 min   ? Activity Tolerance Patient tolerated treatment well   ? Behavior During Therapy [facility] for tasks assessed/performed   ? ?  ?  ? ?  ? ? ?Past Medical History:  ?Diagnosis Date  ? Fatty liver   ? Fatty liver   ? GERD (gastroesophageal reflux disease)   ? Hiatal hernia   ? Hypertension   ? Hypokalemia   ? OA (osteoarthritis)   ? Plantar fasciitis   ? Vitamin D deficiency   ? ? ?Past Surgical History:  ?Procedure Laterality Date  ? CESAREAN SECTION  1993  ? FOOT SURGERY Right 1997  ? INSERTION OF MESH N/A 01/28/2016  ? Procedure: INSERTION OF MESH;  Surgeon: Axel Filler, MD;  Location: [facility];  Service: General;  Laterality: N/A;  ? INSERTION OF MESH  09/06/2018  ? Procedure: INSERTION OF MESH;  Surgeon: Axel Filler, MD;  Location: [facility];  Service: General;;  ? Right Knee Surgery    ? tubal ligation    ? ? ?There were no vitals filed for this visit. ? ? Subjective Assessment - 09/14/21 0956   ? ? Subjective Been doing real good.  Patient did have a small bruise in her anterior elbow region due to an IV related to stomach issues.   ? Pertinent History OA, HTN, Right foot and hernia surgery.   ? Patient Stated Goals Use left hand without pain.   ? Currently in Pain? Yes   ? Pain Score 1    ? Pain Location Elbow   ? Pain Orientation Left   ? Pain Descriptors / Indicators Dull   ? Pain Type Chronic pain   ? Pain Onset More than a month ago   ? ?  ?  ? ?   ? ? ? ? ? ? ? ? ? ? ? ? ? ? ? ? ? ? ? ? [facility] Adult PT Treatment/Exercise - 09/14/21 0001   ? ?  ? Exercises  ? Exercises Shoulder;Elbow   ?  ? Elbow Exercises  ? Other elbow exercises 2# left wrist flexion to fatigue x 2.   ?  ? Shoulder Exercises: ROM/Strengthening  ? UBE (Upper Arm Bike) 120 RPM's x 6 minutes.   ?  ? Modalities  ? Modalities Electrical Stimulation;Ultrasound   ?  ? Electrical Stimulation  ? Electrical Stimulation Location Left medial elbow.   ? Electrical Stimulation Action Pre-mod.   ? Electrical Stimulation Parameters 80-150 Hz. x 20 minutes.   ? Electrical Stimulation Goals Pain   ?  ? Ultrasound  ? Ultrasound Location Left medial elbow.   ? Ultrasound Parameters Small soundhead combo e'stim/US at 1.50 W/CM2 x 8 minutes at 3.3 mHz, 50%.   ?  ? Manual Therapy  ? Manual Therapy Soft tissue mobilization   ? Soft tissue mobilization STW/M x 7 minutes to patient's left proximal wrist  flexor musculature.   ? ?  ?  ? ?  ? ? ? ? ? ? ? ? ? ? PT Education - 09/14/21 1005   ? ? Education Details Provided patient with red theraband to perform resisted left wrist flexion and yellow theraputty gripping.   ? Person(s) Educated Patient   ? Methods Explanation;Demonstration   ? ?  ?  ? ?  ? ? ? ? ? ? PT Long Term Goals - 08/03/21 0939   ? ?  ? PT LONG TERM GOAL #1  ? Title Independent with a HEP.   ? Time 6   ? Period Weeks   ? Status New   ?  ? PT LONG TERM GOAL #2  ? Title Perform ADL's with left elbow pain not > 2-3/10.   ? Time 6   ? Period Weeks   ? Status New   ? ?  ?  ? ?  ? ? ? ? ? ? ? ? Plan - 09/14/21 1009   ? ? Clinical Impression Statement Excellent progress. No pain reproduction with ther ex.  No pain after treatment.  Discharge next visit.   ? Personal Factors and Comorbidities Comorbidity 1;Other   ? Examination-Activity Limitations Other   ? Examination-Participation Restrictions Other;Pincus Badder Work   ? Stability/Clinical Decision Making Stable/Uncomplicated   ? Rehab Potential Excellent   ? PT  Frequency 1x / week   ? PT Treatment/Interventions ADLs/Self Care Home Management;Cryotherapy;Electrical Stimulation;Ultrasound;Moist Heat;Iontophoresis 4mg /ml Dexamethasone;Therapeutic activities;Therapeutic exercise;Manual techniques;Patient/family education;Passive range of motion;Dry needling   ? PT Next Visit Plan Pulsed combo e'stim/US; STW/M, IASTM, theraband resisted pain-free left elbow flexion for HEP.   ? ?  ?  ? ?  ? ? ?Patient will benefit from skilled therapeutic intervention in order to improve the following deficits and impairments:    ? ?Visit Diagnosis: ?Pain in left elbow ? ? ? ? ?Problem List ?Patient Active Problem List  ? Diagnosis Date Noted  ? S/P Nissen fundoplication (without gastrostomy tube) procedure 09/06/2018  ? Spondylosis of lumbar region without myelopathy or radiculopathy 03/13/2018  ? Plantar fasciitis 03/13/2018  ? Generalized OA 03/13/2018  ? Tic disorder 03/23/2017  ? Paroxysmal supraventricular tachycardia (HCC) 08/02/2016  ? GAD (generalized anxiety disorder) 08/02/2016  ? Fatty liver 03/16/2016  ? S/P Nissen fundoplication (with gastrostomy tube placement) (HCC) 01/28/2016  ? Allergic rhinitis 01/03/2016  ? Edema 01/03/2016  ? Hypokalemia 01/03/2016  ? Hiatal hernia 01/03/2016  ? Esophageal reflux 01/03/2016  ? Body mass index 34.0-34.9, adult 01/03/2016  ? ? ?Zeriyah Wain, 01/05/2016, PT ?09/14/2021, 10:12 AM ? ?[place] ?[facility] ?[address] ?[place], [place], [place] ?Phone: [phone]   Fax:  [phone] ? ?Name: Darlene [name] ?MRN: Sandi Carne ?Date of Birth: 07-17-1970 ? ? ? ?

## 2021-09-17 ENCOUNTER — Other Ambulatory Visit: Payer: Self-pay | Admitting: Family Medicine

## 2021-09-17 DIAGNOSIS — K219 Gastro-esophageal reflux disease without esophagitis: Secondary | ICD-10-CM

## 2021-09-19 MED ORDER — PANTOPRAZOLE SODIUM 40 MG PO TBEC: 40.0000 mg | DELAYED_RELEASE_TABLET | Freq: Two times a day (BID) | ORAL | 0 refills | Status: DC

## 2021-09-21 ENCOUNTER — Ambulatory Visit: Payer: BC Managed Care – PPO | Admitting: Physical Therapy

## 2021-09-21 DIAGNOSIS — M25522 Pain in left elbow: Secondary | ICD-10-CM

## 2021-09-21 NOTE — Therapy (Signed)
[place] ?[facility] ?[address] ?[place], [place], [address] ?Phone: [phone]   Fax:  [phone] ? ?Physical Therapy Treatment ? ?Patient Details  ?Name: Darlene [name] ?MRN: [id number] ?Date of Birth: Oct 04, 1970 ?Referring Provider (PT): Larena Glassman MD ? ? ?Encounter Date: 09/21/2021 ? ? PT End of Session - 09/21/21 1210   ? ? Visit Number 8   ? Number of Visits 8   ? Date for PT Re-Evaluation 09/21/21   ? PT Start Time 0820   ? PT Stop Time 0768   ? PT Time Calculation (min) 52 min   ? Activity Tolerance Patient tolerated treatment well   ? Behavior During Therapy [facility] for tasks assessed/performed   ? ?  ?  ? ?  ? ? ?Past Medical History:  ?Diagnosis Date  ? Fatty liver   ? Fatty liver   ? GERD (gastroesophageal reflux disease)   ? Hiatal hernia   ? Hypertension   ? Hypokalemia   ? OA (osteoarthritis)   ? Plantar fasciitis   ? Vitamin D deficiency   ? ? ?Past Surgical History:  ?Procedure Laterality Date  ? [place]  ? FOOT SURGERY Right 1997  ? INSERTION OF MESH N/A 01/28/2016  ? Procedure: INSERTION OF MESH;  Surgeon: Ralene Ok, MD;  [facility];  Service: General;  Laterality: N/A;  ? INSERTION OF MESH  09/06/2018  ? Procedure: INSERTION OF MESH;  Surgeon: Ralene Ok, MD;  [facility]: [facility];  Service: General;;  ? Right Knee Surgery    ? tubal ligation    ? ? ?There were no vitals filed for this visit. ? ? Subjective Assessment - 09/21/21 1205   ? ? Subjective Doing great.   ? Pertinent History OA, HTN, Right foot and hernia surgery.   ? Patient Stated Goals Use left hand without pain.   ? Currently in Pain? Yes   ? Pain Score 1    ? Pain Location Elbow   ? Pain Orientation Left   ? Pain Descriptors / Indicators Dull   ? Pain Type Chronic pain   ? Pain Onset More than a month ago   ? ?  ?  ? ?  ? ? ? ? ? ? ? ? ? ? ? ? ? ? ? ? ? ? ? ? [facility] Adult PT Treatment/Exercise - 09/21/21 0001   ? ?  ? Modalities  ? Modalities Electrical Stimulation;Moist  Heat;Ultrasound   ?  ? Moist Heat Therapy  ? Number Minutes Moist Heat 15 Minutes   ? Moist Heat Location --   Left elbow.  ?  ? Electrical Stimulation  ? Electrical Stimulation Location Left medial elbow.   ? Electrical Stimulation Action Pre-mod.   ? Electrical Stimulation Parameters 80-150 Hz x 15 minutes.   ?  ? Ultrasound  ? Ultrasound Location Left medila elbow.   ? Ultrasound Parameters Small soundhead combo e'stim/US at 1.50 W/CM2 at 50% x 12 minutes.   ?  ? Manual Therapy  ? Manual Therapy Soft tissue mobilization   ? Soft tissue mobilization STW/M x 11 minutes to patient's left medial elbow muscualture.   ? ?  ?  ? ?  ? ? ? ? ? ? ? ? ? ? PT Education - 09/21/21 1206   ? ? Education Details Provided patient with red theraputty for HEP.   ? ?  ?  ? ?  ? ? ? ? ? ? PT Long Term Goals -  09/21/21 1205   ? ?  ? PT LONG TERM GOAL #1  ? Title Independent with a HEP.   ? Time 6   ? Period Weeks   ? Status Achieved   ?  ? PT LONG TERM GOAL #2  ? Title Perform ADL's with left elbow pain not > 2-3/10.   ? Time 6   ? Period Weeks   ? Status Achieved   ? ?  ?  ? ?  ? ? ? ? ? ? ? ? ? ?Patient will benefit from skilled therapeutic intervention in order to improve the following deficits and impairments:    ? ?Visit Diagnosis: ?Pain in left elbow ? ? ? ? ?Problem List ?Patient Active Problem List  ? Diagnosis Date Noted  ? S/P Nissen fundoplication (without gastrostomy tube) procedure 09/06/2018  ? Spondylosis of lumbar region without myelopathy or radiculopathy 03/13/2018  ? Plantar fasciitis 03/13/2018  ? Generalized OA 03/13/2018  ? Tic disorder 03/23/2017  ? Paroxysmal supraventricular tachycardia ([place]) 08/02/2016  ? GAD (generalized anxiety disorder) 08/02/2016  ? Fatty liver 03/16/2016  ? S/P Nissen fundoplication (with gastrostomy tube placement) ([place]) 01/28/2016  ? Allergic rhinitis 01/03/2016  ? Edema 01/03/2016  ? Hypokalemia 01/03/2016  ? Hiatal hernia 01/03/2016  ? Esophageal reflux 01/03/2016  ? Body mass index  34.0-34.9, adult 01/03/2016  ? ?PHYSICAL THERAPY DISCHARGE SUMMARY ? ?Visits from Start of Care: 8. ? ?Current functional level related to goals / functional outcomes: ?See above. ?  ?Remaining deficits: ?All goals met. ?  ?Education / Equipment: ?HEP.  ? ?Patient agrees to discharge. Patient goals were met. Patient is being discharged due to meeting the stated rehab goals. ? ?Darwin Guastella, [place], PT ?09/21/2021, 12:16 PM ? ?[place] ?[facility] ?[address] ?[place], [place], [address] ?Phone: [phone]   Fax:  [phone] ? ?Name: Darlene [name] ?MRN: [id number] ?Date of Birth: 1970-11-04 ? ? ? ?

## 2021-10-05 ENCOUNTER — Other Ambulatory Visit: Payer: Self-pay | Admitting: Family Medicine

## 2021-10-05 DIAGNOSIS — F411 Generalized anxiety disorder: Secondary | ICD-10-CM

## 2021-10-11 ENCOUNTER — Encounter: Payer: Self-pay | Admitting: Neurology

## 2021-10-11 ENCOUNTER — Ambulatory Visit: Payer: BC Managed Care – PPO | Admitting: Neurology

## 2021-10-11 VITALS — BP 112/75 | HR 90 | Ht 62.0 in | Wt 252.4 lb

## 2021-10-11 DIAGNOSIS — R0681 Apnea, not elsewhere classified: Secondary | ICD-10-CM | POA: Diagnosis not present

## 2021-10-11 DIAGNOSIS — R0683 Snoring: Secondary | ICD-10-CM

## 2021-10-11 DIAGNOSIS — G2581 Restless legs syndrome: Secondary | ICD-10-CM

## 2021-10-11 DIAGNOSIS — Z6841 Body Mass Index (BMI) 40.0 and over, adult: Secondary | ICD-10-CM

## 2021-10-11 DIAGNOSIS — R635 Abnormal weight gain: Secondary | ICD-10-CM | POA: Diagnosis not present

## 2021-10-11 DIAGNOSIS — G253 Myoclonus: Secondary | ICD-10-CM

## 2021-10-11 DIAGNOSIS — Z820 Family history of epilepsy and other diseases of the nervous system: Secondary | ICD-10-CM

## 2021-10-11 DIAGNOSIS — G4719 Other hypersomnia: Secondary | ICD-10-CM

## 2021-10-11 DIAGNOSIS — R519 Headache, unspecified: Secondary | ICD-10-CM

## 2021-10-11 NOTE — Patient Instructions (Signed)

## 2021-10-11 NOTE — Progress Notes (Signed)
Subjective:    Patient ID: Darlene [name] is a 51 y.o. female.  HPI    Star Age, MD, PhD [facility] [address], [address].O. [place], [place] [address]  Dear Dr. Lajuana Ripple,   I saw your patient, Darlene [name], upon your kind request, in my Sleep clinic today for initial consultation of her sleep disorder, in particular, concern for underlying obstructive sleep apnea.  The patient is accompanied by her husband today.  As you know, Ms. Darlene [name] is a 51 year old right-handed woman with an underlying medical history of reflux disease, hypokalemia, hypertension, osteoarthritis, vitamin D deficiency, planter fasciitis, elbow pain, and severe obesity with a BMI of over 45, who reports snoring and excessive daytime somnolence, as well as witnessed apneas per husband's report.  She makes gasping sounds and snoring sounds while asleep and also jerks in her sleep.  She endorses restless leg symptoms and takes Mirapex as needed for this.  I reviewed the office note from 07/01/2021.  Her Epworth sleepiness score is 12 out of 24, fatigue severity score is 37 out of 63.  She lives with her husband.  Their children are grown, they have 3 children.  They have no pets in the household.  She works 2 jobs and reports not waking up rested and having to catch up on sleep when she has days off.  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  She goes to bed generally between 11:30 PM and midnight and rise time is 6:45 AM.  She has no night to night nocturia but has woken up with a headache.  She has a family history of sleep apnea, mom has sleep apnea but currently not on a CPAP machine.  Patient drinks caffeine in the form of soda, about 4 cans/day.  She does not drink any alcohol and does not smoke.  She has difficulty sleeping through the night.  Her sleep related symptoms  have become worse over the past couple years.  Her Past Medical History Is Significant For: Past Medical History:  Diagnosis Date   Fatty liver    Fatty liver    GERD (gastroesophageal reflux disease)    Hiatal hernia    Hypertension    Hypokalemia    OA (osteoarthritis)    Plantar fasciitis    Vitamin D deficiency     Her Past Surgical History Is Significant For: Past Surgical History:  Procedure Laterality Date   CESAREAN SECTION  1993   FOOT SURGERY Right 1997   INSERTION OF MESH N/A 01/28/2016   Procedure: INSERTION OF MESH;  Surgeon: Ralene Ok, MD;  Location: [facility];  Service: General;  Laterality: N/A;   INSERTION OF MESH  09/06/2018   Procedure: INSERTION OF MESH;  Surgeon: Ralene Ok, MD;  Location: [facility];  Service: General;;   Right Knee Surgery     tubal ligation      Her Family History Is Significant For: Family History  Problem Relation Age of Onset   High blood pressure Mother    Diabetes Mother    Kidney cancer Mother    Sleep apnea Mother    Hyperlipidemia Sister    Heart disease Sister    Heart disease Brother    Hyperlipidemia Brother    Colon cancer Neg Hx    Rectal cancer Neg Hx  Stomach cancer Neg Hx     Her Social History Is Significant For: Social History   Socioeconomic History   Marital status: Married    Spouse name: Not on file   Number of children: 3   Years of education: Not on file   Highest education level: Not on file  Occupational History   Occupation: TEXTILE  Tobacco Use   Smoking status: Never   Smokeless tobacco: Never  Vaping Use   Vaping Use: Never used  Substance and Sexual Activity   Alcohol use: No    Alcohol/week: 0.0 standard drinks   Drug use: No   Sexual activity: Not on file  Other Topics Concern   Not on file  Social History Narrative   Not on file   Social Determinants of Health   Financial Resource Strain: Not on file  Food Insecurity: Not on file  Transportation Needs: Not on file   Physical Activity: Not on file  Stress: Not on file  Social Connections: Not on file    Her Allergies Are:  No Known Allergies:   Her Current Medications Are:  Outpatient Encounter Medications as of 10/11/2021  Medication Sig   albuterol (VENTOLIN HFA) 108 (90 Base) MCG/ACT inhaler INHALE 2 PUFFS BY MOUTH EVERY 4 HOURS AS NEEDED FOR WHEEZE OR FOR SHORTNESS OF BREATH (Patient taking differently: as needed.)   citalopram (CELEXA) 40 MG tablet TAKE 1 TABLET BY MOUTH EVERY DAY   cyclobenzaprine (FLEXERIL) 10 MG tablet Take 1 tablet (10 mg total) by mouth 3 (three) times daily as needed for muscle spasms. (Patient taking differently: Take 10 mg by mouth as needed for muscle spasms.)   gabapentin (NEURONTIN) 100 MG capsule Take 1 capsule (100 mg total) by mouth 2 (two) times daily.   hydrochlorothiazide (HYDRODIURIL) 25 MG tablet TAKE 1 TABLET (25 MG TOTAL) BY MOUTH DAILY WITH BREAKFAST.   ibuprofen (ADVIL) 800 MG tablet TAKE 1 TABLET BY MOUTH THREE TIMES A DAY (Patient taking differently: as needed.)   loratadine (CLARITIN) 10 MG tablet TAKE 1 TABLET BY MOUTH EVERY DAY   metoprolol succinate (TOPROL-XL) 25 MG 24 hr tablet TAKE 1 TABLET (25 MG TOTAL) BY MOUTH DAILY.   pantoprazole (PROTONIX) 40 MG tablet Take 1 tablet (40 mg total) by mouth 2 (two) times daily.   Plecanatide (TRULANCE) 3 MG TABS Take 3 mg by mouth daily. For constipation.  Bringing in coupon   Potassium Chloride ER 20 MEQ TBCR Take 1 tablet by mouth in the morning and at bedtime. For low potassium   pramipexole (MIRAPEX) 0.25 MG tablet Take 0.75 mg by mouth 2 (two) times daily. Supper and bedtime   No facility-administered encounter medications on file as of 10/11/2021.  :   Review of Systems:  Out of a complete 14 point review of systems, all are reviewed and negative with the exception of these symptoms as listed below:  Review of Systems  Neurological:        Pt is here for sleep Consult  pt states she snores  ,fatigue,headaches,hypertension . Pt denies sleep study and CPAP machine    ESS:12 FSS:37   Objective:  Neurological Exam  Physical Exam Physical Examination:   Vitals:   10/11/21 0923  BP: 112/75  Pulse: 90    General Examination: The patient is a very pleasant 51 y.o. female in no acute distress. She appears well-developed and well-nourished and well groomed.   HEENT: Normocephalic, atraumatic, pupils are equal, round and reactive to light, extraocular tracking  is good without limitation to gaze excursion or nystagmus noted. Hearing is grossly intact. Face is symmetric with normal facial animation. Speech is clear with no dysarthria noted. There is no hypophonia. There is no lip, neck/head, jaw or voice tremor. Neck is supple with full range of passive and active motion. There are no carotid bruits on auscultation. Oropharynx exam reveals: Mouth dryness, adequate dental hygiene, moderate airway crowding secondary to small airway entry, smaller tonsils noted, small uvula noted, mild overbite noted.  Neck circumference of 15 inches.  Tongue protrudes centrally and palate elevates symmetrically.   Chest: Clear to auscultation without wheezing, rhonchi or crackles noted.  Heart: S1+S2+0, regular and normal without murmurs, rubs or gallops noted.   Abdomen: Soft, non-tender and non-distended.  Extremities: There is no pitting edema in the distal lower extremities bilaterally.   Skin: Warm and dry without trophic changes noted.   Musculoskeletal: exam reveals no obvious joint deformities.   Neurologically:  Mental status: The patient is awake, alert and oriented in all 4 spheres. Her immediate and remote memory, attention, language skills and fund of knowledge are appropriate. There is no evidence of aphasia, agnosia, apraxia or anomia. Speech is clear with normal prosody and enunciation. Thought process is linear. Mood is normal and affect is normal.  Cranial nerves II - XII are as  described above under HEENT exam.  Motor exam: Normal bulk, strength and tone is noted. There is no obvious tremor. Fine motor skills and coordination: grossly intact.  Cerebellar testing: No dysmetria or intention tremor. There is no truncal or gait ataxia.  Sensory exam: intact to light touch in the upper and lower extremities.  Gait, station and balance: She stands easily. No veering to one side is noted. No leaning to one side is noted. Posture is age-appropriate and stance is narrow based. Gait shows normal stride length and normal pace. No problems turning are noted.   Assessment and Plan:  In summary, Breaunna Elouise Gallegos is a very pleasant 51 y.o.-year old female with a history and physical exam concerning for obstructive sleep apnea (OSA). I had a long chat with the patient and her husband about my findings and the diagnosis of OSA, its prognosis and treatment options. We talked about medical treatments, surgical interventions and non-pharmacological approaches. I explained in particular the risks and ramifications of untreated moderate to severe OSA, especially with respect to developing cardiovascular disease down the Road, including congestive heart failure, difficult to treat hypertension, cardiac arrhythmias, or stroke. Even type 2 diabetes has, in part, been linked to untreated OSA. Symptoms of untreated OSA include daytime sleepiness, memory problems, mood irritability and mood disorder such as depression and anxiety, lack of energy, as well as recurrent headaches, especially morning headaches. We talked about trying to maintain a healthy lifestyle in general, as well as the importance of weight control. We also talked about the importance of good sleep hygiene; in particular, we talked about the importance of making enough time for sleep as well. I recommended the following at this time: sleep study.  I outlined the differences between a laboratory attended sleep study versus home sleep  testing. I explained the sleep test procedure to the patient and also outlined possible surgical and non-surgical treatment options of OSA, including the use of a custom-made dental device (which would require a referral to a specialist dentist or oral surgeon), upper airway surgical options, such as traditional UPPP or a novel less invasive surgical option in the form of Inspire  hypoglossal nerve stimulation (which would involve a referral to an ENT surgeon). I also explained the CPAP treatment option to the patient, who indicated that she would be willing to try PAP therapy, if the need arises. I explained the importance of being compliant with PAP treatment, not only for insurance purposes but primarily to improve Her symptoms, and for the patient's long term health benefit, including to reduce Her cardiovascular risks. I answered all their questions today and the patient and her husband were in agreement. I plan to see her back after the sleep study is completed and encouraged her to call with any interim questions, concerns, problems or updates.   Thank you very much for allowing me to participate in the care of this nice patient. If I can be of any further assistance to you please do not hesitate to call me at [phone].  Sincerely,   Star Age, MD, PhD

## 2021-10-13 ENCOUNTER — Telehealth: Payer: BC Managed Care – PPO | Admitting: Physician Assistant

## 2021-10-13 ENCOUNTER — Telehealth: Payer: BC Managed Care – PPO | Admitting: Family Medicine

## 2021-10-13 DIAGNOSIS — J069 Acute upper respiratory infection, unspecified: Secondary | ICD-10-CM | POA: Diagnosis not present

## 2021-10-13 MED ORDER — BENZONATATE 100 MG PO CAPS: 100.0000 mg | ORAL_CAPSULE | Freq: Two times a day (BID) | ORAL | 0 refills | Status: DC | PRN

## 2021-10-13 MED ORDER — PROMETHAZINE-DM 6.25-15 MG/5ML PO SYRP: 5.0000 mL | ORAL_SOLUTION | Freq: Four times a day (QID) | ORAL | 0 refills | Status: DC | PRN

## 2021-10-13 NOTE — Progress Notes (Signed)
Virtual Visit Consent   Darlene [name], you are scheduled for a virtual visit with a [facility] provider today. Just as with appointments in the office, your consent must be obtained to participate. Your consent will be active for this visit and any virtual visit you may have with one of our providers in the next 365 days. If you have a MyChart account, a copy of this consent can be sent to you electronically.  As this is a virtual visit, video technology does not allow for your provider to perform a traditional examination. This may limit your provider's ability to fully assess your condition. If your provider identifies any concerns that need to be evaluated in person or the need to arrange testing (such as labs, EKG, etc.), we will make arrangements to do so. Although advances in technology are sophisticated, we cannot ensure that it will always work on either your end or our end. If the connection with a video visit is poor, the visit may have to be switched to a telephone visit. With either a video or telephone visit, we are not always able to ensure that we have a secure connection.  By engaging in this virtual visit, you consent to the provision of healthcare and authorize for your insurance to be billed (if applicable) for the services provided during this visit. Depending on your insurance coverage, you may receive a charge related to this service.  I need to obtain your verbal consent now. Are you willing to proceed with your visit today? Darlene [name] has provided verbal consent on 10/13/2021 for a virtual visit (video or telephone). Freddy Finner, NP  Date: 10/13/2021 8:31 AM  Virtual Visit via Video Note   I, Freddy Finner, connected with  Darlene [name]  ([id number], Jun 02, 1970) on 10/13/21 at  9:15 AM EDT by a video-enabled telemedicine application and verified that I am speaking with the correct person using two identifiers.  Location: Patient: Virtual Visit Location Patient:  Home Provider: Virtual Visit Location Provider: Home Office   I discussed the limitations of evaluation and management by telemedicine and the availability of in person appointments. The patient expressed understanding and agreed to proceed.    History of Present Illness: Darlene [name] is a 51 y.o. who identifies as a female who was assigned female at birth, and is being seen today for cough and sinus pressure. Started Tuesday 10/11/2021. Takes Claritin daily- no help with this at currently.   HPI: Sinus Problem This is a new problem. The current episode started in the past 7 days. The problem has been gradually worsening since onset. There has been no fever. Associated symptoms include congestion, coughing, a hoarse voice, sinus pressure and sneezing. Past treatments include saline sprays and oral decongestants. The treatment provided mild relief.   Problems:  Patient Active Problem List   Diagnosis Date Noted   S/P Nissen fundoplication (without gastrostomy tube) procedure 09/06/2018   Spondylosis of lumbar region without myelopathy or radiculopathy 03/13/2018   Plantar fasciitis 03/13/2018   Generalized OA 03/13/2018   Tic disorder 03/23/2017   Paroxysmal supraventricular tachycardia (HCC) 08/02/2016   GAD (generalized anxiety disorder) 08/02/2016   Fatty liver 03/16/2016   S/P Nissen fundoplication (with gastrostomy tube placement) (HCC) 01/28/2016   Allergic rhinitis 01/03/2016   Edema 01/03/2016   Hypokalemia 01/03/2016   Hiatal hernia 01/03/2016   Esophageal reflux 01/03/2016   Body mass index 34.0-34.9, adult 01/03/2016    Allergies: No Known Allergies Medications:  Current Outpatient Medications:    albuterol (VENTOLIN HFA) 108 (90 Base) MCG/ACT inhaler, INHALE 2 PUFFS BY MOUTH EVERY 4 HOURS AS NEEDED FOR WHEEZE OR FOR SHORTNESS OF BREATH (Patient taking differently: as needed.), Disp: 6.7 each, Rfl: 0   citalopram (CELEXA) 40 MG tablet, TAKE 1 TABLET BY MOUTH EVERY DAY,  Disp: 90 tablet, Rfl: 3   cyclobenzaprine (FLEXERIL) 10 MG tablet, Take 1 tablet (10 mg total) by mouth 3 (three) times daily as needed for muscle spasms. (Patient taking differently: Take 10 mg by mouth as needed for muscle spasms.), Disp: 30 tablet, Rfl: 2   gabapentin (NEURONTIN) 100 MG capsule, Take 1 capsule (100 mg total) by mouth 2 (two) times daily., Disp: 180 capsule, Rfl: 3   hydrochlorothiazide (HYDRODIURIL) 25 MG tablet, TAKE 1 TABLET (25 MG TOTAL) BY MOUTH DAILY WITH BREAKFAST., Disp: 90 tablet, Rfl: 1   ibuprofen (ADVIL) 800 MG tablet, TAKE 1 TABLET BY MOUTH THREE TIMES A DAY (Patient taking differently: as needed.), Disp: 270 tablet, Rfl: 0   loratadine (CLARITIN) 10 MG tablet, TAKE 1 TABLET BY MOUTH EVERY DAY, Disp: 90 tablet, Rfl: 1   metoprolol succinate (TOPROL-XL) 25 MG 24 hr tablet, TAKE 1 TABLET (25 MG TOTAL) BY MOUTH DAILY., Disp: 90 tablet, Rfl: 1   pantoprazole (PROTONIX) 40 MG tablet, Take 1 tablet (40 mg total) by mouth 2 (two) times daily., Disp: 180 tablet, Rfl: 0   Plecanatide (TRULANCE) 3 MG TABS, Take 3 mg by mouth daily. For constipation.  Bringing in coupon, Disp: 90 tablet, Rfl: 3   Potassium Chloride ER 20 MEQ TBCR, Take 1 tablet by mouth in the morning and at bedtime. For low potassium, Disp: 180 tablet, Rfl: 3   pramipexole (MIRAPEX) 0.25 MG tablet, Take 0.75 mg by mouth 2 (two) times daily. Supper and bedtime, Disp: , Rfl:   Observations/Objective: Patient is well-developed, well-nourished in no acute distress.  Resting comfortably  at home.  Head is normocephalic, atraumatic.  No labored breathing.  Speech is clear and coherent with logical content.  Patient is alert and oriented at baseline.  Nasal congestion tone Cough   Assessment and Plan: 1. Viral URI with cough  - promethazine-dextromethorphan (PROMETHAZINE-DM) 6.25-15 MG/5ML syrup; Take 5 mLs by mouth 4 (four) times daily as needed for cough.  Dispense: 118 mL; Refill: 0 - benzonatate  (TESSALON) 100 MG capsule; Take 1 capsule (100 mg total) by mouth 2 (two) times daily as needed for cough.  Dispense: 20 capsule; Refill: 0  S&S are consistent with viral URI with cough, does not warrant anbx at this time given duration of 2 days. Decline nasal sprays Will add cough meds (not to use with OTC cough meds) to OTC of mucinex    Reviewed side effects, risks and benefits of medication.    Patient acknowledged agreement and understanding of the plan.    Follow Up Instructions: I discussed the assessment and treatment plan with the patient. The patient was provided an opportunity to ask questions and all were answered. The patient agreed with the plan and demonstrated an understanding of the instructions.  A copy of instructions were sent to the patient via MyChart unless otherwise noted below.    The patient was advised to call back or seek an in-person evaluation if the symptoms worsen or if the condition fails to improve as anticipated.  Time:  I spent 15 minutes with the patient via telehealth technology discussing the above problems/concerns.    Freddy Finner, NP

## 2021-10-13 NOTE — Progress Notes (Signed)
Patient rescheduled for 9:15. No charge.

## 2021-10-13 NOTE — Patient Instructions (Signed)

## 2021-10-21 ENCOUNTER — Telehealth: Payer: Self-pay | Admitting: Neurology

## 2021-10-21 NOTE — Telephone Encounter (Signed)
LVM for pt to call back to schedule   [employer] no auth req spoke to [place] ref # [id number]

## 2021-10-24 NOTE — Telephone Encounter (Signed)
[employer]- [employer] no auth req spoke to [place] ref # [id number], patient returned my call she is scheduled at [facility] for 10/25/21 at 8 pm.

## 2021-10-25 ENCOUNTER — Ambulatory Visit (INDEPENDENT_AMBULATORY_CARE_PROVIDER_SITE_OTHER): Payer: BC Managed Care – PPO | Admitting: Neurology

## 2021-10-25 DIAGNOSIS — G2581 Restless legs syndrome: Secondary | ICD-10-CM

## 2021-10-25 DIAGNOSIS — R0683 Snoring: Secondary | ICD-10-CM | POA: Diagnosis not present

## 2021-10-25 DIAGNOSIS — R0681 Apnea, not elsewhere classified: Secondary | ICD-10-CM

## 2021-10-25 DIAGNOSIS — R635 Abnormal weight gain: Secondary | ICD-10-CM

## 2021-10-25 DIAGNOSIS — G253 Myoclonus: Secondary | ICD-10-CM

## 2021-10-25 DIAGNOSIS — G4719 Other hypersomnia: Secondary | ICD-10-CM

## 2021-10-25 DIAGNOSIS — R519 Headache, unspecified: Secondary | ICD-10-CM

## 2021-10-25 DIAGNOSIS — Z820 Family history of epilepsy and other diseases of the nervous system: Secondary | ICD-10-CM

## 2021-10-25 DIAGNOSIS — G4733 Obstructive sleep apnea (adult) (pediatric): Secondary | ICD-10-CM

## 2021-11-01 NOTE — Addendum Note (Signed)
Addended by: Huston Foley on: 11/01/2021 06:24 PM   Modules accepted: Orders

## 2021-11-01 NOTE — Procedures (Signed)
PATIENT'S NAME:  Darlene [name], Darlene [name] DOB:      05-Mar-1971      MR#:    [id number]     DATE OF RECORDING: 10/25/2021 REFERRING M.D.:  Delynn Flavin, DO Study Performed:   Baseline Polysomnogram HISTORY:  51 year old woman with a history of reflux disease, hypokalemia, hypertension, osteoarthritis, vitamin D deficiency, planter fasciitis, elbow pain, and severe obesity with a BMI of over 45, who reports snoring and excessive daytime somnolence, as well as witnessed apneas. She takes Mirapex prn for RLS. The patient endorsed the Epworth Sleepiness Scale at 12 points. The patient's weight 252 pounds with a height of 62 (inches), resulting in a BMI of 46.2 kg/m2. The patient's neck circumference measured 15 inches.  CURRENT MEDICATIONS: Ventolin HFA, Celexa, Flexeril, Neurontin, Hydrodiuril, Advil, Toprol-XL, Claritin, Protonix, Trulance, Potassium Choloride, Mirapex   PROCEDURE:  This is a multichannel digital polysomnogram utilizing the Somnostar 11.2 system.  Electrodes and sensors were applied and monitored per AASM Specifications.   EEG, EOG, Chin and Limb EMG, were sampled at 200 Hz.  ECG, Snore and Nasal Pressure, Thermal Airflow, Respiratory Effort, CPAP Flow and Pressure, Oximetry was sampled at 50 Hz. Digital video and audio were recorded.      BASELINE STUDY  Lights Out was at 20:38 and Lights On at 04:57.  Total recording time (TRT) was 500 minutes, with a total sleep time (TST) of 372.5 minutes.   The patient's sleep latency was 118.5 minutes which is delayed.  REM latency was 125 minutes, which is mildly delayed. The sleep efficiency was 74.5%.     SLEEP ARCHITECTURE: WASO (Wake after sleep onset) was 15.5 minutes.  There were 10.5 minutes in Stage N1, 201 minutes Stage N2, 89.5 minutes Stage N3 and 71.5 minutes in Stage REM.  The percentage of Stage N1 was 2.8%, Stage N2 was 54.%, which is normal, Stage N3 was 24.%, which is mildly increased, and Stage R (REM sleep) was 19.2%, which is  low-normal. The arousals were noted as: 46 were spontaneous, 0 were associated with PLMs, 3 were associated with respiratory events.  RESPIRATORY ANALYSIS:  There were a total of 41 respiratory events:  1 obstructive apneas, 0 central apneas and 0 mixed apneas with a total of 1 apneas and an apnea index (AI) of .2 /hour. There were 40 hypopneas with a hypopnea index of 6.4 /hour. The patient also had 0 respiratory event related arousals (RERAs).      The total APNEA/HYPOPNEA INDEX (AHI) was 6.6/hour and the total RESPIRATORY DISTURBANCE INDEX was  6.6 /hour.  28 events occurred in REM sleep and 26 events in NREM. The REM AHI was  23.5 /hour, versus a non-REM AHI of 2.6. The patient spent 219 minutes of total sleep time in the supine position and 154 minutes in non-supine.. The supine AHI was 11.0 versus a non-supine AHI of 0.4.  OXYGEN SATURATION & C02:  The Wake baseline 02 saturation was 96%, with the lowest being 86%. Time spent below 89% saturation equaled 1 minutes.  PERIODIC LIMB MOVEMENTS: The patient had a total of 7 Periodic Limb Movements.  The Periodic Limb Movement (PLM) index was 1.1 and the PLM Arousal index was 0/hour.  Audio and video analysis did not show any abnormal or unusual movements, behaviors, phonations or vocalizations. The patient took no bathroom breaks. Mild snoring was noted. The EKG was in keeping with normal sinus rhythm (NSR).  Post-study, the patient indicated that sleep was the same as usual.   IMPRESSION:  Mild Obstructive Sleep Apnea (OSA)  RECOMMENDATIONS:  This study demonstrates overall mild obstructive sleep apnea, moderate during REM sleep with a total AHI of 6.6/hour, REM AHI of 23.5/hour, supine AHI of 11/hour and O2 nadir of 86%. Given the patient's medical history and sleep related complaints, treatment with positive airway pressure can be considered as first line treatment; this can be achieved in the form of autoPAP. Alternatively, a full-night CPAP  titration study would allow optimization of therapy if needed. Other treatment options may include avoidance of supine sleep position along with weight loss, or the use of an oral appliance in certain patients (through dentistry or an orthodontist).  Please note that untreated obstructive sleep apnea may carry additional perioperative morbidity. Patients with significant obstructive sleep apnea should receive perioperative PAP therapy and the surgeons and particularly the anesthesiologist should be informed of the diagnosis and the severity of the sleep disordered breathing. The patient should be cautioned not to drive, work at heights, or operate dangerous or heavy equipment when tired or sleepy. Review and reiteration of good sleep hygiene measures should be pursued with any patient. The patient will be seen in follow-up by Dr. Frances Furbish at [facility] for discussion of the test results and further management strategies. The referring provider will be notified of the test results.  I certify that I have reviewed the entire raw data recording prior to the issuance of this report in accordance with the Standards of Accreditation of the American Academy of Sleep Medicine (AASM)   Huston Foley, MD, PhD Diplomat, American Board of Neurology and Sleep Medicine (Neurology and Sleep Medicine)

## 2021-11-09 ENCOUNTER — Telehealth: Payer: Self-pay | Admitting: *Deleted

## 2021-11-09 NOTE — Telephone Encounter (Signed)
Called pt & LVM asking for call back to discuss results. Also sent mychart message.

## 2021-11-09 NOTE — Telephone Encounter (Signed)
Order, Furniture conservator/restorer, office note, sleep study all faxed to [facility]. Received a receipt of confirmation.

## 2021-11-09 NOTE — Telephone Encounter (Signed)
Pt would like a call back to discuss results. Pt said she have some questions

## 2021-11-09 NOTE — Telephone Encounter (Signed)
-----   Message from Huston Foley, MD sent at 11/01/2021  6:24 PM EDT ----- Patient referred by Dr. Nadine Counts, seen by me on 10/11/21, diagnostic PSG on 10/25/21.    Please call and notify the patient that the recent sleep study showed mild (to moderate) obstructive sleep apnea. OSA is overall mild, but more pronounced during REM/dream sleep and worth treating to see if she feels better after treatment. To that end I recommend treatment in the form of autoPAP, which means, that we don't have to bring her back for a second sleep study with CPAP, but will let him try an autoPAP machine at home, through a DME company (of her choice, or as per insurance requirement). The DME representative will educate her on how to use the machine, how to put the mask on, etc. I have placed an order in the chart. Please send referral, talk to patient, send report to referring MD. We will need a FU in sleep clinic for 10 weeks post-PAP set up, please arrange that with me or one of our NPs. Thanks,   Huston Foley, MD, PhD [facility])

## 2021-11-19 ENCOUNTER — Other Ambulatory Visit: Payer: Self-pay | Admitting: Family Medicine

## 2021-11-20 ENCOUNTER — Other Ambulatory Visit: Payer: Self-pay | Admitting: Family Medicine

## 2021-11-20 DIAGNOSIS — M47816 Spondylosis without myelopathy or radiculopathy, lumbar region: Secondary | ICD-10-CM

## 2021-11-20 DIAGNOSIS — J208 Acute bronchitis due to other specified organisms: Secondary | ICD-10-CM

## 2021-12-07 DIAGNOSIS — G4733 Obstructive sleep apnea (adult) (pediatric): Secondary | ICD-10-CM | POA: Diagnosis not present

## 2021-12-13 NOTE — Telephone Encounter (Signed)
Resmed Airsense 11 Setup 12/07/21 (appt needed 01/07/22 - 03/09/22)

## 2021-12-17 ENCOUNTER — Other Ambulatory Visit: Payer: Self-pay | Admitting: Family Medicine

## 2021-12-17 DIAGNOSIS — B9689 Other specified bacterial agents as the cause of diseases classified elsewhere: Secondary | ICD-10-CM

## 2022-01-07 DIAGNOSIS — G4733 Obstructive sleep apnea (adult) (pediatric): Secondary | ICD-10-CM | POA: Diagnosis not present

## 2022-01-12 ENCOUNTER — Other Ambulatory Visit: Payer: Self-pay | Admitting: Family Medicine

## 2022-01-12 DIAGNOSIS — B9689 Other specified bacterial agents as the cause of diseases classified elsewhere: Secondary | ICD-10-CM

## 2022-01-23 ENCOUNTER — Ambulatory Visit: Payer: BC Managed Care – PPO | Admitting: Neurology

## 2022-01-23 ENCOUNTER — Encounter: Payer: Self-pay | Admitting: Neurology

## 2022-01-23 VITALS — BP 127/83 | HR 84 | Ht 62.0 in | Wt 252.2 lb

## 2022-01-23 DIAGNOSIS — Z9989 Dependence on other enabling machines and devices: Secondary | ICD-10-CM | POA: Diagnosis not present

## 2022-01-23 DIAGNOSIS — G4733 Obstructive sleep apnea (adult) (pediatric): Secondary | ICD-10-CM | POA: Diagnosis not present

## 2022-01-23 NOTE — Patient Instructions (Signed)
It was nice to see you again today. I am glad to hear, things are going well with your autoPAP therapy. You have adjusted well to treatment with your new machine, and you are fully compliant with it. You have also fulfilled the insurance-mandated compliance percentage, which is reassuring, so you can get ongoing supplies through your insurance. Please talk to your DME provider about getting replacement supplies on a regular basis. Please be sure to change your filter every month, your mask about every 3 months, hose about every 6 months, humidifier chamber about yearly. Some restrictions are imposed by your insurance carrier with regard to how frequently you can get certain supplies.  Your DME company can provide further details if necessary.   Please continue using your autoPAP regularly. While your insurance requires that you use PAP at least 4 hours each night on 70% of the nights, I recommend, that you not skip any nights and use it throughout the night if you can. Getting used to PAP and staying with the treatment long term does take time and patience and discipline. Untreated obstructive sleep apnea when it is moderate to severe can have an adverse impact on cardiovascular health and raise her risk for heart disease, arrhythmias, hypertension, congestive heart failure, stroke and diabetes. Untreated obstructive sleep apnea causes sleep disruption, nonrestorative sleep, and sleep deprivation. This can have an impact on your day to day functioning and cause daytime sleepiness and impairment of cognitive function, memory loss, mood disturbance, and problems focussing. Using PAP regularly can improve these symptoms.  We can see you in 1 year, you can see one of our nurse practitioners as you are stable.

## 2022-01-23 NOTE — Progress Notes (Signed)
Subjective:    Patient ID: Darlene [name] is a 51 y.o. female.  HPI    Interim history:    Darlene [name] is a 51 year old right-handed woman with an underlying medical history of reflux disease, hypokalemia, hypertension, osteoarthritis, vitamin D deficiency, planter fasciitis, elbow pain, and severe obesity with a BMI of over 73, who presents for follow-up consultation of her obstructive sleep apnea after interim testing and starting AutoPap therapy.  The patient is unaccompanied today. I first met her primary care physician on 10/11/2021, at which time she reported snoring and witnessed apneas as well as daytime somnolence.  She was advised to proceed with a sleep study.  She had a baseline sleep study on 10/25/2021 which indicated a delayed sleep latency of 118.5 minutes, mildly delayed REM latency of 125 minutes, sleep efficiency reduced at 74.5%.  She achieved all stages of sleep.  Her total AHI was 6.6/h, REM AHI 23.5/h, supine AHI 11/h and O2 nadir 86%.  She was encouraged to start a trial of AutoPap therapy.  Her set up date was 12/07/2021.  She has a ResMed AirSense 11 AutoSet machine.  Today, 01/23/2022: I reviewed her AutoPap compliance data from 12/20/2021 through 01/18/2022, which is a total of 30 days, during which time she used her machine every night with percent use days greater than 4 hours at 100%, indicating superb compliance with an average usage of 6 hours and 55 minutes, residual AHI at goal at 2.6/h, average pressure for the 95th percentile at 10 cm with a range of 5 to 10 cm with EPR, leak on the lower side with the 95th percentile leak 3.9 L/min.  She reports having adjusted well to treatment.  She used a small F30i fullface mask from ResMed initially and then switched to a medium Fisher Paykel Simplus fullface mask.  She prefers the Fisher-Paykel mask but it seems to bed, she would like a smaller interface.  I was able to provide her with a ResMed F20 AirTouch fullface mask sample to  try at home.  She feels that her sleep is better consolidated and she wakes up better rested.  She is motivated to continue with treatment.  She is compliant with treatment and also working on weight loss.  She would not mind trying to increase her maximum pressure to 11 cm. Her Epworth sleepiness score is 1 out of 24, previously at 12 out of 24.  Previously:   09/3021: (She) reports snoring and excessive daytime somnolence, as well as witnessed apneas per husband's report.  She makes gasping sounds and snoring sounds while asleep and also jerks in her sleep.  She endorses restless leg symptoms and takes Mirapex as needed for this.  I reviewed the office note from 07/01/2021.  Her Epworth sleepiness score is 12 out of 24, fatigue severity score is 37 out of 63.  She lives with her husband.  Their children are grown, they have 3 children.  They have no pets in the household.  She works 2 jobs and reports not waking up rested and having to catch up on sleep when she has days off.  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  She goes to bed generally between 11:30 PM and midnight and rise time is 6:45 AM.  She has no night to night nocturia but has woken up with  a headache.  She has a family history of sleep apnea, mom has sleep apnea but currently not on a CPAP machine.  Patient drinks caffeine in the form of soda, about 4 cans/day.  She does not drink any alcohol and does not smoke.  She has difficulty sleeping through the night.  Her sleep related symptoms have become worse over the past couple years. The patient's allergies, current medications, family history, past medical history, past social history, past surgical history and problem list were reviewed and updated as appropriate.    Her Past Medical History Is Significant For: Past Medical History:  Diagnosis Date   Fatty liver     Fatty liver    GERD (gastroesophageal reflux disease)    Hiatal hernia    Hypertension    Hypokalemia    OA (osteoarthritis)    Plantar fasciitis    Vitamin D deficiency     Her Past Surgical History Is Significant For: Past Surgical History:  Procedure Laterality Date   CESAREAN SECTION  1993   FOOT SURGERY Right 1997   INSERTION OF MESH N/A 01/28/2016   Procedure: INSERTION OF MESH;  Surgeon: Ralene Ok, MD;  Location: [facility];  Service: General;  Laterality: N/A;   INSERTION OF MESH  09/06/2018   Procedure: INSERTION OF MESH;  Surgeon: Ralene Ok, MD;  Location: [facility];  Service: General;;   Right Knee Surgery     tubal ligation      Her Family History Is Significant For: Family History  Problem Relation Age of Onset   High blood pressure Mother    Diabetes Mother    Kidney cancer Mother    Sleep apnea Mother    Hyperlipidemia Sister    Heart disease Sister    Heart disease Brother    Hyperlipidemia Brother    Colon cancer Neg Hx    Rectal cancer Neg Hx    Stomach cancer Neg Hx     Her Social History Is Significant For: Social History   Socioeconomic History   Marital status: Married    Spouse name: Not on file   Number of children: 3   Years of education: Not on file   Highest education level: Not on file  Occupational History   Occupation: TEXTILE  Tobacco Use   Smoking status: Never   Smokeless tobacco: Never  Vaping Use   Vaping Use: Never used  Substance and Sexual Activity   Alcohol use: No    Alcohol/week: 0.0 standard drinks of alcohol   Drug use: No   Sexual activity: Not on file  Other Topics Concern   Not on file  Social History Narrative   Not on file   Social Determinants of Health   Financial Resource Strain: Not on file  Food Insecurity: Not on file  Transportation Needs: Not on file  Physical Activity: Not on file  Stress: Not on file  Social Connections: Not on file    Her Allergies Are:  No Known Allergies:    Her Current Medications Are:  Outpatient Encounter Medications as of 01/23/2022  Medication Sig   albuterol (VENTOLIN HFA) 108 (90 Base) MCG/ACT inhaler INHALE 2 PUFFS BY MOUTH EVERY 4 HOURS AS NEEDED FOR WHEEZE OR FOR SHORTNESS OF BREATH   citalopram (CELEXA) 40 MG tablet TAKE 1 TABLET BY MOUTH EVERY DAY   cyclobenzaprine (FLEXERIL) 10 MG tablet Take 1 tablet (10 mg total) by mouth 3 (three) times daily as needed for muscle spasms. (Patient taking differently: Take  10 mg by mouth as needed for muscle spasms.)   gabapentin (NEURONTIN) 100 MG capsule TAKE 1 CAPSULE BY MOUTH TWICE A DAY   hydrochlorothiazide (HYDRODIURIL) 25 MG tablet TAKE 1 TABLET (25 MG TOTAL) BY MOUTH DAILY WITH BREAKFAST.   ibuprofen (ADVIL) 800 MG tablet TAKE 1 TABLET BY MOUTH THREE TIMES A DAY   loratadine (CLARITIN) 10 MG tablet TAKE 1 TABLET BY MOUTH EVERY DAY   metoprolol succinate (TOPROL-XL) 25 MG 24 hr tablet TAKE 1 TABLET (25 MG TOTAL) BY MOUTH DAILY.   pantoprazole (PROTONIX) 40 MG tablet Take 1 tablet (40 mg total) by mouth 2 (two) times daily.   Plecanatide (TRULANCE) 3 MG TABS Take 3 mg by mouth daily. For constipation.  Bringing in coupon   Potassium Chloride ER 20 MEQ TBCR Take 1 tablet by mouth in the morning and at bedtime. For low potassium   pramipexole (MIRAPEX) 0.25 MG tablet Take 0.75 mg by mouth 2 (two) times daily. Supper and bedtime   [DISCONTINUED] benzonatate (TESSALON) 100 MG capsule Take 1 capsule (100 mg total) by mouth 2 (two) times daily as needed for cough.   [DISCONTINUED] promethazine-dextromethorphan (PROMETHAZINE-DM) 6.25-15 MG/5ML syrup Take 5 mLs by mouth 4 (four) times daily as needed for cough.   No facility-administered encounter medications on file as of 01/23/2022.  :  Review of Systems:  Out of a complete 14 point review of systems, all are reviewed and negative with the exception of these symptoms as listed below:   Review of Systems  Neurological:        Following initial  cpap.  Tolerating ok, had to change mask.   Noted feels more rested. ESS 1.     Objective:  Neurological Exam  Physical Exam Physical Examination:   Vitals:   01/23/22 0950  BP: 127/83  Pulse: 84    General Examination: The patient is a very pleasant 51 y.o. female in no acute distress. She appears well-developed and well-nourished and well groomed.   HEENT: Normocephalic, atraumatic, pupils are equal, round and reactive to light, extraocular tracking is good without limitation to gaze excursion or nystagmus noted. Hearing is grossly intact. Face is symmetric with normal facial animation. Speech is clear with no dysarthria noted. There is no hypophonia. There is no lip, neck/head, jaw or voice tremor. Neck is supple with full range of passive and active motion. There are no carotid bruits on auscultation. Oropharynx exam reveals: No significant mouth dryness, adequate dental hygiene, moderate airway crowding.  Tongue protrudes centrally and palate elevates symmetrically.    Chest: Clear to auscultation without wheezing, rhonchi or crackles noted.   Heart: S1+S2+0, regular and normal without murmurs, rubs or gallops noted.    Abdomen: Soft, non-tender and non-distended.   Extremities: There is no pitting edema in the distal lower extremities bilaterally.    Skin: Warm and dry without trophic changes noted.    Musculoskeletal: exam reveals no obvious joint deformities.    Neurologically:  Mental status: The patient is awake, alert and oriented in all 4 spheres. Her immediate and remote memory, attention, language skills and fund of knowledge are appropriate. There is no evidence of aphasia, agnosia, apraxia or anomia. Speech is clear with normal prosody and enunciation. Thought process is linear. Mood is normal and affect is normal.  Cranial nerves II - XII are as described above under HEENT exam.  Motor exam: Normal bulk, strength and tone is noted. There is no obvious tremor. Fine  motor skills and coordination:  grossly intact.  Cerebellar testing: No dysmetria or intention tremor. There is no truncal or gait ataxia.  Sensory exam: intact to light touch in the upper and lower extremities.  Gait, station and balance: She stands easily. No veering to one side is noted. No leaning to one side is noted. Posture is age-appropriate and stance is narrow based. Gait shows normal stride length and normal pace. No problems turning are noted.    Assessment and Plan:  In summary, Darlene [name] is a very pleasant 51 year old female with an underlying medical history of reflux disease, hypokalemia, hypertension, osteoarthritis, vitamin D deficiency, planter fasciitis, elbow pain, and severe obesity with a BMI of over 26, who presents for follow-up consultation of her obstructive sleep apnea which was deemed in the mild to moderate range during a baseline sleep study on 10/25/2021.  Total AHI was 6.6/h, REM AHI 23.5/h, supine AHI 11/h and O2 nadir 86%.  She has been on AutoPap therapy since 12/07/2021.  She is fully compliant with treatment, apnea score is at goal, she is at the top end of the pressure range which is currently at 10 cm.  We mutually agreed to increase her maximum AutoPap pressure to 11 cm at this time.  I would be happy to review her compliance report in a month or so.  She is commended for her treatment adherence.  She has benefited from treatment and motivated to continue present.  She is advised to follow-up routinely to see one of our nurse practitioners in 1 year.  She is advised to try the new sample mask, ResMed AirTouch F20 fullface mask and size small.  If she likes that she can reorder it through her DME provider.  She can also reorder a small Simplus full facemask through her DME.  We reviewed her sleep study results in detail and reviewed her compliance data as well.  I answered all her questions today and she was in agreement with our plan.  I spent 40 minutes in total  face-to-face time and in reviewing records during pre-charting, more than 50% of which was spent in counseling and coordination of care, reviewing test results, reviewing medications and treatment regimen and/or in discussing or reviewing the diagnosis of OSA on CPAP, the prognosis and treatment options. Pertinent laboratory and imaging test results that were available during this visit with the patient were reviewed by me and considered in my medical decision making (see chart for details).

## 2022-02-07 DIAGNOSIS — G4733 Obstructive sleep apnea (adult) (pediatric): Secondary | ICD-10-CM | POA: Diagnosis not present

## 2022-02-08 ENCOUNTER — Other Ambulatory Visit: Payer: Self-pay | Admitting: Family Medicine

## 2022-02-08 DIAGNOSIS — B9689 Other specified bacterial agents as the cause of diseases classified elsewhere: Secondary | ICD-10-CM

## 2022-02-08 NOTE — Telephone Encounter (Signed)
Lmtcb.

## 2022-02-08 NOTE — Telephone Encounter (Signed)
Please verify pt is requesting and this is not an auto request.  If she is requesting, please get her scheduled in office with me for recheck.  I'm concerned if she is requiring this at this stage.

## 2022-02-20 ENCOUNTER — Other Ambulatory Visit: Payer: Self-pay | Admitting: Family Medicine

## 2022-02-20 DIAGNOSIS — K219 Gastro-esophageal reflux disease without esophagitis: Secondary | ICD-10-CM

## 2022-02-27 ENCOUNTER — Telehealth: Payer: BC Managed Care – PPO | Admitting: Family Medicine

## 2022-02-27 DIAGNOSIS — R21 Rash and other nonspecific skin eruption: Secondary | ICD-10-CM | POA: Diagnosis not present

## 2022-02-27 MED ORDER — NYSTATIN-TRIAMCINOLONE 100000-0.1 UNIT/GM-% EX OINT: 1.0000 | TOPICAL_OINTMENT | Freq: Two times a day (BID) | CUTANEOUS | 0 refills | Status: DC

## 2022-02-27 NOTE — Progress Notes (Signed)
E Visit for Rash  We are sorry that you are not feeling well. Here is how we plan to help!   Based upon your presentation it appears you have a fungal infection.  I have prescribed: and Nystatin cream apply to the affected area twice daily If it worsens or fails to improve with cream- please be seen in person.  HOME CARE:  Take cool showers and avoid direct sunlight. Apply cool compress or wet dressings. Take a bath in an oatmeal bath.  Sprinkle content of one Aveeno packet under running faucet with comfortably warm water.  Bathe for 15-20 minutes, 1-2 times daily.  Pat dry with a towel. Do not rub the rash. Use hydrocortisone cream. Take an antihistamine like Benadryl for widespread rashes that itch.  The adult dose of Benadryl is 25-50 mg by mouth 4 times daily. Caution:  This type of medication may cause sleepiness.  Do not drink alcohol, drive, or operate dangerous machinery while taking antihistamines.  Do not take these medications if you have prostate enlargement.  Read package instructions thoroughly on all medications that you take.  GET HELP RIGHT AWAY IF:  Symptoms don't go away after treatment. Severe itching that persists. If you rash spreads or swells. If you rash begins to smell. If it blisters and opens or develops a yellow-brown crust. You develop a fever. You have a sore throat. You become short of breath.  MAKE SURE YOU:  Understand these instructions. Will watch your condition. Will get help right away if you are not doing well or get worse.  Thank you for choosing an e-visit.  Your e-visit answers were reviewed by a board certified advanced clinical practitioner to complete your personal care plan. Depending upon the condition, your plan could have included both over the counter or prescription medications.  Please review your pharmacy choice. Make sure the pharmacy is open so you can pick up prescription now. If there is a problem, you may contact your  provider through [employer] and have the prescription routed to another pharmacy.  Your safety is important to [place]. If you have drug allergies check your prescription carefully.   For the next 24 hours you can use MyChart to ask questions about today's visit, request a non-urgent call back, or ask for a work or school excuse. You will get an email in the next two days asking about your experience. I hope that your e-visit has been valuable and will speed your recovery.  I provided 5 minutes of non face-to-face time during this encounter for chart review, medication and order placement, as well as and documentation.

## 2022-03-09 DIAGNOSIS — G4733 Obstructive sleep apnea (adult) (pediatric): Secondary | ICD-10-CM | POA: Diagnosis not present

## 2022-03-18 ENCOUNTER — Other Ambulatory Visit: Payer: Self-pay | Admitting: Family Medicine

## 2022-03-18 DIAGNOSIS — R21 Rash and other nonspecific skin eruption: Secondary | ICD-10-CM

## 2022-03-20 MED ORDER — NYSTATIN-TRIAMCINOLONE 100000-0.1 UNIT/GM-% EX OINT: 1.0000 | TOPICAL_OINTMENT | Freq: Two times a day (BID) | CUTANEOUS | 0 refills | Status: DC

## 2022-03-27 ENCOUNTER — Other Ambulatory Visit: Payer: Self-pay | Admitting: Family Medicine

## 2022-03-27 DIAGNOSIS — I471 Supraventricular tachycardia, unspecified: Secondary | ICD-10-CM

## 2022-04-13 ENCOUNTER — Other Ambulatory Visit: Payer: Self-pay | Admitting: Family Medicine

## 2022-05-03 ENCOUNTER — Telehealth: Payer: BC Managed Care – PPO | Admitting: Physician Assistant

## 2022-05-03 DIAGNOSIS — J208 Acute bronchitis due to other specified organisms: Secondary | ICD-10-CM

## 2022-05-03 MED ORDER — BENZONATATE 100 MG PO CAPS: 100.0000 mg | ORAL_CAPSULE | Freq: Three times a day (TID) | ORAL | 0 refills | Status: DC | PRN

## 2022-05-03 MED ORDER — PREDNISONE 20 MG PO TABS: 40.0000 mg | ORAL_TABLET | Freq: Every day | ORAL | 0 refills | Status: DC

## 2022-05-03 NOTE — Progress Notes (Signed)
We are sorry that you are not feeling well.  Here is how we plan to help!  Based on your presentation I believe you most likely have A cough due to a virus.  This is called viral bronchitis and is best treated by rest, plenty of fluids and control of the cough.  You may use Ibuprofen or Tylenol as directed to help your symptoms.     In addition you may use A prescription cough medication called Tessalon Perles 100mg . You may take 1-2 capsules every 8 hours as needed for your cough. I have sent in a prednisone burst to take as directed.   From your responses in the eVisit questionnaire you describe inflammation in the upper respiratory tract which is causing a significant cough.  This is commonly called Bronchitis and has four common causes:   Allergies Viral Infections Acid Reflux Bacterial Infection Allergies, viruses and acid reflux are treated by controlling symptoms or eliminating the cause. An example might be a cough caused by taking certain blood pressure medications. You stop the cough by changing the medication. Another example might be a cough caused by acid reflux. Controlling the reflux helps control the cough.  USE OF BRONCHODILATOR ("RESCUE") INHALERS: There is a risk from using your bronchodilator too frequently.  The risk is that over-reliance on a medication which only relaxes the muscles surrounding the breathing tubes can reduce the effectiveness of medications prescribed to reduce swelling and congestion of the tubes themselves.  Although you feel brief relief from the bronchodilator inhaler, your asthma may actually be worsening with the tubes becoming more swollen and filled with mucus.  This can delay other crucial treatments, such as oral steroid medications. If you need to use a bronchodilator inhaler daily, several times per day, you should discuss this with your provider.  There are probably better treatments that could be used to keep your asthma under control.     HOME  CARE Only take medications as instructed by your medical team. Complete the entire course of an antibiotic. Drink plenty of fluids and get plenty of rest. Avoid close contacts especially the very young and the elderly Cover your mouth if you cough or cough into your sleeve. Always remember to wash your hands A steam or ultrasonic humidifier can help congestion.   GET HELP RIGHT AWAY IF: You develop worsening fever. You become short of breath You cough up blood. Your symptoms persist after you have completed your treatment plan MAKE SURE YOU  Understand these instructions. Will watch your condition. Will get help right away if you are not doing well or get worse.    Thank you for choosing an e-visit.  Your e-visit answers were reviewed by a board certified advanced clinical practitioner to complete your personal care plan. Depending upon the condition, your plan could have included both over the counter or prescription medications.  Please review your pharmacy choice. Make sure the pharmacy is open so you can pick up prescription now. If there is a problem, you may contact your provider through and have the prescription routed to another pharmacy.  Your safety is important to [employer]. If you have drug allergies check your prescription carefully.   For the next 24 hours you can use MyChart to ask questions about today's visit, request a non-urgent call back, or ask for a work or school excuse. You will get an email in the next two days asking about your experience. I hope that your e-visit has been  valuable and will speed your recovery.

## 2022-05-03 NOTE — Progress Notes (Signed)
I have spent 5 minutes in review of e-visit questionnaire, review and updating patient chart, medical decision making and response to patient.   Geary Rufo Cody Lucita Montoya, PA-C    

## 2022-05-15 ENCOUNTER — Other Ambulatory Visit: Payer: Self-pay | Admitting: Family Medicine

## 2022-05-15 DIAGNOSIS — K219 Gastro-esophageal reflux disease without esophagitis: Secondary | ICD-10-CM

## 2022-05-19 ENCOUNTER — Other Ambulatory Visit: Payer: Self-pay | Admitting: Family Medicine

## 2022-05-20 ENCOUNTER — Other Ambulatory Visit: Payer: Self-pay | Admitting: Family Medicine

## 2022-05-20 DIAGNOSIS — K219 Gastro-esophageal reflux disease without esophagitis: Secondary | ICD-10-CM

## 2022-06-09 DIAGNOSIS — G4733 Obstructive sleep apnea (adult) (pediatric): Secondary | ICD-10-CM | POA: Diagnosis not present

## 2022-06-15 ENCOUNTER — Encounter: Payer: Self-pay | Admitting: Family Medicine

## 2022-06-16 ENCOUNTER — Telehealth: Payer: BC Managed Care – PPO | Admitting: Family Medicine

## 2022-06-16 ENCOUNTER — Encounter: Payer: Self-pay | Admitting: Family Medicine

## 2022-06-16 ENCOUNTER — Ambulatory Visit: Payer: BC Managed Care – PPO | Admitting: Family Medicine

## 2022-06-16 VITALS — BP 107/73 | HR 74 | Ht 62.0 in | Wt 257.0 lb

## 2022-06-16 DIAGNOSIS — F410 Panic disorder [episodic paroxysmal anxiety] without agoraphobia: Secondary | ICD-10-CM

## 2022-06-16 DIAGNOSIS — R0789 Other chest pain: Secondary | ICD-10-CM

## 2022-06-16 DIAGNOSIS — R0602 Shortness of breath: Secondary | ICD-10-CM

## 2022-06-16 DIAGNOSIS — R002 Palpitations: Secondary | ICD-10-CM

## 2022-06-16 MED ORDER — HYDROXYZINE HCL 10 MG PO TABS: 10.0000 mg | ORAL_TABLET | Freq: Three times a day (TID) | ORAL | 1 refills | Status: DC | PRN

## 2022-06-16 NOTE — Progress Notes (Signed)
BP 107/73   Pulse 74   Ht 5\' 2"  (1.575 m)   Wt 257 lb (116.6 kg)   SpO2 97%   BMI 47.01 kg/m    Subjective:   Patient ID: Darlene [name], female    DOB: May 10, 1971, 52 y.o.   MRN: [id number]  HPI: Darlene [name] is a 52 y.o. female presenting on 06/16/2022 for Anxiety (Mom not doing well. Had episode 3-4 days ago, chest pressure, hard to breathe, heart flutters)   HPI Chest pressure and palpitations Patient comes in today complaining of chest pressure and palpitations and feeling of cannot breathe that is been going on for the past 3 to 4 days.  She says 3 to 4 days ago she did find out that her mother who is on hospice is not doing really well and that trend started this.  She says it has been constant and not going away over the past 3 to 4 days.  She has never had this to this extent before.  She feels like it is there all the time and not dependent on whether or not she is doing exertion or at rest.  It has kept her up at night at times.  She says she has been so short of breath at times or feeling palpitations or pressure at times that she has had to remove her CPAP which she normally wears all of the time.  Relevant past medical, surgical, family and social history reviewed and updated as indicated. Interim medical history since our last visit reviewed. Allergies and medications reviewed and updated.  Review of Systems  Constitutional:  Negative for chills and fever.  HENT:  Negative for congestion, ear discharge and ear pain.   Eyes:  Negative for redness and visual disturbance.  Respiratory:  Positive for chest tightness and shortness of breath. Negative for cough.   Cardiovascular:  Positive for chest pain and palpitations. Negative for leg swelling.  Gastrointestinal:  Negative for abdominal pain.  Genitourinary:  Negative for difficulty urinating and dysuria.  Skin:  Negative for rash.  Neurological:  Negative for light-headedness and headaches.   Psychiatric/Behavioral:  Positive for dysphoric mood and sleep disturbance. Negative for agitation, behavioral problems, self-injury and suicidal ideas. The patient is nervous/anxious.   All other systems reviewed and are negative.   Per HPI unless specifically indicated above   Allergies as of 06/16/2022   No Known Allergies      Medication List        Accurate as of June 16, 2022  9:56 AM. If you have any questions, ask your nurse or doctor.          STOP taking these medications    benzonatate 100 MG capsule Commonly known as: TESSALON Stopped by: Worthy Rancher, MD   nystatin-triamcinolone ointment Commonly known as: MYCOLOG Stopped by: Fransisca Kaufmann Laquana Villari, MD   predniSONE 20 MG tablet Commonly known as: DELTASONE Stopped by: Fransisca Kaufmann Ayva Veilleux, MD       TAKE these medications    albuterol 108 (90 Base) MCG/ACT inhaler Commonly known as: VENTOLIN HFA INHALE 2 PUFFS BY MOUTH EVERY 4 HOURS AS NEEDED FOR WHEEZE OR FOR SHORTNESS OF BREATH   citalopram 40 MG tablet Commonly known as: CELEXA TAKE 1 TABLET BY MOUTH EVERY DAY What changed: how much to take   cyclobenzaprine 10 MG tablet Commonly known as: FLEXERIL Take 1 tablet (10 mg total) by mouth 3 (three) times daily as needed for muscle spasms. What changed:  when to take this   gabapentin 100 MG capsule Commonly known as: NEURONTIN TAKE 1 CAPSULE BY MOUTH TWICE A DAY   hydrochlorothiazide 25 MG tablet Commonly known as: HYDRODIURIL TAKE 1 TABLET (25 MG TOTAL) BY MOUTH DAILY WITH BREAKFAST.   hydrOXYzine 10 MG tablet Commonly known as: ATARAX Take 1 tablet (10 mg total) by mouth 3 (three) times daily as needed. Started by: Fransisca Kaufmann Jhoan Schmieder, MD   ibuprofen 800 MG tablet Commonly known as: ADVIL TAKE 1 TABLET BY MOUTH THREE TIMES A DAY   loratadine 10 MG tablet Commonly known as: CLARITIN TAKE 1 TABLET BY MOUTH EVERY DAY   metoprolol succinate 25 MG 24 hr tablet Commonly known as:  TOPROL-XL TAKE 1 TABLET (25 MG TOTAL) BY MOUTH DAILY.   pantoprazole 40 MG tablet Commonly known as: PROTONIX TAKE 1 TABLET BY MOUTH TWICE A DAY   Potassium Chloride ER 20 MEQ Tbcr Take 1 tablet by mouth in the morning and at bedtime. For low potassium   pramipexole 0.25 MG tablet Commonly known as: MIRAPEX Take 0.75 mg by mouth 2 (two) times daily. Supper and bedtime   Trulance 3 MG Tabs Generic drug: Plecanatide Take 3 mg by mouth daily. For constipation.  Bringing in coupon         Objective:   BP 107/73   Pulse 74   Ht 5\' 2"  (1.575 m)   Wt 257 lb (116.6 kg)   SpO2 97%   BMI 47.01 kg/m   Wt Readings from Last 3 Encounters:  06/16/22 257 lb (116.6 kg)  01/23/22 252 lb 3.2 oz (114.4 kg)  10/11/21 252 lb 6.4 oz (114.5 kg)    Physical Exam Vitals and nursing note reviewed.  Constitutional:      General: She is not in acute distress.    Appearance: She is well-developed. She is not diaphoretic.  Eyes:     Conjunctiva/sclera: Conjunctivae normal.  Cardiovascular:     Rate and Rhythm: Normal rate and regular rhythm.     Heart sounds: Normal heart sounds. No murmur heard. Pulmonary:     Effort: Pulmonary effort is normal. No respiratory distress.     Breath sounds: Normal breath sounds. No wheezing, rhonchi or rales.  Abdominal:     General: Abdomen is flat. Bowel sounds are normal. There is no distension.     Tenderness: There is no abdominal tenderness. There is no right CVA tenderness, left CVA tenderness, guarding or rebound.  Musculoskeletal:        General: No swelling or tenderness. Normal range of motion.  Skin:    General: Skin is warm and dry.     Findings: No rash.  Neurological:     Mental Status: She is alert and oriented to person, place, and time.     Coordination: Coordination normal.  Psychiatric:        Behavior: Behavior normal.     EKG: EKG shows normal send this rhythm with a heart rate of 70.  The 10-year ASCVD risk score (Arnett  DK, et al., 2019) is: 0.6%   Values used to calculate the score:     Age: 66 years     Sex: Female     Is Non-Hispanic African American: No     Diabetic: No     Tobacco smoker: No     Systolic Blood Pressure: 742 mmHg     Is BP treated: No     HDL Cholesterol: 53 mg/dL     Total Cholesterol:  151 mg/dL   Assessment & Plan:   Problem List Items Addressed This Visit   None Visit Diagnoses     Chest pressure    -  Primary   Relevant Medications   hydrOXYzine (ATARAX) 10 MG tablet   Other Relevant Orders   EKG 12-Lead (Completed)   Ambulatory referral to Cardiology   Palpitations       Relevant Medications   hydrOXYzine (ATARAX) 10 MG tablet   Other Relevant Orders   EKG 12-Lead (Completed)   Ambulatory referral to Cardiology   Shortness of breath       Relevant Medications   hydrOXYzine (ATARAX) 10 MG tablet   Other Relevant Orders   EKG 12-Lead (Completed)   Ambulatory referral to Cardiology   Anxiety attack       Relevant Medications   hydrOXYzine (ATARAX) 10 MG tablet     Patient has a low cardiovascular risk score but she does have high blood pressure and sleep apnea and is almost 52 so we will send her to cardiology for possible testing.  Will give hydroxyzine to help calm her nerves down that she can use as needed.  Follow up plan: Return if symptoms worsen or fail to improve.  Counseling provided for all of the vaccine components Orders Placed This Encounter  Procedures   Ambulatory referral to Cardiology   EKG 12-Lead    Caryl Pina, MD [facility] 06/16/2022, 9:56 AM

## 2022-06-19 ENCOUNTER — Other Ambulatory Visit: Payer: Self-pay | Admitting: Family Medicine

## 2022-06-19 DIAGNOSIS — K219 Gastro-esophageal reflux disease without esophagitis: Secondary | ICD-10-CM

## 2022-06-19 DIAGNOSIS — E876 Hypokalemia: Secondary | ICD-10-CM

## 2022-06-19 DIAGNOSIS — R21 Rash and other nonspecific skin eruption: Secondary | ICD-10-CM

## 2022-06-19 DIAGNOSIS — M47816 Spondylosis without myelopathy or radiculopathy, lumbar region: Secondary | ICD-10-CM

## 2022-06-19 DIAGNOSIS — K5909 Other constipation: Secondary | ICD-10-CM

## 2022-06-22 ENCOUNTER — Encounter: Payer: Self-pay | Admitting: Cardiovascular Disease

## 2022-06-22 NOTE — Progress Notes (Signed)
Cardiology Office Note:    Date:  06/23/2022   ID:  Darlene [name], DOB 08/29/1970, MRN [id number]  PCP:  Janora Norlander, DO   [facility] Cardiologist:  Latriece Anstine    Referring MD: Janora Norlander, DO   Chief Complaint  Patient presents with   Chest Pain    History of Present Illness:    Darlene [name] is a 52 y.o. female with a hx of chest pressure, HTN, palpitations , dyspnea,  OSA   Has chest pressure , palpitations, dyspnea  for several week ,   Mid chest pressure , does not radiate,  associated with dyspnea and palps.  May have some lightheadedness.   Does not eat regularly ,  Snacks regularly  - she knows its not a healthy way to eat  Sundrop all day long   We discussed healthy healthy eating,  Needs to cut out the snacks Eliminate sodas    Feels like her heart is beating fast ,  feels like butterflys  May last for few seconds to a few minutes.   Has OSA , is compliant with hger CPAP .    Does not get any exercise  Works at [name] in [place] .  So work is active .   Does not walk   Occurs at rest and with working .   She has had an irreg HR and was put on metoprolol several years ago   Wt is 254 lbs    Fam hx  mother has CHF Brother had MI in his 48s      Past Medical History:  Diagnosis Date   Chest pressure    Fatty liver    Fatty liver    GERD (gastroesophageal reflux disease)    Hiatal hernia    Hypertension    Hypokalemia    OA (osteoarthritis)    Palpitations    Plantar fasciitis    SOB (shortness of breath)    Vitamin D deficiency     Past Surgical History:  Procedure Laterality Date   [place] N/A 01/28/2016   Procedure: INSERTION OF MESH;  Surgeon: Ralene Ok, MD;  Location: [facility];  Service: General;  Laterality: N/A;   INSERTION OF MESH  09/06/2018   Procedure: INSERTION OF MESH;  Surgeon: Ralene Ok, MD;  Location: [facility]  ORS;  Service: General;;   Right Knee Surgery     tubal ligation      Current Medications: Current Meds  Medication Sig   albuterol (VENTOLIN HFA) 108 (90 Base) MCG/ACT inhaler INHALE 2 PUFFS BY MOUTH EVERY 4 HOURS AS NEEDED FOR WHEEZE OR FOR SHORTNESS OF BREATH   citalopram (CELEXA) 40 MG tablet TAKE 1 TABLET BY MOUTH EVERY DAY (Patient taking differently: Take 20 mg by mouth daily.)   cyclobenzaprine (FLEXERIL) 10 MG tablet Take 1 tablet (10 mg total) by mouth 3 (three) times daily as needed for muscle spasms. (Patient taking differently: Take 10 mg by mouth as needed for muscle spasms.)   gabapentin (NEURONTIN) 100 MG capsule TAKE 1 CAPSULE BY MOUTH TWICE A DAY   hydrochlorothiazide (HYDRODIURIL) 25 MG tablet TAKE 1 TABLET (25 MG TOTAL) BY MOUTH DAILY WITH BREAKFAST.   hydrOXYzine (ATARAX) 10 MG tablet Take 1 tablet (10 mg total) by mouth 3 (three) times daily as needed.   ibuprofen (ADVIL) 800 MG tablet TAKE 1 TABLET BY MOUTH THREE TIMES A DAY  loratadine (CLARITIN) 10 MG tablet TAKE 1 TABLET BY MOUTH EVERY DAY   metoprolol succinate (TOPROL-XL) 25 MG 24 hr tablet TAKE 1 TABLET (25 MG TOTAL) BY MOUTH DAILY.   pantoprazole (PROTONIX) 40 MG tablet TAKE 1 TABLET BY MOUTH TWICE A DAY   Plecanatide (TRULANCE) 3 MG TABS TAKE 3 MG BY MOUTH DAILY. FOR CONSTIPATION. BRINGING IN COUPON   Potassium Chloride ER 20 MEQ TBCR TAKE 1 TABLET BY MOUTH IN THE MORNING AND AT BEDTIME. FOR LOW POTASSIUM     Allergies:   Patient has no known allergies.   Social History   Socioeconomic History   Marital status: Married    Spouse name: Not on file   Number of children: 3   Years of education: Not on file   Highest education level: Not on file  Occupational History   Occupation: TEXTILE  Tobacco Use   Smoking status: Never   Smokeless tobacco: Never  Vaping Use   Vaping Use: Never used  Substance and Sexual Activity   Alcohol use: No    Alcohol/week: 0.0 standard drinks of alcohol   Drug use: No    Sexual activity: Not on file  Other Topics Concern   Not on file  Social History Narrative   Not on file   Social Determinants of Health   Financial Resource Strain: Not on file  Food Insecurity: Not on file  Transportation Needs: Not on file  Physical Activity: Not on file  Stress: Not on file  Social Connections: Not on file     Family History: The patient's family history includes Diabetes in her mother; Heart disease in her brother and sister; High blood pressure in her mother; Hyperlipidemia in her brother and sister; Kidney cancer in her mother; Sleep apnea in her mother. There is no history of Colon cancer, Rectal cancer, or Stomach cancer.  ROS:   Please see the history of present illness.     All other systems reviewed and are negative.  EKGs/Labs/Other Studies Reviewed:    The following studies were reviewed today:   EKG: June 23, 2022: Normal sinus rhythm at 72.  Normal EKG.  Recent Labs: 07/18/2021: ALT 15; BUN 15; Creatinine, Ser 0.78; Hemoglobin 13.9; Platelets 225; Potassium 3.4; Sodium 137; TSH 1.060  Recent Lipid Panel    Component Value Date/Time   CHOL 151 07/18/2021 0929   TRIG 108 07/18/2021 0929   HDL 53 07/18/2021 0929   CHOLHDL 2.8 07/18/2021 0929   LDLCALC 78 07/18/2021 0929   LDLDIRECT 109 (H) 03/09/2020 1615     Risk Assessment/Calculations:                Physical Exam:    VS:  BP 118/70   Pulse 72   Ht 5' 2"$  (1.575 m)   Wt 254 lb 12.8 oz (115.6 kg)   SpO2 98%   BMI 46.60 kg/m     Wt Readings from Last 3 Encounters:  06/23/22 254 lb 12.8 oz (115.6 kg)  06/16/22 257 lb (116.6 kg)  01/23/22 252 lb 3.2 oz (114.4 kg)     GEN:  middle age, morbidly obese female ,  in no acute distress HEENT: Normal NECK: No JVD; No carotid bruits LYMPHATICS: No lymphadenopathy CARDIAC: RRR, no murmurs, rubs, gallops RESPIRATORY:  Clear to auscultation without rales, wheezing or rhonchi  ABDOMEN: Soft, non-tender,  non-distended MUSCULOSKELETAL:  No edema; No deformity  SKIN: Warm and dry NEUROLOGIC:  Alert and oriented x 3 PSYCHIATRIC:  Normal affect  ASSESSMENT:    1. Palpitations   2. Chest tightness   3. Dyspnea, unspecified type   4. Pre-procedural cardiovascular examination    PLAN:    In order of problems listed above:  Chest tightness, dyspnea, palpitations: The patient has episodes of severe chest tightness and shortness of breath.  These are also associated with palpitations.  It is unclear how these episodes start.  She has these throughout the day.  They are not necessarily associated with exercise.  They are not associated with eating, drinking, stretching. For further evaluation I would like to order a 14-day event monitor.  I would also like to get a coronary CT scan for further evaluation of her chest tightness.  Will also get an echocardiogram for further evaluation of her cardiac function and valvular function.  Will follow-up with her in several months for follow-up visit.   2.  Morbid obesity: Strongly encouraged her to work on weight loss.  She snacks constantly.  She drinks Sundrop  throughout the day.  Ive asked her to cut out the soda, drink water or unsweetened tea.    She needs to exercise             Medication Adjustments/Labs and Tests Ordered: Current medicines are reviewed at length with the patient today.  Concerns regarding medicines are outlined above.  Orders Placed This Encounter  Procedures   CT CORONARY MORPH W/CTA COR W/SCORE W/CA W/CM &/OR WO/CM   Basic metabolic panel   LONG TERM MONITOR (3-14 DAYS)   EKG 12-Lead   ECHOCARDIOGRAM COMPLETE   No orders of the defined types were placed in this encounter.   Patient Instructions  Medication Instructions:  Your physician recommends that you continue on your current medications as directed. Please refer to the Current Medication list given to you today.  *If you need a refill on your cardiac  medications before your next appointment, please call your pharmacy*   Lab Work: BMET today If you have labs (blood work) drawn today and your tests are completely normal, you will receive your results only by: [facility] [place] [facility] (if you have MyChart) OR A paper copy in the mail If you have any lab test that is abnormal or we need to change your treatment, we will call you to review the results.   Testing/Procedures: ECHO Your physician has requested that you have an echocardiogram. Echocardiography is a painless test that uses sound waves to create images of your heart. It provides your doctor with information about the size and shape of your heart and how well your heart's chambers and valves are working. This procedure takes approximately one hour. There are no restrictions for this procedure. Please do NOT wear cologne, perfume, aftershave, or lotions (deodorant is allowed). Please arrive 15 minutes prior to your appointment time.  14-day zio monitor Your physician has recommended that you wear an event monitor. Event monitors are medical devices that record the heart's electrical activity. Doctors most often [place] these monitors to diagnose arrhythmias. Arrhythmias are problems with the speed or rhythm of the heartbeat. The monitor is a small, portable device. You can wear one while you do your normal daily activities. This is usually used to diagnose what is causing palpitations/syncope (passing out).  Coronary CTA Your physician has requested that you have cardiac CT. Cardiac computed tomography (CT) is a painless test that uses an x-ray machine to take clear, detailed pictures of your heart. For further information please visit [email or web address].[email or web address]. Please follow instruction sheet  as given.  Follow-Up: At [facility], you and your health needs are our priority.  As part of our continuing mission to provide you with exceptional heart care, we have created designated Provider Care  Teams.  These Care Teams include your primary Cardiologist (physician) and Advanced Practice Providers (APPs -  Physician Assistants and Nurse Practitioners) who all work together to provide you with the care you need, when you need it.  Your next appointment:   3 month(s)  Provider:   Mertie Moores, MD   Other Instructions   Your cardiac CT will be scheduled at:   [facility] [address] [place], [place] [address] [phone]  please arrive at the [facility] and Children's Entrance (Entrance C2) of [facility] 30 minutes prior to test start time. You can use the FREE valet parking offered at entrance C (encouraged to control the heart rate for the test)  Proceed to the [facility] Radiology Department (first floor) to check-in and test prep.  All radiology patients and guests should use entrance C2 at [facility], accessed from [facility], even though the hospital's physical address listed is [address]     Please follow these instructions carefully (unless otherwise directed):  On the Night Before the Test: Be sure to Drink plenty of water. Do not consume any caffeinated/decaffeinated beverages or chocolate 12 hours prior to your test. Do not take any antihistamines 12 hours prior to your test.  On the Day of the Test: Drink plenty of water until 1 hour prior to the test. Do not eat any food 1 hour prior to test. You may take your regular medications prior to the test.  Take metoprolol (two tablets-90m) two hours prior to test. If you take Hydrochlorothiazide please HOLD on the morning of the test. FEMALES- please wear underwire-free bra if available, avoid dresses & tight clothing    After the Test: Drink plenty of water. After receiving IV contrast, you may experience a mild flushed feeling. This is normal. On occasion, you may experience a mild rash up to 24 hours after the test. This is not dangerous. If this  occurs, you can take Benadryl 25 mg and increase your fluid intake. If you experience trouble breathing, this can be serious. If it is severe call 911 IMMEDIATELY. If it is mild, please call our office. If you take any of these medications: Glipizide/Metformin, Avandament, Glucavance, please do not take 48 hours after completing test unless otherwise instructed.  We will call to schedule your test 2-4 weeks out understanding that some insurance companies will need an authorization prior to the service being performed.   For non-scheduling related questions, please contact the cardiac imaging nurse navigator should you have any questions/concerns: SMarchia Bond Cardiac Imaging Nurse Navigator MGordy Clement Cardiac Imaging Nurse Navigator [facility] Direct Office Dial: [phone]  For scheduling needs, including cancellations and rescheduling, please call [place] [phone]   ZIO XT- Long Term Monitor Instructions  Your physician has requested you wear a ZIO patch monitor for 14 days.  This is a single patch monitor. [employer] supplies one patch monitor per enrollment. Additional stickers are not available. Please do not apply patch if you will be having a Nuclear Stress Test,  Echocardiogram, Cardiac CT, MRI, or Chest Xray during the period you would be wearing the  monitor. The patch cannot be worn during these tests. You cannot remove and re-apply the  ZIO  XT patch monitor.  Your ZIO patch monitor will be mailed 3 day USPS to your address on file. It may take 3-5 days  to receive your monitor after you have been enrolled.  Once you have received your monitor, please review the enclosed instructions. Your monitor  has already been registered assigning a specific monitor serial # to you.  Billing and Patient Assistance Program Information  We have supplied [employer] with any of your insurance information on file for billing purposes. [employer] offers a  sliding scale Patient Assistance Program for patients that do not have  insurance, or whose insurance does not completely cover the cost of the ZIO monitor.  You must apply for the Patient Assistance Program to qualify for this discounted rate.  To apply, please call [employer] at [phone], select option 4, select option 2, ask to apply for  Patient Assistance Program. Theodore Demark will ask your household income, and how many people  are in your household. They will quote your out-of-pocket cost based on that information.  [employer] will also be able to set up a 34-month interest-free payment plan if needed.  Applying the monitor   Shave hair from upper left chest.  Hold abrader disc by orange tab. Rub abrader in 40 strokes over the upper left chest as  indicated in your monitor instructions.  Clean area with 4 enclosed alcohol pads. Let dry.  Apply patch as indicated in monitor instructions. Patch will be placed under collarbone on left  side of chest with arrow pointing upward.  Rub patch adhesive wings for 2 minutes. Remove white label marked "1". Remove the white  label marked "2". Rub patch adhesive wings for 2 additional minutes.  While looking in a mirror, press and release button in center of patch. A small green light will  flash 3-4 times. This will be your only indicator that the monitor has been turned on.  Do not shower for the first 24 hours. You may shower after the first 24 hours.  Press the button if you feel a symptom. You will hear a small click. Record Date, Time and  Symptom in the Patient Logbook.  When you are ready to remove the patch, follow instructions on the last 2 pages of Patient  Logbook. Stick patch monitor onto the last page of Patient Logbook.  Place Patient Logbook in the blue and white box. Use locking tab on box and tape box closed  securely. The blue and white box has prepaid postage on it. Please place it in the mailbox as  soon as possible. Your physician  should have your test results approximately 7 days after the  monitor has been [facility]  Call [place] [phone] you have questions regarding  your ZIO XT patch monitor. Call them immediately if you see an orange light blinking on your  monitor.  If your monitor falls off in less than 4 days, contact our Monitor department at [phone]  If your monitor becomes loose or falls off after 4 days call [employer] at [phone]  suggestions on securing your monitor    Signed, PMertie Moores MD  06/23/2022 5:40 PM    [place]

## 2022-06-23 ENCOUNTER — Ambulatory Visit (INDEPENDENT_AMBULATORY_CARE_PROVIDER_SITE_OTHER): Payer: BC Managed Care – PPO

## 2022-06-23 ENCOUNTER — Ambulatory Visit: Payer: BC Managed Care – PPO | Attending: Cardiovascular Disease | Admitting: Cardiovascular Disease

## 2022-06-23 ENCOUNTER — Encounter: Payer: Self-pay | Admitting: Cardiovascular Disease

## 2022-06-23 VITALS — BP 118/70 | HR 72 | Ht 62.0 in | Wt 254.8 lb

## 2022-06-23 DIAGNOSIS — Z01810 Encounter for preprocedural cardiovascular examination: Secondary | ICD-10-CM | POA: Diagnosis not present

## 2022-06-23 DIAGNOSIS — R0789 Other chest pain: Secondary | ICD-10-CM | POA: Diagnosis not present

## 2022-06-23 DIAGNOSIS — R002 Palpitations: Secondary | ICD-10-CM | POA: Diagnosis not present

## 2022-06-23 DIAGNOSIS — R0600 Dyspnea, unspecified: Secondary | ICD-10-CM

## 2022-06-23 NOTE — Progress Notes (Unsigned)
Enrolled patient for a 14 day Zio XT  monitor to be mailed to patients home  °

## 2022-06-23 NOTE — Patient Instructions (Addendum)
Medication Instructions:  Your physician recommends that you continue on your current medications as directed. Please refer to the Current Medication list given to you today.  *If you need a refill on your cardiac medications before your next appointment, please call your pharmacy*   Lab Work: BMET today If you have labs (blood work) drawn today and your tests are completely normal, you will receive your results only by: [place] (if you have MyChart) OR A paper copy in the mail If you have any lab test that is abnormal or we need to change your treatment, we will call you to review the results.   Testing/Procedures: ECHO Your physician has requested that you have an echocardiogram. Echocardiography is a painless test that uses sound waves to create images of your heart. It provides your doctor with information about the size and shape of your heart and how well your heart's chambers and valves are working. This procedure takes approximately one hour. There are no restrictions for this procedure. Please do NOT wear cologne, perfume, aftershave, or lotions (deodorant is allowed). Please arrive 15 minutes prior to your appointment time.  14-day zio monitor Your physician has recommended that you wear an event monitor. Event monitors are medical devices that record the heart's electrical activity. Doctors most often [place] these monitors to diagnose arrhythmias. Arrhythmias are problems with the speed or rhythm of the heartbeat. The monitor is a small, portable device. You can wear one while you do your normal daily activities. This is usually used to diagnose what is causing palpitations/syncope (passing out).  Coronary CTA Your physician has requested that you have cardiac CT. Cardiac computed tomography (CT) is a painless test that uses an x-ray machine to take clear, detailed pictures of your heart. For further information please visit [email or web address]. Please follow instruction sheet  as given.  Follow-Up: At [facility], you and your health needs are our priority.  As part of our continuing mission to provide you with exceptional heart care, we have created designated Provider Care Teams.  These Care Teams include your primary Cardiologist (physician) and Advanced Practice Providers (APPs -  Physician Assistants and Nurse Practitioners) who all work together to provide you with the care you need, when you need it.  Your next appointment:   3 month(s)  Provider:   Mertie Moores, MD   Other Instructions   Your cardiac CT will be scheduled at:   [facility] [address] [place], [place] [address] [phone]  please arrive at the [facility] and [facility] Entrance (Entrance C2) of [facility] 30 minutes prior to test start time. You can use the FREE valet parking offered at entrance C (encouraged to control the heart rate for the test)  Proceed to the [facility] Radiology Department (first floor) to check-in and test prep.  All radiology patients and guests should use entrance C2 at [facility], accessed from [facility], even though the hospital's physical address listed is [address].     Please follow these instructions carefully (unless otherwise directed):  On the Night Before the Test: Be sure to Drink plenty of water. Do not consume any caffeinated/decaffeinated beverages or chocolate 12 hours prior to your test. Do not take any antihistamines 12 hours prior to your test.  On the Day of the Test: Drink plenty of water until 1 hour prior to the test. Do not eat any food 1 hour prior to test. You may take your  regular medications prior to the test.  Take metoprolol (two tablets-8m) two hours prior to test. If you take Hydrochlorothiazide please HOLD on the morning of the test. FEMALES- please wear underwire-free bra if available, avoid dresses & tight clothing    After the  Test: Drink plenty of water. After receiving IV contrast, you may experience a mild flushed feeling. This is normal. On occasion, you may experience a mild rash up to 24 hours after the test. This is not dangerous. If this occurs, you can take Benadryl 25 mg and increase your fluid intake. If you experience trouble breathing, this can be serious. If it is severe call 911 IMMEDIATELY. If it is mild, please call our office. If you take any of these medications: Glipizide/Metformin, Avandament, Glucavance, please do not take 48 hours after completing test unless otherwise instructed.  We will call to schedule your test 2-4 weeks out understanding that some insurance companies will need an authorization prior to the service being performed.   For non-scheduling related questions, please contact the cardiac imaging nurse navigator should you have any questions/concerns: SMarchia Bond Cardiac Imaging Nurse Navigator MGordy Clement Cardiac Imaging Nurse Navigator [facility] Direct Office Dial: [phone]  For scheduling needs, including cancellations and rescheduling, please call [place] [phone]   ZIO XT- Long Term Monitor Instructions  Your physician has requested you wear a ZIO patch monitor for 14 days.  This is a single patch monitor. [employer] supplies one patch monitor per enrollment. Additional stickers are not available. Please do not apply patch if you will be having a Nuclear Stress Test,  Echocardiogram, Cardiac CT, MRI, or Chest Xray during the period you would be wearing the  monitor. The patch cannot be worn during these tests. You cannot remove and re-apply the  ZIO XT patch monitor.  Your ZIO patch monitor will be mailed 3 day USPS to your address on file. It may take 3-5 days  to receive your monitor after you have been enrolled.  Once you have received your monitor, please review the enclosed instructions. Your monitor  has already been  registered assigning a specific monitor serial # to you.  Billing and Patient Assistance Program Information  We have supplied [employer] with any of your insurance information on file for billing purposes. [employer] offers a sliding scale Patient Assistance Program for patients that do not have  insurance, or whose insurance does not completely cover the cost of the ZIO monitor.  You must apply for the Patient Assistance Program to qualify for this discounted rate.  To apply, please call [employer] at [phone] select option 4, select option 2, ask to apply for  Patient Assistance Program. ITheodore Demarkwill ask your household income, and how many people  are in your household. They will quote your out-of-pocket cost based on that information.  [employer] will also be able to set up a 160-monthinterest-free payment plan if needed.  Applying the monitor   Shave hair from upper left chest.  Hold abrader disc by orange tab. Rub abrader in 40 strokes over the upper left chest as  indicated in your monitor instructions.  Clean area with 4 enclosed alcohol pads. Let dry.  Apply patch as indicated in monitor instructions. Patch will be placed under collarbone on left  side of chest with arrow pointing upward.  Rub patch adhesive wings for 2 minutes. Remove white label marked "1". Remove the white  label marked "2". Rub patch adhesive wings for 2  additional minutes.  While looking in a mirror, press and release button in center of patch. A small green light will  flash 3-4 times. This will be your only indicator that the monitor has been turned on.  Do not shower for the first 24 hours. You may shower after the first 24 hours.  Press the button if you feel a symptom. You will hear a small click. Record Date, Time and  Symptom in the Patient Logbook.  When you are ready to remove the patch, follow instructions on the last 2 pages of Patient  Logbook. Stick patch monitor onto the last page of Patient  Logbook.  Place Patient Logbook in the blue and white box. Use locking tab on box and tape box closed  securely. The blue and white box has prepaid postage on it. Please place it in the mailbox as  soon as possible. Your physician should have your test results approximately 7 days after the  monitor has been mailed back to [facility].  Call [place] at [phone] if you have questions regarding  your ZIO XT patch monitor. Call them immediately if you see an orange light blinking on your  monitor.  If your monitor falls off in less than 4 days, contact our Monitor department at [phone].  If your monitor becomes loose or falls off after 4 days call [employer] at [phone] for  suggestions on securing your monitor

## 2022-06-24 LAB — BASIC METABOLIC PANEL
BUN/Creatinine Ratio: 15 (ref 9–23)
BUN: 12 mg/dL (ref 6–24)
CO2: 23 mmol/L (ref 20–29)
Calcium: 9.3 mg/dL (ref 8.7–10.2)
Chloride: 102 mmol/L (ref 96–106)
Creatinine, Ser: 0.78 mg/dL (ref 0.57–1.00)
Glucose: 84 mg/dL (ref 70–99)
Potassium: 3.6 mmol/L (ref 3.5–5.2)
Sodium: 141 mmol/L (ref 134–144)
eGFR: 93 mL/min/{1.73_m2} (ref >59)

## 2022-06-27 DIAGNOSIS — R197 Diarrhea, unspecified: Secondary | ICD-10-CM | POA: Diagnosis not present

## 2022-06-27 DIAGNOSIS — R079 Chest pain, unspecified: Secondary | ICD-10-CM | POA: Diagnosis not present

## 2022-06-27 DIAGNOSIS — K449 Diaphragmatic hernia without obstruction or gangrene: Secondary | ICD-10-CM | POA: Diagnosis not present

## 2022-06-27 DIAGNOSIS — R1011 Right upper quadrant pain: Secondary | ICD-10-CM | POA: Diagnosis not present

## 2022-06-27 DIAGNOSIS — R112 Nausea with vomiting, unspecified: Secondary | ICD-10-CM | POA: Diagnosis not present

## 2022-06-27 DIAGNOSIS — I10 Essential (primary) hypertension: Secondary | ICD-10-CM | POA: Diagnosis not present

## 2022-06-27 DIAGNOSIS — K760 Fatty (change of) liver, not elsewhere classified: Secondary | ICD-10-CM | POA: Diagnosis not present

## 2022-06-27 DIAGNOSIS — R109 Unspecified abdominal pain: Secondary | ICD-10-CM | POA: Diagnosis not present

## 2022-07-03 ENCOUNTER — Telehealth: Payer: Self-pay | Admitting: *Deleted

## 2022-07-03 NOTE — Telephone Encounter (Signed)
Patient has not received her ZIO XT monitor. UPS tracking says they cannot deliver to [address]. [employer] has recently temporarily change to [employer] instead of USPS. Patient gave me a land business address to have monitor delivered to. [address], [place], [place]  [address].  Linton Ham at [facility] notified and he will have a second device shipped out.

## 2022-07-04 DIAGNOSIS — K219 Gastro-esophageal reflux disease without esophagitis: Secondary | ICD-10-CM | POA: Diagnosis not present

## 2022-07-04 DIAGNOSIS — K5900 Constipation, unspecified: Secondary | ICD-10-CM | POA: Diagnosis not present

## 2022-07-04 DIAGNOSIS — Z8719 Personal history of other diseases of the digestive system: Secondary | ICD-10-CM | POA: Diagnosis not present

## 2022-07-04 DIAGNOSIS — R1013 Epigastric pain: Secondary | ICD-10-CM | POA: Diagnosis not present

## 2022-07-08 ENCOUNTER — Other Ambulatory Visit: Payer: Self-pay | Admitting: Family Medicine

## 2022-07-08 DIAGNOSIS — R0602 Shortness of breath: Secondary | ICD-10-CM

## 2022-07-08 DIAGNOSIS — R002 Palpitations: Secondary | ICD-10-CM

## 2022-07-08 DIAGNOSIS — F410 Panic disorder [episodic paroxysmal anxiety] without agoraphobia: Secondary | ICD-10-CM

## 2022-07-08 DIAGNOSIS — R0789 Other chest pain: Secondary | ICD-10-CM

## 2022-07-13 ENCOUNTER — Encounter: Payer: Self-pay | Admitting: Radiology

## 2022-07-19 ENCOUNTER — Ambulatory Visit (HOSPITAL_COMMUNITY): Payer: BC Managed Care – PPO | Attending: Cardiology

## 2022-07-19 DIAGNOSIS — R0600 Dyspnea, unspecified: Secondary | ICD-10-CM | POA: Diagnosis not present

## 2022-07-19 DIAGNOSIS — R002 Palpitations: Secondary | ICD-10-CM | POA: Diagnosis not present

## 2022-07-19 DIAGNOSIS — R0789 Other chest pain: Secondary | ICD-10-CM | POA: Insufficient documentation

## 2022-07-19 LAB — ECHOCARDIOGRAM COMPLETE
Area-P 1/2: 4.1 cm2
S' Lateral: 3.2 cm

## 2022-07-21 ENCOUNTER — Encounter: Payer: Self-pay | Admitting: Family Medicine

## 2022-07-21 ENCOUNTER — Ambulatory Visit (INDEPENDENT_AMBULATORY_CARE_PROVIDER_SITE_OTHER): Payer: BC Managed Care – PPO | Admitting: Family Medicine

## 2022-07-21 VITALS — BP 104/71 | HR 70 | Temp 98.7°F | Ht 62.0 in | Wt 249.0 lb

## 2022-07-21 DIAGNOSIS — R7303 Prediabetes: Secondary | ICD-10-CM | POA: Diagnosis not present

## 2022-07-21 DIAGNOSIS — Z124 Encounter for screening for malignant neoplasm of cervix: Secondary | ICD-10-CM | POA: Diagnosis not present

## 2022-07-21 DIAGNOSIS — Z0001 Encounter for general adult medical examination with abnormal findings: Secondary | ICD-10-CM

## 2022-07-21 DIAGNOSIS — M25562 Pain in left knee: Secondary | ICD-10-CM

## 2022-07-21 DIAGNOSIS — Z6841 Body Mass Index (BMI) 40.0 and over, adult: Secondary | ICD-10-CM

## 2022-07-21 DIAGNOSIS — D72829 Elevated white blood cell count, unspecified: Secondary | ICD-10-CM | POA: Diagnosis not present

## 2022-07-21 DIAGNOSIS — M47816 Spondylosis without myelopathy or radiculopathy, lumbar region: Secondary | ICD-10-CM

## 2022-07-21 DIAGNOSIS — K5909 Other constipation: Secondary | ICD-10-CM | POA: Diagnosis not present

## 2022-07-21 DIAGNOSIS — F411 Generalized anxiety disorder: Secondary | ICD-10-CM

## 2022-07-21 DIAGNOSIS — Z0000 Encounter for general adult medical examination without abnormal findings: Secondary | ICD-10-CM

## 2022-07-21 DIAGNOSIS — I471 Supraventricular tachycardia, unspecified: Secondary | ICD-10-CM

## 2022-07-21 DIAGNOSIS — E876 Hypokalemia: Secondary | ICD-10-CM

## 2022-07-21 DIAGNOSIS — G8929 Other chronic pain: Secondary | ICD-10-CM

## 2022-07-21 LAB — BAYER DCA HB A1C WAIVED: HB A1C (BAYER DCA - WAIVED): 5.7 % — ABNORMAL HIGH (ref 4.8–5.6)

## 2022-07-21 MED ORDER — WEGOVY 1 MG/0.5ML ~~LOC~~ SOAJ: 1.0000 mg | SUBCUTANEOUS | 0 refills | Status: DC

## 2022-07-21 MED ORDER — CITALOPRAM HYDROBROMIDE 20 MG PO TABS: 20.0000 mg | ORAL_TABLET | Freq: Every day | ORAL | 3 refills | Status: DC

## 2022-07-21 MED ORDER — WEGOVY 0.25 MG/0.5ML ~~LOC~~ SOAJ: 0.2500 mg | SUBCUTANEOUS | 0 refills | Status: DC

## 2022-07-21 MED ORDER — HYDROCHLOROTHIAZIDE 25 MG PO TABS: 25.0000 mg | ORAL_TABLET | Freq: Every day | ORAL | 3 refills | Status: DC

## 2022-07-21 MED ORDER — POTASSIUM CHLORIDE ER 20 MEQ PO TBCR: 1.0000 | EXTENDED_RELEASE_TABLET | Freq: Two times a day (BID) | ORAL | 3 refills | Status: DC

## 2022-07-21 MED ORDER — GABAPENTIN 100 MG PO CAPS: 100.0000 mg | ORAL_CAPSULE | Freq: Two times a day (BID) | ORAL | 3 refills | Status: DC

## 2022-07-21 MED ORDER — WEGOVY 2.4 MG/0.75ML ~~LOC~~ SOAJ: 2.4000 mg | SUBCUTANEOUS | 3 refills | Status: DC

## 2022-07-21 MED ORDER — METOPROLOL SUCCINATE ER 25 MG PO TB24: 25.0000 mg | ORAL_TABLET | Freq: Every day | ORAL | 3 refills | Status: DC

## 2022-07-21 MED ORDER — WEGOVY 1.7 MG/0.75ML ~~LOC~~ SOAJ: 1.7000 mg | SUBCUTANEOUS | 0 refills | Status: DC

## 2022-07-21 MED ORDER — WEGOVY 0.5 MG/0.5ML ~~LOC~~ SOAJ: 0.5000 mg | SUBCUTANEOUS | 0 refills | Status: DC

## 2022-07-21 NOTE — Patient Instructions (Signed)
Wegovy, Zepbound are the 2 shots available for weight loss. Message me if not covered.  Preventive Care 59-50 Years Old, Female Preventive care refers to lifestyle choices and visits with your health care provider that can promote health and wellness. Preventive care visits are also called wellness exams. What can I expect for my preventive care visit? Counseling Your health care provider may ask you questions about your: Medical history, including: Past medical problems. Family medical history. Pregnancy history. Current health, including: Menstrual cycle. Method of birth control. Emotional well-being. Home life and relationship well-being. Sexual activity and sexual health. Lifestyle, including: Alcohol, nicotine or tobacco, and drug use. Access to firearms. Diet, exercise, and sleep habits. Work and work Statistician. Sunscreen use. Safety issues such as seatbelt and bike helmet use. Physical exam Your health care provider will check your: Height and weight. These may be used to calculate your BMI (body mass index). BMI is a measurement that tells if you are at a healthy weight. Waist circumference. This measures the distance around your waistline. This measurement also tells if you are at a healthy weight and may help predict your risk of certain diseases, such as type 2 diabetes and high blood pressure. Heart rate and blood pressure. Body temperature. Skin for abnormal spots. What immunizations do I need?  Vaccines are usually given at various ages, according to a schedule. Your health care provider will recommend vaccines for you based on your age, medical history, and lifestyle or other factors, such as travel or where you work. What tests do I need? Screening Your health care provider may recommend screening tests for certain conditions. This may include: Lipid and cholesterol levels. Diabetes screening. This is done by checking your blood sugar (glucose) after you have  not eaten for a while (fasting). Pelvic exam and Pap test. Hepatitis B test. Hepatitis C test. HIV (human immunodeficiency virus) test. STI (sexually transmitted infection) testing, if you are at risk. Lung cancer screening. Colorectal cancer screening. Mammogram. Talk with your health care provider about when you should start having regular mammograms. This may depend on whether you have a family history of breast cancer. BRCA-related cancer screening. This may be done if you have a family history of breast, ovarian, tubal, or peritoneal cancers. Bone density scan. This is done to screen for osteoporosis. Talk with your health care provider about your test results, treatment options, and if necessary, the need for more tests. Follow these instructions at home: Eating and drinking  Eat a diet that includes fresh fruits and vegetables, whole grains, lean protein, and low-fat dairy products. Take vitamin and mineral supplements as recommended by your health care provider. Do not drink alcohol if: Your health care provider tells you not to drink. You are pregnant, may be pregnant, or are planning to become pregnant. If you drink alcohol: Limit how much you have to 0-1 drink a day. Know how much alcohol is in your drink. In the [place] one drink equals one 12 oz bottle of beer (355 mL), one 5 oz glass of wine (148 mL), or one 1 oz glass of hard liquor (44 mL). Lifestyle Brush your teeth every morning and night with fluoride toothpaste. Floss one time each day. Exercise for at least 30 minutes 5 or more days each week. Do not use any products that contain nicotine or tobacco. These products include cigarettes, chewing tobacco, and vaping devices, such as e-cigarettes. If you need help quitting, ask your health care provider. Do not use drugs. If  you are sexually active, practice safe sex. Use a condom or other form of protection to prevent STIs. If you do not wish to become pregnant, use a  form of birth control. If you plan to become pregnant, see your health care provider for a prepregnancy visit. Take aspirin only as told by your health care provider. Make sure that you understand how much to take and what form to take. Work with your health care provider to find out whether it is safe and beneficial for you to take aspirin daily. Find healthy ways to manage stress, such as: Meditation, yoga, or listening to music. Journaling. Talking to a trusted person. Spending time with friends and family. Minimize exposure to UV radiation to reduce your risk of skin cancer. Safety Always wear your seat belt while driving or riding in a vehicle. Do not drive: If you have been drinking alcohol. Do not ride with someone who has been drinking. When you are tired or distracted. While texting. If you have been using any mind-altering substances or drugs. Wear a helmet and other protective equipment during sports activities. If you have firearms in your house, make sure you follow all gun safety procedures. Seek help if you have been physically or sexually abused. What's next? Visit your health care provider once a year for an annual wellness visit. Ask your health care provider how often you should have your eyes and teeth checked. Stay up to date on all vaccines. This information is not intended to replace advice given to you by your health care provider. Make sure you discuss any questions you have with your health care provider. Document Revised: 10/27/2020 Document Reviewed: 10/27/2020 Elsevier Patient Education  [place].

## 2022-07-21 NOTE — Progress Notes (Signed)
Robby Rushell Bresnan is a 52 y.o. female presents to office today for annual physical exam examination.    Concerns today include: 1. Weight loss Patient has really tried to modify diet, increase physical exercise and even intermittently fast but weight loss has been quite difficult.  She is been having exacerbations and musculoskeletal pains as a result.  She also is currently being evaluated for arrhythmia due to ongoing heart palpitations.  Had ultrasound but has Zio patch in place now.  She was treated previously with Saxenda and had success with this before discontinuing.  It apparently started making her nauseated after several months of use.  She would like to try [facility] if possible.  Her coworker is prescribed this and their insurance does cover it.  No personal or family history of medullary thyroid cancer or multiple endocrine type II neoplasia's.  No history of pancreatitis.    Last mammogram: needs Last pap smear: needs Refills needed today: all Immunizations needed: Immunization History  Administered Date(s) Administered   Hepatitis B 05/05/2021   Influenza Inj Mdck Quad Pf 02/28/2021   Influenza Inj Mdck Quad With Preservative 05/19/2019   Influenza, Quadrivalent, Recombinant, Inj, Pf 04/03/2016, 02/28/2017   Influenza,inj,Quad PF,6+ Mos 03/01/2015, 04/03/2016, 02/28/2017, 03/13/2018, 03/09/2020   Influenza-Unspecified 03/01/2015   Moderna Sars-Covid-2 Vaccination 07/24/2019, 08/23/2019   Tdap 03/13/2018     Past Medical History:  Diagnosis Date   Chest pressure    Fatty liver    Fatty liver    GERD (gastroesophageal reflux disease)    Hiatal hernia    Hypertension    Hypokalemia    OA (osteoarthritis)    Palpitations    Plantar fasciitis    SOB (shortness of breath)    Vitamin D deficiency    Social History   Socioeconomic History   Marital status: Married    Spouse name: Not on file   Number of children: 3   Years of education: Not on file   Highest  education level: Not on file  Occupational History   Occupation: TEXTILE  Tobacco Use   Smoking status: Never   Smokeless tobacco: Never  Vaping Use   Vaping Use: Never used  Substance and Sexual Activity   Alcohol use: No    Alcohol/week: 0.0 standard drinks of alcohol   Drug use: No   Sexual activity: Not on file  Other Topics Concern   Not on file  Social History Narrative   Not on file   Social Determinants of Health   Financial Resource Strain: Not on file  Food Insecurity: Not on file  Transportation Needs: Not on file  Physical Activity: Not on file  Stress: Not on file  Social Connections: Not on file  Intimate Partner Violence: Not on file   Past Surgical History:  Procedure Laterality Date   [place] N/A 01/28/2016   Procedure: INSERTION OF MESH;  Surgeon: Ralene Ok, MD;  Location: [facility];  Service: General;  Laterality: N/A;   INSERTION OF MESH  09/06/2018   Procedure: INSERTION OF MESH;  Surgeon: Ralene Ok, MD;  Location: [facility];  Service: General;;   Right Knee Surgery     tubal ligation     Family History  Problem Relation Age of Onset   High blood pressure Mother    Diabetes Mother    Kidney cancer Mother    Sleep apnea Mother    Hyperlipidemia Sister  Heart disease Sister    Heart disease Brother    Hyperlipidemia Brother    Colon cancer Neg Hx    Rectal cancer Neg Hx    Stomach cancer Neg Hx     Current Outpatient Medications:    albuterol (VENTOLIN HFA) 108 (90 Base) MCG/ACT inhaler, INHALE 2 PUFFS BY MOUTH EVERY 4 HOURS AS NEEDED FOR WHEEZE OR FOR SHORTNESS OF BREATH, Disp: 6.7 each, Rfl: 0   cyclobenzaprine (FLEXERIL) 10 MG tablet, Take 1 tablet (10 mg total) by mouth 3 (three) times daily as needed for muscle spasms. (Patient taking differently: Take 10 mg by mouth as needed for muscle spasms.), Disp: 30 tablet, Rfl: 2   hydrOXYzine (ATARAX) 10 MG tablet, TAKE 1  TABLET BY MOUTH THREE TIMES A DAY AS NEEDED, Disp: 270 tablet, Rfl: 0   ibuprofen (ADVIL) 800 MG tablet, TAKE 1 TABLET BY MOUTH THREE TIMES A DAY, Disp: 270 tablet, Rfl: 1   loratadine (CLARITIN) 10 MG tablet, TAKE 1 TABLET BY MOUTH EVERY DAY, Disp: 90 tablet, Rfl: 1   Plecanatide (TRULANCE) 3 MG TABS, TAKE 3 MG BY MOUTH DAILY. FOR CONSTIPATION. BRINGING IN COUPON, Disp: 90 tablet, Rfl: 0   Semaglutide-Weight Management (WEGOVY) 0.25 MG/0.5ML SOAJ, Inject 0.25 mg into the skin every 7 (seven) days. Month #1, Disp: 2 mL, Rfl: 0   Semaglutide-Weight Management (WEGOVY) 0.5 MG/0.5ML SOAJ, Inject 0.5 mg into the skin every 7 (seven) days. Mnth #2, Disp: 2 mL, Rfl: 0   Semaglutide-Weight Management (WEGOVY) 1 MG/0.5ML SOAJ, Inject 1 mg into the skin every 7 (seven) days. Month #3, Disp: 2 mL, Rfl: 0   Semaglutide-Weight Management (WEGOVY) 1.7 MG/0.75ML SOAJ, Inject 1.7 mg into the skin every 7 (seven) days. Month #4, Disp: 3 mL, Rfl: 0   Semaglutide-Weight Management (WEGOVY) 2.4 MG/0.75ML SOAJ, Inject 2.4 mg into the skin every 7 (seven) days., Disp: 9 mL, Rfl: 3   citalopram (CELEXA) 20 MG tablet, Take 1 tablet (20 mg total) by mouth daily., Disp: 90 tablet, Rfl: 3   gabapentin (NEURONTIN) 100 MG capsule, Take 1 capsule (100 mg total) by mouth 2 (two) times daily., Disp: 180 capsule, Rfl: 3   hydrochlorothiazide (HYDRODIURIL) 25 MG tablet, Take 1 tablet (25 mg total) by mouth daily with breakfast., Disp: 90 tablet, Rfl: 3   metoprolol succinate (TOPROL-XL) 25 MG 24 hr tablet, Take 1 tablet (25 mg total) by mouth daily., Disp: 90 tablet, Rfl: 3   Potassium Chloride ER 20 MEQ TBCR, Take 1 tablet (20 mEq total) by mouth in the morning and at bedtime. For low potassium, Disp: 180 tablet, Rfl: 3   pramipexole (MIRAPEX) 0.25 MG tablet, Take 0.75 mg by mouth 2 (two) times daily. Supper and bedtime, Disp: , Rfl:   No Known Allergies   ROS: Review of Systems Pertinent items noted in HPI and remainder of  comprehensive ROS otherwise negative.    Physical exam BP 104/71   Pulse 70   Temp 98.7 F (37.1 C)   Ht '5\' 2"'$  (1.575 m)   Wt 249 lb (112.9 kg)   LMP 07/14/2022   SpO2 95%   BMI 45.54 kg/m  General appearance: alert, cooperative, appears stated age, and no distress Head: Normocephalic, without obvious abnormality, atraumatic Eyes: negative findings: lids and lashes normal, conjunctivae and sclerae normal, corneas clear, and pupils equal, round, reactive to light and accomodation Ears: normal TM's and external ear canals both ears Nose: Nares normal. Septum midline. Mucosa normal. No drainage or sinus tenderness.  Throat: lips, mucosa, and tongue normal; teeth and gums normal Neck: no adenopathy, no carotid bruit, supple, symmetrical, trachea midline, and thyroid not enlarged, symmetric, no tenderness/mass/nodules Back: symmetric, no curvature. ROM normal. No CVA tenderness. Lungs: clear to auscultation bilaterally Heart: regular rate and rhythm, S1, S2 normal, no murmur, click, rub or gallop Abdomen: soft, non-tender; bowel sounds normal; no masses,  no organomegaly Extremities: extremities normal, atraumatic, no cyanosis or edema Pulses: 2+ and symmetric Skin: Skin color, texture, turgor normal. No rashes or lesions Lymph nodes: Cervical, supraclavicular, and axillary nodes normal. Neurologic: Grossly normal Psych: Mood stable, speech normal, affect appropriate.  Very pleasant, interactive     07/21/2022   10:27 AM 06/16/2022    9:26 AM 07/18/2021    9:11 AM  Depression screen PHQ 2/9  Decreased Interest 0 0 0  Down, Depressed, Hopeless 0 0 0  PHQ - 2 Score 0 0 0  Altered sleeping 0 0 0  Tired, decreased energy 0 0 0  Change in appetite 0 0 0  Feeling bad or failure about yourself  0 0 0  Trouble concentrating 0 0 0  Moving slowly or fidgety/restless 0 0 0  Suicidal thoughts 0 0 0  PHQ-9 Score 0 0 0  Difficult doing work/chores Not difficult at all Not difficult at all Not  difficult at all      07/21/2022   10:27 AM 06/16/2022    9:26 AM 07/18/2021    9:11 AM 07/01/2021   11:27 AM  GAD 7 : Generalized Anxiety Score  Nervous, Anxious, on Edge 0 0 0 0  Control/stop worrying 0 1 0 0  Worry too much - different things 0 1 0 0  Trouble relaxing 0 1 0 0  Restless 0 0 0 0  Easily annoyed or irritable 0 0 0 0  Afraid - awful might happen 0 1 0 0  Total GAD 7 Score 0 4 0 0  Anxiety Difficulty Not difficult at all Not difficult at all Not difficult at all Not difficult at all   Assessment/ Plan: Darlene [name] here for annual physical exam.   Annual physical exam  Screening for malignant neoplasm of cervix  Morbid obesity ([place]) - Plan: CMP14+EGFR, Lipid Panel, Bayer DCA Hb A1c Waived, VITAMIN D 25 Hydroxy (Vit-D Deficiency, Fractures), Semaglutide-Weight Management (WEGOVY) 0.25 MG/0.5ML SOAJ, Semaglutide-Weight Management (WEGOVY) 0.5 MG/0.5ML SOAJ, Semaglutide-Weight Management (WEGOVY) 1 MG/0.5ML SOAJ, Semaglutide-Weight Management (WEGOVY) 1.7 MG/0.75ML SOAJ, Semaglutide-Weight Management (WEGOVY) 2.4 MG/0.75ML SOAJ  Pre-diabetes - Plan: CMP14+EGFR, Bayer DCA Hb A1c Waived, Semaglutide-Weight Management (WEGOVY) 0.25 MG/0.5ML SOAJ, Semaglutide-Weight Management (WEGOVY) 0.5 MG/0.5ML SOAJ, Semaglutide-Weight Management (WEGOVY) 1 MG/0.5ML SOAJ, Semaglutide-Weight Management (WEGOVY) 1.7 MG/0.75ML SOAJ, Semaglutide-Weight Management (WEGOVY) 2.4 MG/0.75ML SOAJ  Chronic constipation - Plan: CMP14+EGFR, TSH  Leukocytosis, unspecified type - Plan: CBC with Differential  Chronic pain of left knee - Plan: Semaglutide-Weight Management (WEGOVY) 0.25 MG/0.5ML SOAJ, Semaglutide-Weight Management (WEGOVY) 0.5 MG/0.5ML SOAJ, Semaglutide-Weight Management (WEGOVY) 1 MG/0.5ML SOAJ, Semaglutide-Weight Management (WEGOVY) 1.7 MG/0.75ML SOAJ, Semaglutide-Weight Management (WEGOVY) 2.4 MG/0.75ML SOAJ  GAD (generalized anxiety disorder) - Plan: citalopram (CELEXA) 20 MG  tablet  Spondylosis of lumbar region without myelopathy or radiculopathy - Plan: gabapentin (NEURONTIN) 100 MG capsule  Paroxysmal supraventricular tachycardia - Plan: metoprolol succinate (TOPROL-XL) 25 MG 24 hr tablet  Hypokalemia - Plan: Potassium Chloride ER 20 MEQ TBCR  Patient's BMI is >30 mg/m2.  Patient's current BMI is Body mass index is 45.54 kg/m.Marland Kitchen  Patient is currently enrolled in a healthy eating  plan along with encouraged exercise.  Patient has contraindications to phentermine, Contrave & Qsymia (contains phentermine).  She is not a good candidate for phentermine containing products or any products that would prolong QTc due to arrhythmia.  Patient does not have a personal or family history of medullary thyroid carcinoma (MTC) or Multiple Endocrine Neoplasia syndrome type 2 (MEN 2).  Start [employer].  Discussed potential side effects.  Will follow-up in 3 to 4 months for weight check, sooner if concerns arise  Nonfasting labs collected today.  Will perform Pap smear at next visit as she was not prepared to have this done.   Counseled on healthy lifestyle choices, including diet (rich in fruits, vegetables and lean meats and low in salt and simple carbohydrates) and exercise (at least 30 minutes of moderate physical activity daily).     Cecilie Heidel M. Lajuana Ripple, DO

## 2022-07-22 ENCOUNTER — Other Ambulatory Visit: Payer: Self-pay | Admitting: Family Medicine

## 2022-07-22 DIAGNOSIS — R748 Abnormal levels of other serum enzymes: Secondary | ICD-10-CM

## 2022-07-22 DIAGNOSIS — E876 Hypokalemia: Secondary | ICD-10-CM

## 2022-07-22 LAB — LIPID PANEL
Chol/HDL Ratio: 3.7 ratio (ref 0.0–4.4)
Cholesterol, Total: 143 mg/dL (ref 100–199)
HDL: 39 mg/dL — ABNORMAL LOW (ref >39)
LDL Chol Calc (NIH): 79 mg/dL (ref 0–99)
Triglycerides: 143 mg/dL (ref 0–149)
VLDL Cholesterol Cal: 25 mg/dL (ref 5–40)

## 2022-07-22 LAB — CMP14+EGFR
ALT: 94 IU/L — ABNORMAL HIGH (ref 0–32)
AST: 101 IU/L — ABNORMAL HIGH (ref 0–40)
Albumin/Globulin Ratio: 1.5 (ref 1.2–2.2)
Albumin: 3.7 g/dL — ABNORMAL LOW (ref 3.9–4.9)
Alkaline Phosphatase: 221 IU/L — ABNORMAL HIGH (ref 44–121)
BUN/Creatinine Ratio: 17 (ref 9–23)
BUN: 13 mg/dL (ref 6–24)
Bilirubin Total: 0.2 mg/dL (ref 0.0–1.2)
CO2: 22 mmol/L (ref 20–29)
Calcium: 8.7 mg/dL (ref 8.7–10.2)
Chloride: 103 mmol/L (ref 96–106)
Creatinine, Ser: 0.78 mg/dL (ref 0.57–1.00)
Globulin, Total: 2.5 g/dL (ref 1.5–4.5)
Glucose: 131 mg/dL — ABNORMAL HIGH (ref 70–99)
Potassium: 3.2 mmol/L — ABNORMAL LOW (ref 3.5–5.2)
Sodium: 140 mmol/L (ref 134–144)
Total Protein: 6.2 g/dL (ref 6.0–8.5)
eGFR: 93 mL/min/{1.73_m2} (ref >59)

## 2022-07-22 LAB — CBC WITH DIFFERENTIAL/PLATELET
Basophils Absolute: 0 10*3/uL (ref 0.0–0.2)
Basos: 0 %
EOS (ABSOLUTE): 0.4 10*3/uL (ref 0.0–0.4)
Eos: 5 %
Hematocrit: 39.4 % (ref 34.0–46.6)
Hemoglobin: 13 g/dL (ref 11.1–15.9)
Immature Grans (Abs): 0 10*3/uL (ref 0.0–0.1)
Immature Granulocytes: 0 %
Lymphocytes Absolute: 1.8 10*3/uL (ref 0.7–3.1)
Lymphs: 25 %
MCH: 29.8 pg (ref 26.6–33.0)
MCHC: 33 g/dL (ref 31.5–35.7)
MCV: 90 fL (ref 79–97)
Monocytes Absolute: 0.3 10*3/uL (ref 0.1–0.9)
Monocytes: 4 %
Neutrophils Absolute: 4.8 10*3/uL (ref 1.4–7.0)
Neutrophils: 66 %
Platelets: 246 10*3/uL (ref 150–450)
RBC: 4.36 x10E6/uL (ref 3.77–5.28)
RDW: 11.4 % — ABNORMAL LOW (ref 11.7–15.4)
WBC: 7.3 10*3/uL (ref 3.4–10.8)

## 2022-07-22 LAB — VITAMIN D 25 HYDROXY (VIT D DEFICIENCY, FRACTURES): Vit D, 25-Hydroxy: 31.8 ng/mL (ref 30.0–100.0)

## 2022-07-22 LAB — TSH: TSH: 0.555 u[IU]/mL (ref 0.450–4.500)

## 2022-07-24 ENCOUNTER — Telehealth: Payer: Self-pay

## 2022-07-24 NOTE — Telephone Encounter (Signed)
Darlene [name] (Key: [id number]) Rx #[id number] 0.'25MG'$ /0.5ML auto-injectors Form [employer] [place] [employer] Electronic Request Form Created 3 days ago Sent to Plan 4 minutes ago Plan Response 3 minutes ago Submit Clinical Questions less than a minute ago Determination Wait for Determination Please wait for [place] 2017 to return a determination.

## 2022-07-25 ENCOUNTER — Encounter (INDEPENDENT_AMBULATORY_CARE_PROVIDER_SITE_OTHER): Payer: BC Managed Care – PPO | Admitting: Family Medicine

## 2022-07-25 DIAGNOSIS — R140 Abdominal distension (gaseous): Secondary | ICD-10-CM

## 2022-07-25 DIAGNOSIS — R197 Diarrhea, unspecified: Secondary | ICD-10-CM

## 2022-07-25 NOTE — Telephone Encounter (Signed)

## 2022-07-26 ENCOUNTER — Telehealth: Payer: Self-pay | Admitting: Family Medicine

## 2022-07-26 NOTE — Telephone Encounter (Signed)
Aigner Felling (Key: [id number])  form thumbnail This request has received an Unfavorable outcome.  An eAppeal may be available. View the bottom of the request to see if an eAppeal is available along with any additional information provided by [employer] [place].

## 2022-07-31 NOTE — Telephone Encounter (Signed)
Patient Advocate Encounter  Prior Authorization for [employer] 0.25MG /0.5ML auto-injectors has been approved.    Authorization Expiration Date:  11/27/22

## 2022-08-01 ENCOUNTER — Telehealth (HOSPITAL_COMMUNITY): Payer: Self-pay | Admitting: Emergency Medicine

## 2022-08-01 NOTE — Telephone Encounter (Signed)
Attempted to call patient regarding upcoming cardiac CT appointment. °Left message on voicemail with name and callback number °Darlene Mccleery RN Navigator Cardiac Imaging ° Heart and V[facility] Services °[phone] Office °[phone] Cell ° °

## 2022-08-03 ENCOUNTER — Ambulatory Visit (HOSPITAL_COMMUNITY)
Admission: RE | Admit: 2022-08-03 | Discharge: 2022-08-03 | Disposition: A | Discharge status: Home / Self Care | Payer: BC Managed Care – PPO | Source: Ambulatory Visit | Attending: Cardiovascular Disease | Admitting: Cardiovascular Disease

## 2022-08-03 DIAGNOSIS — R002 Palpitations: Secondary | ICD-10-CM

## 2022-08-03 DIAGNOSIS — R0600 Dyspnea, unspecified: Secondary | ICD-10-CM

## 2022-08-03 DIAGNOSIS — R072 Precordial pain: Secondary | ICD-10-CM

## 2022-08-03 DIAGNOSIS — R0789 Other chest pain: Secondary | ICD-10-CM

## 2022-08-03 MED ORDER — NITROGLYCERIN 0.4 MG SL SUBL
0.8000 mg | SUBLINGUAL_TABLET | Freq: Once | SUBLINGUAL | Status: AC
  Administered 2022-08-03: 0.8 mg via SUBLINGUAL

## 2022-08-03 MED ORDER — METOPROLOL TARTRATE 5 MG/5ML IV SOLN
INTRAVENOUS | Status: AC
  Filled 2022-08-03: qty 10

## 2022-08-03 MED ORDER — METOPROLOL TARTRATE 5 MG/5ML IV SOLN
5.0000 mg | INTRAVENOUS | Status: DC | PRN
  Administered 2022-08-03: 5 mg via INTRAVENOUS

## 2022-08-03 MED ORDER — IOHEXOL 350 MG/ML SOLN
95.0000 mL | Freq: Once | INTRAVENOUS | Status: AC | PRN
  Administered 2022-08-03: 95 mL via INTRAVENOUS

## 2022-08-03 MED ORDER — NITROGLYCERIN 0.4 MG SL SUBL
SUBLINGUAL_TABLET | SUBLINGUAL | Status: AC
  Filled 2022-08-03: qty 2

## 2022-08-04 ENCOUNTER — Ambulatory Visit (HOSPITAL_COMMUNITY)
Admission: RE | Admit: 2022-08-04 | Discharge: 2022-08-04 | Disposition: A | Discharge status: Home / Self Care | Payer: BC Managed Care – PPO | Source: Ambulatory Visit | Attending: Family Medicine | Admitting: Family Medicine

## 2022-08-04 ENCOUNTER — Telehealth: Payer: Self-pay | Admitting: Cardiovascular Disease

## 2022-08-04 ENCOUNTER — Other Ambulatory Visit: Payer: Self-pay | Admitting: Family Medicine

## 2022-08-04 DIAGNOSIS — R140 Abdominal distension (gaseous): Secondary | ICD-10-CM

## 2022-08-04 DIAGNOSIS — R748 Abnormal levels of other serum enzymes: Secondary | ICD-10-CM

## 2022-08-04 DIAGNOSIS — R197 Diarrhea, unspecified: Secondary | ICD-10-CM

## 2022-08-04 DIAGNOSIS — E782 Mixed hyperlipidemia: Secondary | ICD-10-CM

## 2022-08-04 DIAGNOSIS — K760 Fatty (change of) liver, not elsewhere classified: Secondary | ICD-10-CM | POA: Diagnosis not present

## 2022-08-04 DIAGNOSIS — Z79899 Other long term (current) drug therapy: Secondary | ICD-10-CM

## 2022-08-04 DIAGNOSIS — K8020 Calculus of gallbladder without cholecystitis without obstruction: Secondary | ICD-10-CM

## 2022-08-04 MED ORDER — ROSUVASTATIN CALCIUM 5 MG PO TABS: 5.0000 mg | ORAL_TABLET | Freq: Every day | ORAL | 3 refills | Status: DC

## 2022-08-04 NOTE — Telephone Encounter (Signed)
Called and spoke with patient who agrees to plan. Medication sent to pharmacy and labs scheduled for 11/07/22.

## 2022-08-04 NOTE — Telephone Encounter (Signed)
-----   Message from Thayer Headings, MD sent at 08/03/2022  4:49 PM EDT ----- Coronary calcium score of 3.36. This was 56 percentile for age-, sex, and race-matched controls No evidence of obstructive CAD  She does have coronary calcifications I would like for her LDL to be < 70 Current LDL is 79  Start rosuvastatin 5 mg a day  Check lipids, BMP  ALT in 3 months

## 2022-08-07 ENCOUNTER — Other Ambulatory Visit: Payer: Self-pay | Admitting: Family Medicine

## 2022-08-07 DIAGNOSIS — K8020 Calculus of gallbladder without cholecystitis without obstruction: Secondary | ICD-10-CM

## 2022-08-07 DIAGNOSIS — R748 Abnormal levels of other serum enzymes: Secondary | ICD-10-CM

## 2022-08-07 DIAGNOSIS — K838 Other specified diseases of biliary tract: Secondary | ICD-10-CM

## 2022-08-07 NOTE — Progress Notes (Signed)
Hey Dr. Lajuana Ripple, I think she might need a Stat MRCP. Can you order that for her and if she does have choledocholithiasis; she will need GI first for ERCP. Definitely with that CBD dilation worried for choledocholithiasis.

## 2022-08-07 NOTE — Progress Notes (Unsigned)
General surgery kindly request MRCP prior to consultation given dilation of duct.  If choledocholithiasis is observed she will need to see a stat GI for ERCP prior to eval by General surgery   Orders Placed This Encounter  Procedures   MR ABDOMEN MRCP W WO CONTAST    Standing Status:   Future    Standing Expiration Date:   08/07/2023    Order Specific Question:   If indicated for the ordered procedure, I authorize the administration of contrast media per Radiology protocol    Answer:   Yes    Order Specific Question:   What is the patient's sedation requirement?    Answer:   No Sedation    Order Specific Question:   Does the patient have a pacemaker or implanted devices?    Answer:   No    Order Specific Question:   Preferred imaging location?    Answer:   Internal

## 2022-08-08 ENCOUNTER — Other Ambulatory Visit: Payer: Self-pay

## 2022-08-08 ENCOUNTER — Encounter: Payer: Self-pay | Admitting: Family Medicine

## 2022-08-08 ENCOUNTER — Ambulatory Visit: Payer: BC Managed Care – PPO | Admitting: General Surgery

## 2022-08-08 ENCOUNTER — Encounter: Payer: Self-pay | Admitting: General Surgery

## 2022-08-08 VITALS — BP 111/77 | HR 74 | Temp 98.5°F | Resp 20 | Ht 62.0 in | Wt 241.0 lb

## 2022-08-08 DIAGNOSIS — K8020 Calculus of gallbladder without cholecystitis without obstruction: Secondary | ICD-10-CM | POA: Diagnosis not present

## 2022-08-08 NOTE — Progress Notes (Signed)
Darlene [name]; [id number]; [birth year]   HPI Patient is a 52 year old white female who was referred to my care by Dr. Lajuana Ripple for evaluation and treatment of cholelithiasis.  Patient has had upper abdominal pain, bloating, and nausea with food over the past few months.  At the present time, she is eating crackers and clear liquids.  She does not have specific point tenderness in the right upper quadrant with radiation to the right flank.  She is status post laparoscopic fundoplication x 2 for GERD.  She denies any significant dysphagia.  She can belch.  She does not have significant reflux disease.  She has had some heart palpitations which has been evaluated by cardiology and felt to be benign.  She denies any fever, chills, or jaundice.  An ultrasound of the gallbladder which does reveal cholelithiasis with a slightly dilated common bile duct.  She does have a fatty liver.  Her LFTs were also noted to be slightly elevated.  She is scheduled to get an MRCP to rule out choledocholithiasis.  Her symptoms are daily in nature. Past Medical History:  Diagnosis Date   Chest pressure    Fatty liver    Fatty liver    GERD (gastroesophageal reflux disease)    Hiatal hernia    Hypertension    Hypokalemia    OA (osteoarthritis)    Palpitations    Plantar fasciitis    SOB (shortness of breath)    Vitamin D deficiency     Past Surgical History:  Procedure Laterality Date   CESAREAN SECTION  1993   FOOT SURGERY Right 1997   INSERTION OF MESH N/A 01/28/2016   Procedure: INSERTION OF MESH;  Surgeon: Ralene Ok, MD;  Location: [facility];  Service: General;  Laterality: N/A;   INSERTION OF MESH  09/06/2018   Procedure: INSERTION OF MESH;  Surgeon: Ralene Ok, MD;  Location: [facility];  Service: General;;   Right Knee Surgery     tubal ligation      Family History  Problem Relation Age of Onset   High blood pressure Mother    Diabetes Mother    Kidney cancer Mother    Sleep apnea Mother     Hyperlipidemia Sister    Heart disease Sister    Heart disease Brother    Hyperlipidemia Brother    Colon cancer Neg Hx    Rectal cancer Neg Hx    Stomach cancer Neg Hx     Current Outpatient Medications on File Prior to Visit  Medication Sig Dispense Refill   albuterol (VENTOLIN HFA) 108 (90 Base) MCG/ACT inhaler INHALE 2 PUFFS BY MOUTH EVERY 4 HOURS AS NEEDED FOR WHEEZE OR FOR SHORTNESS OF BREATH 6.7 each 0   citalopram (CELEXA) 20 MG tablet Take 1 tablet (20 mg total) by mouth daily. 90 tablet 3   cyclobenzaprine (FLEXERIL) 10 MG tablet Take 1 tablet (10 mg total) by mouth 3 (three) times daily as needed for muscle spasms. (Patient taking differently: Take 10 mg by mouth as needed for muscle spasms.) 30 tablet 2   gabapentin (NEURONTIN) 100 MG capsule Take 1 capsule (100 mg total) by mouth 2 (two) times daily. 180 capsule 3   hydrochlorothiazide (HYDRODIURIL) 25 MG tablet Take 1 tablet (25 mg total) by mouth daily with breakfast. 90 tablet 3   hydrOXYzine (ATARAX) 10 MG tablet TAKE 1 TABLET BY MOUTH THREE TIMES A DAY AS NEEDED 270 tablet 0   ibuprofen (ADVIL) 800 MG tablet TAKE 1 TABLET BY  MOUTH THREE TIMES A DAY 270 tablet 1   loratadine (CLARITIN) 10 MG tablet TAKE 1 TABLET BY MOUTH EVERY DAY 90 tablet 1   metoprolol succinate (TOPROL-XL) 25 MG 24 hr tablet Take 1 tablet (25 mg total) by mouth daily. 90 tablet 3   Plecanatide (TRULANCE) 3 MG TABS TAKE 3 MG BY MOUTH DAILY. FOR CONSTIPATION. BRINGING IN COUPON 90 tablet 0   Potassium Chloride ER 20 MEQ TBCR Take 1 tablet (20 mEq total) by mouth in the morning and at bedtime. For low potassium 180 tablet 3   RABEprazole (ACIPHEX) 20 MG tablet Take by mouth.     rosuvastatin (CRESTOR) 5 MG tablet Take 1 tablet (5 mg total) by mouth daily. 90 tablet 3   Semaglutide-Weight Management (WEGOVY) 0.25 MG/0.5ML SOAJ Inject 0.25 mg into the skin every 7 (seven) days. Month #1 2 mL 0   Semaglutide-Weight Management (WEGOVY) 0.5 MG/0.5ML SOAJ Inject  0.5 mg into the skin every 7 (seven) days. Mnth #2 2 mL 0   Semaglutide-Weight Management (WEGOVY) 1 MG/0.5ML SOAJ Inject 1 mg into the skin every 7 (seven) days. Month #3 2 mL 0   Semaglutide-Weight Management (WEGOVY) 1.7 MG/0.75ML SOAJ Inject 1.7 mg into the skin every 7 (seven) days. Month #4 3 mL 0   Semaglutide-Weight Management (WEGOVY) 2.4 MG/0.75ML SOAJ Inject 2.4 mg into the skin every 7 (seven) days. 9 mL 3   pramipexole (MIRAPEX) 0.25 MG tablet Take 0.75 mg by mouth 2 (two) times daily. Supper and bedtime     No current facility-administered medications on file prior to visit.    No Known Allergies  Social History   Substance and Sexual Activity  Alcohol Use No   Alcohol/week: 0.0 standard drinks of alcohol    Social History   Tobacco Use  Smoking Status Never  Smokeless Tobacco Never    Review of Systems  Constitutional: Negative.   HENT: Negative.    Eyes: Negative.   Respiratory:  Positive for shortness of breath.   Cardiovascular: Negative.   Gastrointestinal:  Positive for abdominal pain and nausea.  Genitourinary: Negative.   Musculoskeletal: Negative.   Skin: Negative.   Neurological: Negative.   Endo/Heme/Allergies: Negative.   Psychiatric/Behavioral: Negative.      Objective   Vitals:   08/08/22 1036  BP: 111/77  Pulse: 74  Resp: 20  Temp: 98.5 F (36.9 C)  SpO2: 97%    Physical Exam Vitals reviewed.  Constitutional:      Appearance: Normal appearance. She is obese. She is not ill-appearing.  HENT:     Head: Normocephalic and atraumatic.  Eyes:     General: No scleral icterus. Cardiovascular:     Rate and Rhythm: Normal rate and regular rhythm.     Heart sounds: Normal heart sounds. No murmur heard.    No friction rub. No gallop.  Pulmonary:     Effort: Pulmonary effort is normal. No respiratory distress.     Breath sounds: Normal breath sounds. No stridor. No wheezing, rhonchi or rales.  Abdominal:     General: Bowel sounds are  normal. There is no distension.     Palpations: Abdomen is soft. There is no mass.     Tenderness: There is no abdominal tenderness. There is no guarding.     Hernia: No hernia is present.     Comments: She does have discomfort in the epigastric region to palpation.  No rigidity is noted.  I could not elicit specific right upper quadrant abdominal  pain to deep palpation.  Skin:    General: Skin is warm and dry.  Neurological:     Mental Status: She is alert and oriented to person, place, and time.    Records reviewed Assessment  Cholelithiasis, probable biliary colic.  She does have elevated LFTs which can be consistent with cholelithiasis as well as a fatty liver.  Slightly dilated common bile duct by ultrasound. Plan  Patient is being scheduled for an MRCP to further evaluate the common bile duct.  Further management is pending those results.  I will see the patient back when that test is done.

## 2022-08-11 ENCOUNTER — Ambulatory Visit (HOSPITAL_BASED_OUTPATIENT_CLINIC_OR_DEPARTMENT_OTHER)
Admission: RE | Admit: 2022-08-11 | Discharge: 2022-08-11 | Disposition: A | Discharge status: Home / Self Care | Payer: BC Managed Care – PPO | Source: Ambulatory Visit | Attending: Family Medicine | Admitting: Family Medicine

## 2022-08-11 DIAGNOSIS — R748 Abnormal levels of other serum enzymes: Secondary | ICD-10-CM

## 2022-08-11 DIAGNOSIS — K449 Diaphragmatic hernia without obstruction or gangrene: Secondary | ICD-10-CM | POA: Diagnosis not present

## 2022-08-11 DIAGNOSIS — K8020 Calculus of gallbladder without cholecystitis without obstruction: Secondary | ICD-10-CM | POA: Diagnosis not present

## 2022-08-11 DIAGNOSIS — K760 Fatty (change of) liver, not elsewhere classified: Secondary | ICD-10-CM | POA: Diagnosis not present

## 2022-08-11 DIAGNOSIS — K838 Other specified diseases of biliary tract: Secondary | ICD-10-CM

## 2022-08-11 MED ORDER — GADOBUTROL 1 MMOL/ML IV SOLN
9.8000 mL | Freq: Once | INTRAVENOUS | Status: AC | PRN
  Administered 2022-08-11: 9.8 mL via INTRAVENOUS
  Filled 2022-08-11: qty 10

## 2022-08-14 ENCOUNTER — Encounter: Payer: Self-pay | Admitting: General Surgery

## 2022-08-14 ENCOUNTER — Ambulatory Visit: Payer: BC Managed Care – PPO | Admitting: General Surgery

## 2022-08-14 VITALS — BP 136/86 | HR 78 | Temp 98.2°F | Resp 16 | Ht 62.0 in | Wt 245.0 lb

## 2022-08-14 DIAGNOSIS — K8020 Calculus of gallbladder without cholecystitis without obstruction: Secondary | ICD-10-CM

## 2022-08-14 NOTE — Progress Notes (Signed)
Subjective:     Darlene [name]  Patient here for follow-up of MRCP.  There was no evidence of choledocholithiasis.  She does have steatosis of the liver.  Patient has been avoiding trigger foods. Objective:    BP 136/86   Pulse 78   Temp 98.2 F (36.8 C) (Oral)   Resp 16   Ht 5\' 2"  (1.575 m)   Wt 245 lb (111.1 kg)   LMP 07/14/2022   SpO2 96%   BMI 44.81 kg/m   General:  alert, cooperative, and no distress       Assessment:    Cholelithiasis, biliary colic, fatty liver    Plan:   Patient is scheduled for robotic assisted laparoscopic cholecystectomy on 08/28/2022.  The risks and benefits of the procedure including bleeding, infection, hepatobiliary injury, and the possibility of an open procedure were fully explained to the patient, who gave informed consent.  She does realize that she may have some recurrence of symptoms due to her fatty liver.

## 2022-08-14 NOTE — H&P (Signed)
Darlene [name]; [id number]; [birth year]   HPI Patient is a 52 year old white female who was referred to my care by Dr. Lajuana Ripple for evaluation and treatment of cholelithiasis.  Patient has had upper abdominal pain, bloating, and nausea with food over the past few months.  At the present time, she is eating crackers and clear liquids.  She does not have specific point tenderness in the right upper quadrant with radiation to the right flank.  She is status post laparoscopic fundoplication x 2 for GERD.  She denies any significant dysphagia.  She can belch.  She does not have significant reflux disease.  She has had some heart palpitations which has been evaluated by cardiology and felt to be benign.  She denies any fever, chills, or jaundice.  An ultrasound of the gallbladder which does reveal cholelithiasis with a slightly dilated common bile duct.  She does have a fatty liver.  Her LFTs were also noted to be slightly elevated.  She is scheduled to get an MRCP to rule out choledocholithiasis.  Her symptoms are daily in nature. Past Medical History:  Diagnosis Date   Chest pressure    Fatty liver    Fatty liver    GERD (gastroesophageal reflux disease)    Hiatal hernia    Hypertension    Hypokalemia    OA (osteoarthritis)    Palpitations    Plantar fasciitis    SOB (shortness of breath)    Vitamin D deficiency     Past Surgical History:  Procedure Laterality Date   CESAREAN SECTION  1993   FOOT SURGERY Right 1997   INSERTION OF MESH N/A 01/28/2016   Procedure: INSERTION OF MESH;  Surgeon: Ralene Ok, MD;  Location: [facility];  Service: General;  Laterality: N/A;   INSERTION OF MESH  09/06/2018   Procedure: INSERTION OF MESH;  Surgeon: Ralene Ok, MD;  Location: [facility];  Service: General;;   Right Knee Surgery     tubal ligation      Family History  Problem Relation Age of Onset   High blood pressure Mother    Diabetes Mother    Kidney cancer Mother    Sleep apnea Mother     Hyperlipidemia Sister    Heart disease Sister    Heart disease Brother    Hyperlipidemia Brother    Colon cancer Neg Hx    Rectal cancer Neg Hx    Stomach cancer Neg Hx     Current Outpatient Medications on File Prior to Visit  Medication Sig Dispense Refill   albuterol (VENTOLIN HFA) 108 (90 Base) MCG/ACT inhaler INHALE 2 PUFFS BY MOUTH EVERY 4 HOURS AS NEEDED FOR WHEEZE OR FOR SHORTNESS OF BREATH 6.7 each 0   citalopram (CELEXA) 20 MG tablet Take 1 tablet (20 mg total) by mouth daily. 90 tablet 3   cyclobenzaprine (FLEXERIL) 10 MG tablet Take 1 tablet (10 mg total) by mouth 3 (three) times daily as needed for muscle spasms. (Patient taking differently: Take 10 mg by mouth as needed for muscle spasms.) 30 tablet 2   gabapentin (NEURONTIN) 100 MG capsule Take 1 capsule (100 mg total) by mouth 2 (two) times daily. 180 capsule 3   hydrochlorothiazide (HYDRODIURIL) 25 MG tablet Take 1 tablet (25 mg total) by mouth daily with breakfast. 90 tablet 3   hydrOXYzine (ATARAX) 10 MG tablet TAKE 1 TABLET BY MOUTH THREE TIMES A DAY AS NEEDED 270 tablet 0   ibuprofen (ADVIL) 800 MG tablet TAKE 1 TABLET BY  MOUTH THREE TIMES A DAY 270 tablet 1   loratadine (CLARITIN) 10 MG tablet TAKE 1 TABLET BY MOUTH EVERY DAY 90 tablet 1   metoprolol succinate (TOPROL-XL) 25 MG 24 hr tablet Take 1 tablet (25 mg total) by mouth daily. 90 tablet 3   Plecanatide (TRULANCE) 3 MG TABS TAKE 3 MG BY MOUTH DAILY. FOR CONSTIPATION. BRINGING IN COUPON 90 tablet 0   Potassium Chloride ER 20 MEQ TBCR Take 1 tablet (20 mEq total) by mouth in the morning and at bedtime. For low potassium 180 tablet 3   RABEprazole (ACIPHEX) 20 MG tablet Take by mouth.     rosuvastatin (CRESTOR) 5 MG tablet Take 1 tablet (5 mg total) by mouth daily. 90 tablet 3   Semaglutide-Weight Management (WEGOVY) 0.25 MG/0.5ML SOAJ Inject 0.25 mg into the skin every 7 (seven) days. Month #1 2 mL 0   Semaglutide-Weight Management (WEGOVY) 0.5 MG/0.5ML SOAJ Inject  0.5 mg into the skin every 7 (seven) days. Mnth #2 2 mL 0   Semaglutide-Weight Management (WEGOVY) 1 MG/0.5ML SOAJ Inject 1 mg into the skin every 7 (seven) days. Month #3 2 mL 0   Semaglutide-Weight Management (WEGOVY) 1.7 MG/0.75ML SOAJ Inject 1.7 mg into the skin every 7 (seven) days. Month #4 3 mL 0   Semaglutide-Weight Management (WEGOVY) 2.4 MG/0.75ML SOAJ Inject 2.4 mg into the skin every 7 (seven) days. 9 mL 3   pramipexole (MIRAPEX) 0.25 MG tablet Take 0.75 mg by mouth 2 (two) times daily. Supper and bedtime     No current facility-administered medications on file prior to visit.    No Known Allergies  Social History   Substance and Sexual Activity  Alcohol Use No   Alcohol/week: 0.0 standard drinks of alcohol    Social History   Tobacco Use  Smoking Status Never  Smokeless Tobacco Never    Review of Systems  Constitutional: Negative.   HENT: Negative.    Eyes: Negative.   Respiratory:  Positive for shortness of breath.   Cardiovascular: Negative.   Gastrointestinal:  Positive for abdominal pain and nausea.  Genitourinary: Negative.   Musculoskeletal: Negative.   Skin: Negative.   Neurological: Negative.   Endo/Heme/Allergies: Negative.   Psychiatric/Behavioral: Negative.      Objective   Vitals:   08/08/22 1036  BP: 111/77  Pulse: 74  Resp: 20  Temp: 98.5 F (36.9 C)  SpO2: 97%    Physical Exam Vitals reviewed.  Constitutional:      Appearance: Normal appearance. She is obese. She is not ill-appearing.  HENT:     Head: Normocephalic and atraumatic.  Eyes:     General: No scleral icterus. Cardiovascular:     Rate and Rhythm: Normal rate and regular rhythm.     Heart sounds: Normal heart sounds. No murmur heard.    No friction rub. No gallop.  Pulmonary:     Effort: Pulmonary effort is normal. No respiratory distress.     Breath sounds: Normal breath sounds. No stridor. No wheezing, rhonchi or rales.  Abdominal:     General: Bowel sounds are  normal. There is no distension.     Palpations: Abdomen is soft. There is no mass.     Tenderness: There is no abdominal tenderness. There is no guarding.     Hernia: No hernia is present.     Comments: She does have discomfort in the epigastric region to palpation.  No rigidity is noted.  I could not elicit specific right upper quadrant abdominal  pain to deep palpation.  Skin:    General: Skin is warm and dry.  Neurological:     Mental Status: She is alert and oriented to person, place, and time.    Records reviewed Assessment  Cholelithiasis, probable biliary colic.  She does have elevated LFTs which can be consistent with cholelithiasis as well as a fatty liver.  Slightly dilated common bile duct by ultrasound. Plan  MRCP was negative for choledocholithiasis.  Patient is scheduled for robotic assisted laparoscopic cholecystectomy on 08/28/2022.  The risks and benefits of the procedure including bleeding, infection, hepatobiliary injury, and the possibility of an open procedure were fully explained to the patient, who gave informed consent.  Patient is not taking her Wegovy at the present time.  Patient has been on potassium supplements twice a day and I told her to increase it to 3 times a day.

## 2022-08-16 ENCOUNTER — Other Ambulatory Visit: Payer: Self-pay | Admitting: Family Medicine

## 2022-08-16 DIAGNOSIS — G8929 Other chronic pain: Secondary | ICD-10-CM

## 2022-08-16 DIAGNOSIS — R7303 Prediabetes: Secondary | ICD-10-CM

## 2022-08-16 DIAGNOSIS — E876 Hypokalemia: Secondary | ICD-10-CM

## 2022-08-16 DIAGNOSIS — B9689 Other specified bacterial agents as the cause of diseases classified elsewhere: Secondary | ICD-10-CM

## 2022-08-21 DIAGNOSIS — R002 Palpitations: Secondary | ICD-10-CM | POA: Diagnosis not present

## 2022-08-21 DIAGNOSIS — R0600 Dyspnea, unspecified: Secondary | ICD-10-CM | POA: Diagnosis not present

## 2022-08-22 ENCOUNTER — Other Ambulatory Visit: Payer: Self-pay

## 2022-08-23 NOTE — Patient Instructions (Addendum)
Darlene [name]  08/23/2022     @PREFPERIOPPHARMACY @   Your procedure is scheduled on  08/28/2022.   Report [facility] at  [phone]  A.M.   Call this number if you have problems the morning of surgery:  [phone]  If you experience any cold or flu symptoms such as cough, fever, chills, shortness of breath, etc. between now and your scheduled surgery, please notify us at the above number.   Remember:  Do not eat or drink after midnight.        Use your inhaler before you come and bring your rescue inhaler with you.       Your last dose of wegovy should have been on 08/20/2022 or before.     Take these medicines the morning of surgery with A SIP OF WATER         celexa, flexeril(if needed), gabapentin, hydroxyzine, loratadine, aciphex.     Do not wear jewelry, make-up or nail polish.  Do not wear lotions, powders, or perfumes, or deodorant.  Do not shave 48 hours prior to surgery.  Men may shave face and neck.  Do not bring valuables to the hospital.  [facility] is not responsible for any belongings or valuables.  Contacts, dentures or bridgework may not be worn into surgery.  Leave your suitcase in the car.  After surgery it may be brought to your room.  For patients admitted to the hospital, discharge time will be determined by your treatment team.  Patients discharged the day of surgery will not be allowed to drive home and must have someone with them for 24 hours.    Special instructions:   DO NOT smoke tobacco or vape for 24 hours before your procedure.  Please read over the following fact sheets that you were given. Coughing and Deep Breathing, Surgical Site Infection Prevention, Anesthesia Post-op Instructions, and Care and Recovery After Surgery       Minimally Invasive Cholecystectomy, Care After The following information offers guidance on how to care for yourself after your procedure. Your health care provider may also give you more specific  instructions. If you have problems or questions, contact your health care provider. What can I expect after the procedure? After the procedure, it is common to have: Pain at your incision sites. You will be given medicines to control this pain. Mild nausea or vomiting. Bloating and possible shoulder pain from the gas that was used during the procedure. Follow these instructions at home: Medicines Take over-the-counter and prescription medicines only as told by your health care provider. If you were prescribed an antibiotic medicine, take it as told by your health care provider. Do not stop using the antibiotic even if you start to feel better. Ask your health care provider if the medicine prescribed to you: Requires you to avoid driving or using machinery. Can cause constipation. You may need to take these actions to prevent or treat constipation: Drink enough fluid to keep your urine pale yellow. Take over-the-counter or prescription medicines. Eat foods that are high in fiber, such as beans, whole grains, and fresh fruits and vegetables. Limit foods that are high in fat and processed sugars, such as fried or sweet foods. Incision care  Follow instructions from your health care provider about how to take care of your incisions. Make sure you: Wash your hands with soap and water for at least 20 seconds before and after you change your bandage (dressing).  If soap and water are not available, use hand sanitizer. Change your dressing as told by your health care provider. Leave stitches (sutures), skin glue, or adhesive strips in place. These skin closures may need to be in place for 2 weeks or longer. If adhesive strip edges start to loosen and curl up, you may trim the loose edges. Do not remove adhesive strips completely unless your health care provider tells you to do that. Do not take baths, swim, or use a hot tub until your health care provider approves. Ask your health care provider if you  may take showers. You may only be allowed to take sponge baths. Check your incision area every day for signs of infection. Check for: More redness, swelling, or pain. Fluid or blood. Warmth. Pus or a bad smell. Activity Rest as told by your health care provider. Do not do activities that require a lot of effort. Avoid sitting for a long time without moving. Get up to take short walks every 1-2 hours. This is important to improve blood flow and breathing. Ask for help if you feel weak or unsteady. Do not lift anything that is heavier than 10 lb (4.5 kg), or the limit that you are told, until your health care provider says that it is safe. Do not play contact sports until your health care provider approves. Do not return to work or school until your health care provider approves. Return to your normal activities as told by your health care provider. Ask your health care provider what activities are safe for you. General instructions If you were given a sedative during the procedure, it can affect you for several hours. Do not drive or operate machinery until your health care provider says that it is safe. Keep all follow-up visits. This is important. Contact a health care provider if: You develop a rash. You have more redness, swelling, or pain around your incisions. You have fluid or blood coming from your incisions. Your incisions feel warm to the touch. You have pus or a bad smell coming from your incisions. You have a fever. One or more of your incisions breaks open. Get help right away if: You have trouble breathing. You have chest pain. You have more pain in your shoulders. You faint or feel dizzy when you stand. You have severe pain in your abdomen. You have nausea or vomiting that lasts for more than one day. You have leg pain that is new or unusual, or if it is localized to one specific spot. These symptoms may represent a serious problem that is an emergency. Do not wait to  see if the symptoms will go away. Get medical help right away. Call your local emergency services (911 in the [place] Do not drive yourself to the hospital. Summary After your procedure, it is common to have pain at the incision sites. You may also have nausea or bloating. Follow your health care provider's instructions about medicine, activity restrictions, and caring for your incision areas. Do not do activities that require a lot of effort. Contact a health care provider if you have a fever or other signs of infection, such as more redness, swelling, or pain around the incisions. Get help right away if you have chest pain, increasing pain in the shoulders, or trouble breathing. This information is not intended to replace advice given to you by your health care provider. Make sure you discuss any questions you have with your health care provider. Document Revised: 11/02/2020 Document Reviewed:  11/02/2020 Elsevier Patient Education  2023 [employer] General Anesthesia, Adult, Care After The following information offers guidance on how to care for yourself after your procedure. Your health care provider may also give you more specific instructions. If you have problems or questions, contact your health care provider. What can I expect after the procedure? After the procedure, it is common for people to: Have pain or discomfort at the IV site. Have nausea or vomiting. Have a sore throat or hoarseness. Have trouble concentrating. Feel cold or chills. Feel weak, sleepy, or tired (fatigue). Have soreness and body aches. These can affect parts of the body that were not involved in surgery. Follow these instructions at home: For the time period you were told by your health care provider:  Rest. Do not participate in activities where you could fall or become injured. Do not drive or use machinery. Do not drink alcohol. Do not take sleeping pills or medicines that cause drowsiness. Do not make  important decisions or sign legal documents. Do not take care of children on your own. General instructions Drink enough fluid to keep your urine pale yellow. If you have sleep apnea, surgery and certain medicines can increase your risk for breathing problems. Follow instructions from your health care provider about wearing your sleep device: Anytime you are sleeping, including during daytime naps. While taking prescription pain medicines, sleeping medicines, or medicines that make you drowsy. Return to your normal activities as told by your health care provider. Ask your health care provider what activities are safe for you. Take over-the-counter and prescription medicines only as told by your health care provider. Do not use any products that contain nicotine or tobacco. These products include cigarettes, chewing tobacco, and vaping devices, such as e-cigarettes. These can delay incision healing after surgery. If you need help quitting, ask your health care provider. Contact a health care provider if: You have nausea or vomiting that does not get better with medicine. You vomit every time you eat or drink. You have pain that does not get better with medicine. You cannot urinate or have bloody urine. You develop a skin rash. You have a fever. Get help right away if: You have trouble breathing. You have chest pain. You vomit blood. These symptoms may be an emergency. Get help right away. Call 911. Do not wait to see if the symptoms will go away. Do not drive yourself to the hospital. Summary After the procedure, it is common to have a sore throat, hoarseness, nausea, vomiting, or to feel weak, sleepy, or fatigue. For the time period you were told by your health care provider, do not drive or use machinery. Get help right away if you have difficulty breathing, have chest pain, or vomit blood. These symptoms may be an emergency. This information is not intended to replace advice given to  you by your health care provider. Make sure you discuss any questions you have with your health care provider. Document Revised: 07/29/2021 Document Reviewed: 07/29/2021 Elsevier Patient Education  2023 [employer] How to Use Chlorhexidine Before Surgery Chlorhexidine gluconate (CHG) is a germ-killing (antiseptic) solution that is used to clean the skin. It can get rid of the bacteria that normally live on the skin and can keep them away for about 24 hours. To clean your skin with CHG, you may be given: A CHG solution to use in the shower or as part of a sponge bath. A prepackaged cloth that contains CHG. Cleaning your skin with CHG  may help lower the risk for infection: While you are staying in the intensive care unit of the hospital. If you have a vascular access, such as a central line, to provide short-term or long-term access to your veins. If you have a catheter to drain urine from your bladder. If you are on a ventilator. A ventilator is a machine that helps you breathe by moving air in and out of your lungs. After surgery. What are the risks? Risks of using CHG include: A skin reaction. Hearing loss, if CHG gets in your ears and you have a perforated eardrum. Eye injury, if CHG gets in your eyes and is not rinsed out. The CHG product catching fire. Make sure that you avoid smoking and flames after applying CHG to your skin. Do not use CHG: If you have a chlorhexidine allergy or have previously reacted to chlorhexidine. On babies younger than 63 months of age. How to use CHG solution Use CHG only as told by your health care provider, and follow the instructions on the label. Use the full amount of CHG as directed. Usually, this is one bottle. During a shower Follow these steps when using CHG solution during a shower (unless your health care provider gives you different instructions): Start the shower. Use your normal soap and shampoo to wash your face and hair. Turn off the  shower or move out of the shower stream. Pour the CHG onto a clean washcloth. Do not use any type of brush or rough-edged sponge. Starting at your neck, lather your body down to your toes. Make sure you follow these instructions: If you will be having surgery, pay special attention to the part of your body where you will be having surgery. Scrub this area for at least 1 minute. Do not use CHG on your head or face. If the solution gets into your ears or eyes, rinse them well with water. Avoid your genital area. Avoid any areas of skin that have broken skin, cuts, or scrapes. Scrub your back and under your arms. Make sure to wash skin folds. Let the lather sit on your skin for 1-2 minutes or as long as told by your health care provider. Thoroughly rinse your entire body in the shower. Make sure that all body creases and crevices are rinsed well. Dry off with a clean towel. Do not put any substances on your body afterward--such as powder, lotion, or perfume--unless you are told to do so by your health care provider. Only use lotions that are recommended by the manufacturer. Put on clean clothes or pajamas. If it is the night before your surgery, sleep in clean sheets.  During a sponge bath Follow these steps when using CHG solution during a sponge bath (unless your health care provider gives you different instructions): Use your normal soap and shampoo to wash your face and hair. Pour the CHG onto a clean washcloth. Starting at your neck, lather your body down to your toes. Make sure you follow these instructions: If you will be having surgery, pay special attention to the part of your body where you will be having surgery. Scrub this area for at least 1 minute. Do not use CHG on your head or face. If the solution gets into your ears or eyes, rinse them well with water. Avoid your genital area. Avoid any areas of skin that have broken skin, cuts, or scrapes. Scrub your back and under your arms.  Make sure to wash skin folds. Let the lather  sit on your skin for 1-2 minutes or as long as told by your health care provider. Using a different clean, wet washcloth, thoroughly rinse your entire body. Make sure that all body creases and crevices are rinsed well. Dry off with a clean towel. Do not put any substances on your body afterward--such as powder, lotion, or perfume--unless you are told to do so by your health care provider. Only use lotions that are recommended by the manufacturer. Put on clean clothes or pajamas. If it is the night before your surgery, sleep in clean sheets. How to use CHG prepackaged cloths Only use CHG cloths as told by your health care provider, and follow the instructions on the label. Use the CHG cloth on clean, dry skin. Do not use the CHG cloth on your head or face unless your health care provider tells you to. When washing with the CHG cloth: Avoid your genital area. Avoid any areas of skin that have broken skin, cuts, or scrapes. Before surgery Follow these steps when using a CHG cloth to clean before surgery (unless your health care provider gives you different instructions): Using the CHG cloth, vigorously scrub the part of your body where you will be having surgery. Scrub using a back-and-forth motion for 3 minutes. The area on your body should be completely wet with CHG when you are done scrubbing. Do not rinse. Discard the cloth and let the area air-dry. Do not put any substances on the area afterward, such as powder, lotion, or perfume. Put on clean clothes or pajamas. If it is the night before your surgery, sleep in clean sheets.  For general bathing Follow these steps when using CHG cloths for general bathing (unless your health care provider gives you different instructions). Use a separate CHG cloth for each area of your body. Make sure you wash between any folds of skin and between your fingers and toes. Wash your body in the following order,  switching to a new cloth after each step: The front of your neck, shoulders, and chest. Both of your arms, under your arms, and your hands. Your stomach and groin area, avoiding the genitals. Your right leg and foot. Your left leg and foot. The back of your neck, your back, and your buttocks. Do not rinse. Discard the cloth and let the area air-dry. Do not put any substances on your body afterward--such as powder, lotion, or perfume--unless you are told to do so by your health care provider. Only use lotions that are recommended by the manufacturer. Put on clean clothes or pajamas. Contact a health care provider if: Your skin gets irritated after scrubbing. You have questions about using your solution or cloth. You swallow any chlorhexidine. Call your local poison control center ([phone] in the [place] Get help right away if: Your eyes itch badly, or they become very red or swollen. Your skin itches badly and is red or swollen. Your hearing changes. You have trouble seeing. You have swelling or tingling in your mouth or throat. You have trouble breathing. These symptoms may represent a serious problem that is an emergency. Do not wait to see if the symptoms will go away. Get medical help right away. Call your local emergency services (911 in the [place] Do not drive yourself to the hospital. Summary Chlorhexidine gluconate (CHG) is a germ-killing (antiseptic) solution that is used to clean the skin. Cleaning your skin with CHG may help to lower your risk for infection. You may be given CHG to use  for bathing. It may be in a bottle or in a prepackaged cloth to use on your skin. Carefully follow your health care provider's instructions and the instructions on the product label. Do not use CHG if you have a chlorhexidine allergy. Contact your health care provider if your skin gets irritated after scrubbing. This information is not intended to replace advice given to you by your health  care provider. Make sure you discuss any questions you have with your health care provider. Document Revised: 08/29/2021 Document Reviewed: 07/12/2020 Elsevier Patient Education  2023 [employer].

## 2022-08-24 ENCOUNTER — Encounter (HOSPITAL_COMMUNITY): Payer: Self-pay

## 2022-08-24 ENCOUNTER — Encounter (HOSPITAL_COMMUNITY)
Admission: RE | Admit: 2022-08-24 | Discharge: 2022-08-24 | Disposition: A | Discharge status: Home / Self Care | Payer: BC Managed Care – PPO | Source: Ambulatory Visit | Attending: General Surgery | Admitting: General Surgery

## 2022-08-24 ENCOUNTER — Telehealth (INDEPENDENT_AMBULATORY_CARE_PROVIDER_SITE_OTHER): Payer: BC Managed Care – PPO | Admitting: General Surgery

## 2022-08-24 ENCOUNTER — Other Ambulatory Visit: Payer: Self-pay

## 2022-08-24 VITALS — BP 114/73 | HR 86 | Temp 98.2°F | Resp 18 | Ht 62.0 in | Wt 245.0 lb

## 2022-08-24 DIAGNOSIS — E876 Hypokalemia: Secondary | ICD-10-CM | POA: Diagnosis not present

## 2022-08-24 DIAGNOSIS — Z01812 Encounter for preprocedural laboratory examination: Secondary | ICD-10-CM | POA: Insufficient documentation

## 2022-08-24 DIAGNOSIS — Z09 Encounter for follow-up examination after completed treatment for conditions other than malignant neoplasm: Secondary | ICD-10-CM

## 2022-08-24 DIAGNOSIS — Z01818 Encounter for other preprocedural examination: Secondary | ICD-10-CM

## 2022-08-24 LAB — COMPREHENSIVE METABOLIC PANEL
ALT: 24 U/L (ref 0–44)
AST: 18 U/L (ref 15–41)
Albumin: 3.4 g/dL — ABNORMAL LOW (ref 3.5–5.0)
Alkaline Phosphatase: 124 U/L (ref 38–126)
Anion gap: 8 (ref 5–15)
BUN: 16 mg/dL (ref 6–20)
CO2: 24 mmol/L (ref 22–32)
Calcium: 8.4 mg/dL — ABNORMAL LOW (ref 8.9–10.3)
Chloride: 103 mmol/L (ref 98–111)
Creatinine, Ser: 0.82 mg/dL (ref 0.44–1.00)
GFR, Estimated: >60 mL/min (ref >60)
Glucose, Bld: 113 mg/dL — ABNORMAL HIGH (ref 70–99)
Potassium: 2.9 mmol/L — ABNORMAL LOW (ref 3.5–5.1)
Sodium: 135 mmol/L (ref 135–145)
Total Bilirubin: 0.5 mg/dL (ref 0.3–1.2)
Total Protein: 6.6 g/dL (ref 6.5–8.1)

## 2022-08-24 LAB — POCT PREGNANCY, URINE: Preg Test, Ur: NEGATIVE

## 2022-08-24 LAB — CBC WITH DIFFERENTIAL/PLATELET
Abs Immature Granulocytes: 0.03 10*3/uL (ref 0.00–0.07)
Basophils Absolute: 0.1 10*3/uL (ref 0.0–0.1)
Basophils Relative: 1 %
Eosinophils Absolute: 0.4 10*3/uL (ref 0.0–0.5)
Eosinophils Relative: 5 %
HCT: 38.7 % (ref 36.0–46.0)
Hemoglobin: 13.1 g/dL (ref 12.0–15.0)
Immature Granulocytes: 0 %
Lymphocytes Relative: 28 %
Lymphs Abs: 2.1 10*3/uL (ref 0.7–4.0)
MCH: 30.2 pg (ref 26.0–34.0)
MCHC: 33.9 g/dL (ref 30.0–36.0)
MCV: 89.2 fL (ref 80.0–100.0)
Monocytes Absolute: 0.4 10*3/uL (ref 0.1–1.0)
Monocytes Relative: 6 %
Neutro Abs: 4.5 10*3/uL (ref 1.7–7.7)
Neutrophils Relative %: 60 %
Platelets: 227 10*3/uL (ref 150–400)
RBC: 4.34 MIL/uL (ref 3.87–5.11)
RDW: 12.3 % (ref 11.5–15.5)
WBC: 7.5 10*3/uL (ref 4.0–10.5)
nRBC: 0 % (ref 0.0–0.2)

## 2022-08-24 NOTE — Pre-Procedure Instructions (Signed)
Dr Lovell Sheehan called patient to increase her potassium and to have her hold her HCTZ until after her surgery.

## 2022-08-24 NOTE — Telephone Encounter (Signed)
Patient's preoperative potassium noted to be 2.9.  Patient has a chronic history of hypokalemia.  She was told to increase her potassium supplements of 20 mill equivalents to 5 times a day.  She should stop her hydrochlorothiazide.  She will have her potassium rechecked on the day of surgery on 08/28/2022.

## 2022-08-28 ENCOUNTER — Ambulatory Visit (HOSPITAL_COMMUNITY): Payer: BC Managed Care – PPO | Admitting: Anesthesiology

## 2022-08-28 ENCOUNTER — Ambulatory Visit (HOSPITAL_COMMUNITY)
Admission: RE | Admit: 2022-08-28 | Discharge: 2022-08-28 | Disposition: A | Discharge status: Home / Self Care | Payer: BC Managed Care – PPO | Source: Ambulatory Visit | Attending: General Surgery | Admitting: General Surgery

## 2022-08-28 ENCOUNTER — Other Ambulatory Visit: Payer: Self-pay

## 2022-08-28 ENCOUNTER — Encounter (HOSPITAL_COMMUNITY)
Admission: RE | Disposition: A | Discharge status: Home / Self Care | Payer: Self-pay | Source: Ambulatory Visit | Attending: General Surgery

## 2022-08-28 ENCOUNTER — Encounter (HOSPITAL_COMMUNITY): Payer: Self-pay | Admitting: General Surgery

## 2022-08-28 DIAGNOSIS — K8010 Calculus of gallbladder with chronic cholecystitis without obstruction: Secondary | ICD-10-CM | POA: Diagnosis not present

## 2022-08-28 DIAGNOSIS — E876 Hypokalemia: Secondary | ICD-10-CM

## 2022-08-28 DIAGNOSIS — F419 Anxiety disorder, unspecified: Secondary | ICD-10-CM | POA: Diagnosis not present

## 2022-08-28 DIAGNOSIS — K8020 Calculus of gallbladder without cholecystitis without obstruction: Secondary | ICD-10-CM

## 2022-08-28 DIAGNOSIS — Z79899 Other long term (current) drug therapy: Secondary | ICD-10-CM | POA: Diagnosis not present

## 2022-08-28 DIAGNOSIS — K219 Gastro-esophageal reflux disease without esophagitis: Secondary | ICD-10-CM | POA: Diagnosis not present

## 2022-08-28 DIAGNOSIS — K8012 Calculus of gallbladder with acute and chronic cholecystitis without obstruction: Secondary | ICD-10-CM | POA: Diagnosis not present

## 2022-08-28 DIAGNOSIS — K449 Diaphragmatic hernia without obstruction or gangrene: Secondary | ICD-10-CM | POA: Insufficient documentation

## 2022-08-28 DIAGNOSIS — Z6841 Body Mass Index (BMI) 40.0 and over, adult: Secondary | ICD-10-CM | POA: Insufficient documentation

## 2022-08-28 DIAGNOSIS — K8000 Calculus of gallbladder with acute cholecystitis without obstruction: Secondary | ICD-10-CM | POA: Diagnosis not present

## 2022-08-28 DIAGNOSIS — I10 Essential (primary) hypertension: Secondary | ICD-10-CM | POA: Diagnosis not present

## 2022-08-28 DIAGNOSIS — Z01818 Encounter for other preprocedural examination: Secondary | ICD-10-CM

## 2022-08-28 LAB — POCT I-STAT, CHEM 8
BUN: 23 mg/dL — ABNORMAL HIGH (ref 6–20)
Calcium, Ion: 1.18 mmol/L (ref 1.15–1.40)
Chloride: 106 mmol/L (ref 98–111)
Creatinine, Ser: 0.7 mg/dL (ref 0.44–1.00)
Glucose, Bld: 102 mg/dL — ABNORMAL HIGH (ref 70–99)
HCT: 40 % (ref 36.0–46.0)
Hemoglobin: 13.6 g/dL (ref 12.0–15.0)
Potassium: 4.2 mmol/L (ref 3.5–5.1)
Sodium: 140 mmol/L (ref 135–145)
TCO2: 24 mmol/L (ref 22–32)

## 2022-08-28 SURGERY — CHOLECYSTECTOMY, ROBOT-ASSISTED, LAPAROSCOPIC
Anesthesia: General | Site: Abdomen

## 2022-08-28 MED ORDER — PROPOFOL 10 MG/ML IV BOLUS
INTRAVENOUS | Status: AC
  Filled 2022-08-28: qty 20

## 2022-08-28 MED ORDER — CHLORHEXIDINE GLUCONATE 0.12 % MT SOLN
15.0000 mL | Freq: Once | OROMUCOSAL | Status: AC
  Administered 2022-08-28: 15 mL via OROMUCOSAL

## 2022-08-28 MED ORDER — ONDANSETRON HCL 4 MG/2ML IJ SOLN
INTRAMUSCULAR | Status: AC
  Filled 2022-08-28: qty 2

## 2022-08-28 MED ORDER — BUPIVACAINE HCL (PF) 0.5 % IJ SOLN
INTRAMUSCULAR | Status: DC | PRN
  Administered 2022-08-28: 20 mL

## 2022-08-28 MED ORDER — STERILE WATER FOR IRRIGATION IR SOLN
Status: DC | PRN
  Administered 2022-08-28: 500 mL

## 2022-08-28 MED ORDER — INDOCYANINE GREEN 25 MG IV SOLR
INTRAVENOUS | Status: AC
  Administered 2022-08-28: 7.5 mg via INTRAVENOUS
  Filled 2022-08-28: qty 10

## 2022-08-28 MED ORDER — ONDANSETRON HCL 4 MG PO TABS: 4.0000 mg | ORAL_TABLET | Freq: Three times a day (TID) | ORAL | 0 refills | Status: DC | PRN

## 2022-08-28 MED ORDER — SODIUM CHLORIDE 0.9 % IR SOLN
Status: DC | PRN
  Administered 2022-08-28: 3000 mL

## 2022-08-28 MED ORDER — ONDANSETRON HCL 4 MG/2ML IJ SOLN: 4.0000 mg | Freq: Once | INTRAMUSCULAR | Status: DC | PRN

## 2022-08-28 MED ORDER — FENTANYL CITRATE (PF) 250 MCG/5ML IJ SOLN
INTRAMUSCULAR | Status: DC | PRN
  Administered 2022-08-28 (×4): 50 ug via INTRAVENOUS

## 2022-08-28 MED ORDER — LIDOCAINE 2% (20 MG/ML) 5 ML SYRINGE
INTRAMUSCULAR | Status: DC | PRN
  Administered 2022-08-28: 100 mg via INTRAVENOUS

## 2022-08-28 MED ORDER — DEXAMETHASONE SODIUM PHOSPHATE 4 MG/ML IJ SOLN
INTRAMUSCULAR | Status: DC | PRN
  Administered 2022-08-28: 5 mg via INTRAVENOUS

## 2022-08-28 MED ORDER — MIDAZOLAM HCL 2 MG/2ML IJ SOLN
INTRAMUSCULAR | Status: AC
  Filled 2022-08-28: qty 2

## 2022-08-28 MED ORDER — ORAL CARE MOUTH RINSE: 15.0000 mL | Freq: Once | OROMUCOSAL | Status: AC

## 2022-08-28 MED ORDER — CEFAZOLIN SODIUM-DEXTROSE 2-4 GM/100ML-% IV SOLN
2.0000 g | INTRAVENOUS | Status: AC
  Administered 2022-08-28: 2 g via INTRAVENOUS
  Filled 2022-08-28: qty 100

## 2022-08-28 MED ORDER — ROCURONIUM BROMIDE 10 MG/ML (PF) SYRINGE
PREFILLED_SYRINGE | INTRAVENOUS | Status: DC | PRN
  Administered 2022-08-28: 10 mg via INTRAVENOUS
  Administered 2022-08-28: 50 mg via INTRAVENOUS

## 2022-08-28 MED ORDER — ROCURONIUM BROMIDE 10 MG/ML (PF) SYRINGE
PREFILLED_SYRINGE | INTRAVENOUS | Status: AC
  Filled 2022-08-28: qty 10

## 2022-08-28 MED ORDER — EPHEDRINE 5 MG/ML INJ
INTRAVENOUS | Status: AC
  Filled 2022-08-28: qty 5

## 2022-08-28 MED ORDER — BUPIVACAINE HCL (PF) 0.5 % IJ SOLN
INTRAMUSCULAR | Status: AC
  Filled 2022-08-28: qty 30

## 2022-08-28 MED ORDER — ONDANSETRON HCL 4 MG/2ML IJ SOLN
INTRAMUSCULAR | Status: DC | PRN
  Administered 2022-08-28: 4 mg via INTRAVENOUS

## 2022-08-28 MED ORDER — FENTANYL CITRATE (PF) 250 MCG/5ML IJ SOLN
INTRAMUSCULAR | Status: AC
  Filled 2022-08-28: qty 5

## 2022-08-28 MED ORDER — EPHEDRINE SULFATE-NACL 50-0.9 MG/10ML-% IV SOSY
PREFILLED_SYRINGE | INTRAVENOUS | Status: DC | PRN
  Administered 2022-08-28: 10 mg via INTRAVENOUS
  Administered 2022-08-28: 5 mg via INTRAVENOUS

## 2022-08-28 MED ORDER — SUGAMMADEX SODIUM 200 MG/2ML IV SOLN
INTRAVENOUS | Status: DC | PRN
  Administered 2022-08-28: 200 mg via INTRAVENOUS

## 2022-08-28 MED ORDER — PROPOFOL 10 MG/ML IV BOLUS
INTRAVENOUS | Status: DC | PRN
  Administered 2022-08-28: 200 mg via INTRAVENOUS
  Administered 2022-08-28: 50 mg via INTRAVENOUS

## 2022-08-28 MED ORDER — LACTATED RINGERS IV SOLN: INTRAVENOUS | Status: DC

## 2022-08-28 MED ORDER — MIDAZOLAM HCL 2 MG/2ML IJ SOLN
INTRAMUSCULAR | Status: DC | PRN
  Administered 2022-08-28: 2 mg via INTRAVENOUS

## 2022-08-28 MED ORDER — CHLORHEXIDINE GLUCONATE CLOTH 2 % EX PADS: 6.0000 | MEDICATED_PAD | Freq: Once | CUTANEOUS | Status: DC

## 2022-08-28 MED ORDER — KETOROLAC TROMETHAMINE 30 MG/ML IJ SOLN
INTRAMUSCULAR | Status: DC | PRN
  Administered 2022-08-28: 30 mg via INTRAVENOUS

## 2022-08-28 MED ORDER — OXYCODONE HCL 5 MG PO TABS: 5.0000 mg | ORAL_TABLET | ORAL | 0 refills | Status: DC | PRN

## 2022-08-28 MED ORDER — MEPERIDINE HCL 50 MG/ML IJ SOLN: 6.2500 mg | INTRAMUSCULAR | Status: DC | PRN

## 2022-08-28 MED ORDER — HYDROMORPHONE HCL 1 MG/ML IJ SOLN: 0.2500 mg | INTRAMUSCULAR | Status: DC | PRN

## 2022-08-28 MED ORDER — LIDOCAINE HCL (PF) 2 % IJ SOLN
INTRAMUSCULAR | Status: AC
  Filled 2022-08-28: qty 5

## 2022-08-28 MED ORDER — DEXMEDETOMIDINE HCL IN NACL 80 MCG/20ML IV SOLN
INTRAVENOUS | Status: DC | PRN
  Administered 2022-08-28: 12 ug via BUCCAL
  Administered 2022-08-28 (×2): 8 ug via BUCCAL

## 2022-08-28 MED ORDER — KETOROLAC TROMETHAMINE 30 MG/ML IJ SOLN
INTRAMUSCULAR | Status: AC
  Filled 2022-08-28: qty 1

## 2022-08-28 MED ORDER — INDOCYANINE GREEN 25 MG IV SOLR: 7.5000 mg | Freq: Once | INTRAVENOUS | Status: AC

## 2022-08-28 SURGICAL SUPPLY — 43 items
ADH SKN CLS APL DERMABOND .7 (GAUZE/BANDAGES/DRESSINGS) ×1
APL PRP STRL LF DISP 70% ISPRP (MISCELLANEOUS) ×1
CAUTERY HOOK MNPLR 1.6 DVNC XI (INSTRUMENTS) ×1 IMPLANT
CHLORAPREP W/TINT 26 (MISCELLANEOUS) ×1 IMPLANT
CLIP LIGATING HEM O LOK PURPLE (MISCELLANEOUS) ×1 IMPLANT
COVER MAYO STAND XLG (MISCELLANEOUS) ×1 IMPLANT
COVER TIP SHEARS 8 DVNC (MISCELLANEOUS) ×1 IMPLANT
DEFOGGER SCOPE WARMER CLEARIFY (MISCELLANEOUS) IMPLANT
DERMABOND ADVANCED .7 DNX12 (GAUZE/BANDAGES/DRESSINGS) ×1 IMPLANT
DRAPE ARM DVNC X/XI (DISPOSABLE) ×4 IMPLANT
DRAPE COLUMN DVNC XI (DISPOSABLE) ×1 IMPLANT
DRAPE HALF SHEET 40X57 (DRAPES) ×1 IMPLANT
ELECT REM PT RETURN 9FT ADLT (ELECTROSURGICAL) ×1
ELECTRODE REM PT RTRN 9FT ADLT (ELECTROSURGICAL) ×1 IMPLANT
FORCEPS BPLR R/ABLATION 8 DVNC (INSTRUMENTS) ×1 IMPLANT
FORCEPS PROGRASP DVNC XI (FORCEP) ×1 IMPLANT
GLOVE BIOGEL PI IND STRL 7.0 (GLOVE) ×2 IMPLANT
GLOVE SURG SS PI 7.5 STRL IVOR (GLOVE) ×2 IMPLANT
GOWN STRL REUS W/TWL LRG LVL3 (GOWN DISPOSABLE) ×3 IMPLANT
GRASPER SUT TROCAR 14GX15 (MISCELLANEOUS) IMPLANT
IRRIGATOR SUCT 8 DISP DVNC XI (IRRIGATION / IRRIGATOR) IMPLANT
IV NS IRRIG 3000ML ARTHROMATIC (IV SOLUTION) IMPLANT
KIT TURNOVER KIT A (KITS) ×1 IMPLANT
MANIFOLD NEPTUNE II (INSTRUMENTS) ×1 IMPLANT
NDL HYPO 21X1.5 SAFETY (NEEDLE) ×1 IMPLANT
NDL INSUFFLATION 14GA 120MM (NEEDLE) ×1 IMPLANT
NEEDLE HYPO 21X1.5 SAFETY (NEEDLE) ×1 IMPLANT
NEEDLE INSUFFLATION 14GA 120MM (NEEDLE) ×1 IMPLANT
OBTURATOR OPTICAL STND 8 DVNC (TROCAR) ×1
OBTURATOR OPTICALSTD 8 DVNC (TROCAR) ×1 IMPLANT
PACK LAP CHOLE LZT030E (CUSTOM PROCEDURE TRAY) ×1 IMPLANT
PAD ARMBOARD 7.5X6 YLW CONV (MISCELLANEOUS) ×1 IMPLANT
PENCIL HANDSWITCHING (ELECTRODE) ×1 IMPLANT
SCISSORS MNPLR CVD DVNC XI (INSTRUMENTS) ×1 IMPLANT
SEAL CANN UNIV 5-8 DVNC XI (MISCELLANEOUS) ×4 IMPLANT
SET TUBE SMOKE EVAC HIGH FLOW (TUBING) ×1 IMPLANT
SPIKE FLUID TRANSFER (MISCELLANEOUS) ×1 IMPLANT
SUT MNCRL AB 4-0 PS2 18 (SUTURE) ×2 IMPLANT
SUT VICRYL 0 AB UR-6 (SUTURE) ×1 IMPLANT
SYR 20ML LL LF (SYRINGE) IMPLANT
SYS RETRIEVAL 5MM INZII UNIV (BASKET) ×1
SYSTEM RETRIEVL 5MM INZII UNIV (BASKET) ×1 IMPLANT
WATER STERILE IRR 500ML POUR (IV SOLUTION) ×1 IMPLANT

## 2022-08-28 NOTE — Transfer of Care (Signed)
Immediate Anesthesia Transfer of Care Note  Patient: Darlene [name]  Procedure(s) Performed: XI ROBOTIC ASSISTED LAPAROSCOPIC CHOLECYSTECTOMY (Abdomen)  Patient Location: PACU  Anesthesia Type:General  Level of Consciousness: awake  Airway & Oxygen Therapy: Patient Spontanous Breathing and Patient connected to face mask oxygen  Post-op Assessment: Report given to RN and Post -op Vital signs reviewed and stable  Post vital signs: Reviewed and stable  Last Vitals:  Vitals Value Taken Time  BP 140/78 08/28/22 1307  Temp    Pulse 87 08/28/22 1307  Resp 18 08/28/22 1307  SpO2 99 % 08/28/22 1307  Vitals shown include unvalidated device data.  Last Pain:  Vitals:   08/28/22 0926  TempSrc: Oral  PainSc: 0-No pain         Complications: No notable events documented.

## 2022-08-28 NOTE — Op Note (Signed)
Patient:  Darlene [name]  DOB:  09-15-1970  MRN:  [id number]   Preop Diagnosis: Cholecystitis, cholelithiasis  Postop Diagnosis: Same  Procedure: Robotic assisted laparoscopic cholecystectomy  Surgeon: Franky Macho, MD  Anes: General endotracheal  Indications: Patient is a 52 year old white female who presents with acute cholecystitis secondary to cholelithiasis.  The risks and benefits of the procedure including bleeding, infection, hepatobiliary injury, and the possibility of an open procedure were fully explained to the patient, who gave informed consent.  Procedure note: The patient was placed in the supine position.  After induction of general endotracheal anesthesia, the abdomen was prepped and draped using the usual sterile technique with ChloraPrep.  Surgical site confirmation was performed.  An infraumbilical incision was made down to the fascia.  A Veress needle was introduced into the abdominal cavity and confirmation of placement was done using the saline drop test.  The abdomen was then insufflated to 15 mmHg pressure.  An 8 mm trocar was introduced into the abdominal cavity under direct visualization without difficulty.  The patient was placed in reverse Trendelenburg position and an additional 8 mm trocars were placed in the right flank, right mid abdomen, and left upper quadrant regions.  The robot was then targeted and docked.  The gallbladder was noted to have a thickened gallbladder wall and was distended with stones present.  I did start the dissection around the infundibulum of the gallbladder.  Once this was completed, I did do a partial dome down approach in order to expose the triangle of Calot.  The cystic artery was identified first.  I next identified the cystic artery.  Its junction to the infundibulum was fully identified.  The anatomy was confirmed by firefly.  Hem-o-lok clips were placed proximally and distally on the cystic artery and the artery was divided.  The  cystic duct was then ligated and divided using Hem-o-lok clips.  The gallbladder was then removed from the gallbladder fossa using Bovie electrocautery.  There was spillage of stones and these were retrieved using the irrigation and suction.  The gallbladder fossa was inspected and no abnormal bleeding or bile leakage was noted.  All fluid and air were then evacuated from the abdominal cavity.  The robot was then undocked and all trocars were removed.  All wounds were irrigated with normal saline.  All wounds were injected with 0.5% Sensorcaine.  The subcutaneous layer was reapproximated using 4-0 Monocryl subcuticular sutures.  Dermabond was applied.  All tape and needle counts were correct at the end of the procedure.  The patient was extubated in the operating room and transferred to PACU in stable condition.  Complications: None  EBL: Minimal  Specimen: Gallbladder

## 2022-08-28 NOTE — Interval H&P Note (Signed)
History and Physical Interval Note:  08/28/2022 10:33 AM  Darlene [name]  has presented today for surgery, with the diagnosis of Cholelithiasis.  The various methods of treatment have been discussed with the patient and family. After consideration of risks, benefits and other options for treatment, the patient has consented to  Procedure(s): XI ROBOTIC ASSISTED LAPAROSCOPIC CHOLECYSTECTOMY (N/A) as a surgical intervention.  The patient's history has been reviewed, patient examined, no change in status, stable for surgery.  I have reviewed the patient's chart and labs.  Questions were answered to the patient's satisfaction.     Franky Macho

## 2022-08-28 NOTE — Anesthesia Preprocedure Evaluation (Addendum)
Anesthesia Evaluation  Patient identified by MRN, date of birth, ID band Patient awake    Reviewed: Allergy & Precautions, H&P , NPO status , Patient's Chart, lab work & pertinent test results  Airway Mallampati: II  TM Distance: >3 FB Neck ROM: Full    Dental no notable dental hx. (+) Dental Advisory Given   Pulmonary neg pulmonary ROS   Pulmonary exam normal breath sounds clear to auscultation       Cardiovascular Exercise Tolerance: Good hypertension, Pt. on medications Normal cardiovascular exam Rhythm:Regular Rate:Normal     Neuro/Psych  PSYCHIATRIC DISORDERS Anxiety      Neuromuscular disease    GI/Hepatic Neg liver ROS, hiatal hernia,GERD  Medicated and Controlled,,  Endo/Other    Morbid obesity  Renal/GU negative Renal ROS  negative genitourinary   Musculoskeletal  (+) Arthritis , Osteoarthritis,    Abdominal   Peds negative pediatric ROS (+)  Hematology negative hematology ROS (+)   Anesthesia Other Findings   Reproductive/Obstetrics negative OB ROS                             Anesthesia Physical Anesthesia Plan  ASA: 3  Anesthesia Plan: General   Post-op Pain Management: Dilaudid IV   Induction: Intravenous  PONV Risk Score and Plan: 4 or greater and Ondansetron, Dexamethasone and Midazolam  Airway Management Planned: Oral ETT  Additional Equipment:   Intra-op Plan:   Post-operative Plan: Extubation in OR  Informed Consent: I have reviewed the patients History and Physical, chart, labs and discussed the procedure including the risks, benefits and alternatives for the proposed anesthesia with the patient or authorized representative who has indicated his/her understanding and acceptance.     Dental advisory given  Plan Discussed with: CRNA and Surgeon  Anesthesia Plan Comments:         Anesthesia Quick Evaluation

## 2022-08-28 NOTE — Anesthesia Postprocedure Evaluation (Signed)
Anesthesia Post Note  Patient: Darlene [name]  Procedure(s) Performed: XI ROBOTIC ASSISTED LAPAROSCOPIC CHOLECYSTECTOMY (Abdomen)  Patient location during evaluation: Phase II Anesthesia Type: General Level of consciousness: awake and alert and oriented Pain management: pain level controlled Vital Signs Assessment: post-procedure vital signs reviewed and stable Respiratory status: spontaneous breathing, nonlabored ventilation and respiratory function stable Cardiovascular status: blood pressure returned to baseline and stable Postop Assessment: no apparent nausea or vomiting Anesthetic complications: no  No notable events documented.   Last Vitals:  Vitals:   08/28/22 1400 08/28/22 1421  BP: 121/76 126/79  Pulse: 97 (!) 103  Resp: 20 18  Temp:  (!) 36.3 C  SpO2: 90% 94%    Last Pain:  Vitals:   08/28/22 1423  TempSrc:   PainSc: 0-No pain                 Darlene [name]

## 2022-08-28 NOTE — Anesthesia Procedure Notes (Signed)
Procedure Name: Intubation Date/Time: 08/28/2022 11:21 AM  Performed by: Julian Reil, CRNAPre-anesthesia Checklist: Patient identified, Emergency Drugs available, Suction available and Patient being monitored Patient Re-evaluated:Patient Re-evaluated prior to induction Oxygen Delivery Method: Circle system utilized Preoxygenation: Pre-oxygenation with 100% oxygen Induction Type: IV induction Ventilation: Mask ventilation without difficulty Laryngoscope Size: Miller and 3 Grade View: Grade I Tube type: Oral Tube size: 7.5 mm Number of attempts: 1 Airway Equipment and Method: Stylet Placement Confirmation: ETT inserted through vocal cords under direct vision, positive ETCO2 and breath sounds checked- equal and bilateral Secured at: 21 cm Tube secured with: Tape Dental Injury: Teeth and Oropharynx as per pre-operative assessment  Comments: 4x4s bite block used.

## 2022-08-29 LAB — SURGICAL PATHOLOGY

## 2022-09-04 ENCOUNTER — Telehealth: Payer: Self-pay | Admitting: *Deleted

## 2022-09-04 NOTE — Telephone Encounter (Signed)
Surgical Date: 08/28/2022 Procedure: Xi Robotic Assisted Laparoscopic Cholecystectomy   Received call from patient [phone]~ telephone.  Patient reports daily nausea with some vomiting. States that she has more "dry heaves" than actual vomiting. Reports that she is taking her nausea medications every 8 hours for slight relief. Reports that she is eating bland diet.   Patient denies fever/ chills, etc. States that incisions are healing well.   Please advise.

## 2022-09-05 ENCOUNTER — Ambulatory Visit (INDEPENDENT_AMBULATORY_CARE_PROVIDER_SITE_OTHER): Payer: BC Managed Care – PPO | Admitting: General Surgery

## 2022-09-05 ENCOUNTER — Encounter: Payer: Self-pay | Admitting: General Surgery

## 2022-09-05 DIAGNOSIS — Z09 Encounter for follow-up examination after completed treatment for conditions other than malignant neoplasm: Secondary | ICD-10-CM

## 2022-09-05 NOTE — Progress Notes (Signed)
Subjective:     Darlene [name]  For total telephone postoperative visit performed with patient.  She states she is doing well.  She has had only 1 bowel movement since her surgery.  She has resumed her medications for constipation.  She had an episode of dry heaves yesterday but feels fine otherwise.  She has had no significant abdominal pain.  She is off her pain medications.  She denies any fever or chills. Objective:    LMP 07/19/2022 (Approximate)   General:  alert and cooperative  Final pathology consistent with diagnosis     Assessment:    Doing well postoperatively. Constipation    Plan:   Patient was instructed to take milk of magnesia as needed for constipation.  She was instructed to notify me should she have any further issues.  She will bring her papers by for return to work.  Follow-up here as needed.  Total telephone time was 2 minutes.  As this was a part of the global surgical fee, this was not a billable visit.

## 2022-09-11 ENCOUNTER — Encounter: Payer: Self-pay | Admitting: Family Medicine

## 2022-09-13 ENCOUNTER — Telehealth: Payer: Self-pay | Admitting: Family Medicine

## 2022-09-13 NOTE — Telephone Encounter (Signed)
Paperwork completed and faxed to patient as requested. Faxed to [phone] and received confirmation.  ADA Substantiation form filled due to patient does not qualify for standard FMLA. Called [employer] to verify correct paperwork and was informed by Audie Clear that they needed the front page and last page starting at 21 filled out.   Called and informed patient. LMOVM.

## 2022-09-27 ENCOUNTER — Telehealth: Payer: Self-pay | Admitting: Cardiovascular Disease

## 2022-09-27 NOTE — Telephone Encounter (Signed)
Patient calling in to see if her appt for tomorrow is needed. She states since stopping the medication she hasn't had any issues. Please advise

## 2022-09-27 NOTE — Telephone Encounter (Signed)
Called and spoke with patient who states she doesn't feel like she needs to keep appt scheduled with Dr Elease Hashimoto tomorrow since stopping the metoprolol, she hasn't had any issues. Reminded her to complete her lab work to follow-up on cholesterol medication, states she will. Recall entered for one year follow up.

## 2022-09-28 ENCOUNTER — Ambulatory Visit: Payer: BC Managed Care – PPO | Admitting: Cardiovascular Disease

## 2022-10-08 ENCOUNTER — Other Ambulatory Visit: Payer: Self-pay | Admitting: Family Medicine

## 2022-10-08 DIAGNOSIS — F410 Panic disorder [episodic paroxysmal anxiety] without agoraphobia: Secondary | ICD-10-CM

## 2022-10-08 DIAGNOSIS — R0602 Shortness of breath: Secondary | ICD-10-CM

## 2022-10-08 DIAGNOSIS — R0789 Other chest pain: Secondary | ICD-10-CM

## 2022-10-08 DIAGNOSIS — R002 Palpitations: Secondary | ICD-10-CM

## 2022-10-12 ENCOUNTER — Encounter: Payer: Self-pay | Admitting: Cardiovascular Disease

## 2022-10-12 ENCOUNTER — Telehealth: Payer: Self-pay

## 2022-10-12 ENCOUNTER — Ambulatory Visit: Payer: BC Managed Care – PPO | Attending: Cardiovascular Disease

## 2022-10-12 ENCOUNTER — Encounter: Payer: Self-pay | Admitting: Family Medicine

## 2022-10-12 DIAGNOSIS — I471 Supraventricular tachycardia, unspecified: Secondary | ICD-10-CM

## 2022-10-12 NOTE — Progress Notes (Unsigned)
Enrolled for [employer] to mail a ZIO XT long term holter monitor to the patients address on file.  

## 2022-10-12 NOTE — Telephone Encounter (Signed)
Patient reporting over Mychart that she is having "racing heart" w/ shortness of breath. She states she had 2 episodes this afternoon lasting 10-15 mins each while she was at work. She reports that both times she was sitting down. Patient waspreviously on metoprolol 25 mg to treat palpitations and high BP but metoprolol was stopped 08/22/22 due to pauses and bradycardia on long term monitor. Discussed w/ Dr. Elease Hashimoto who ordered 14 day monitor. F/u appt w/ APP scheduled for 10/17/22, appt w/ Dr. Elease Hashimoto scheduled for 11/28/22.   Discussed ED precautions w/ patient who verbalizes understanding to call 911 if palps are accompanied by SOB and/or chest pain that do not get better w/ rest. Advised patient to take it easy this weekend and not overexert herself.

## 2022-10-15 ENCOUNTER — Other Ambulatory Visit: Payer: Self-pay | Admitting: Family Medicine

## 2022-10-15 DIAGNOSIS — G8929 Other chronic pain: Secondary | ICD-10-CM

## 2022-10-15 DIAGNOSIS — R7303 Prediabetes: Secondary | ICD-10-CM

## 2022-10-16 ENCOUNTER — Encounter: Payer: Self-pay | Admitting: Family Medicine

## 2022-10-17 ENCOUNTER — Ambulatory Visit (INDEPENDENT_AMBULATORY_CARE_PROVIDER_SITE_OTHER): Payer: BC Managed Care – PPO

## 2022-10-17 ENCOUNTER — Encounter: Payer: Self-pay | Admitting: Physician Assistant

## 2022-10-17 ENCOUNTER — Ambulatory Visit: Payer: BC Managed Care – PPO | Attending: Physician Assistant | Admitting: Physician Assistant

## 2022-10-17 VITALS — BP 118/88 | HR 95 | Ht 62.0 in | Wt 234.8 lb

## 2022-10-17 DIAGNOSIS — R0600 Dyspnea, unspecified: Secondary | ICD-10-CM | POA: Diagnosis not present

## 2022-10-17 DIAGNOSIS — R002 Palpitations: Secondary | ICD-10-CM | POA: Diagnosis not present

## 2022-10-17 DIAGNOSIS — R0789 Other chest pain: Secondary | ICD-10-CM | POA: Diagnosis not present

## 2022-10-17 DIAGNOSIS — E782 Mixed hyperlipidemia: Secondary | ICD-10-CM

## 2022-10-17 NOTE — Patient Instructions (Signed)
Medication Instructions:  Your physician recommends that you continue on your current medications as directed. Please refer to the Current Medication list given to you today.  *If you need a refill on your cardiac medications before your next appointment, please call your pharmacy*  Lab Work: Lipids and lft's today If you have labs (blood work) drawn today and your tests are completely normal, you will receive your results only by: MyChart Message (if you have MyChart) OR A paper copy in the mail If you have any lab test that is abnormal or we need to change your treatment, we will call you to review the results.  Testing/Procedures: Christena Deem- Long Term Monitor Instructions  Your physician has requested you wear a ZIO patch monitor for 14 days.  This is a single patch monitor. [employer] supplies one patch monitor per enrollment. Additional stickers are not available. Please do not apply patch if you will be having a Nuclear Stress Test,  Echocardiogram, Cardiac CT, MRI, or Chest Xray during the period you would be wearing the  monitor. The patch cannot be worn during these tests. You cannot remove and re-apply the  ZIO XT patch monitor.  Your ZIO patch monitor will be mailed 3 day USPS to your address on file. It may take 3-5 days  to receive your monitor after you have been enrolled.  Once you have received your monitor, please review the enclosed instructions. Your monitor  has already been registered assigning a specific monitor serial # to you.  Billing and Patient Assistance Program Information  We have supplied [employer] with any of your insurance information on file for billing purposes. [employer] offers a sliding scale Patient Assistance Program for patients that do not have  insurance, or whose insurance does not completely cover the cost of the ZIO monitor.  You must apply for the Patient Assistance Program to qualify for this discounted rate.  To apply, please call [employer] at  [phone], select option 4, select option 2, ask to apply for  Patient Assistance Program. Meredeth Ide will ask your household income, and how many people  are in your household. They will quote your out-of-pocket cost based on that information.  [employer] will also be able to set up a 47-month, interest-free payment plan if needed.  Applying the monitor   Shave hair from upper left chest.  Hold abrader disc by orange tab. Rub abrader in 40 strokes over the upper left chest as  indicated in your monitor instructions.  Clean area with 4 enclosed alcohol pads. Let dry.  Apply patch as indicated in monitor instructions. Patch will be placed under collarbone on left  side of chest with arrow pointing upward.  Rub patch adhesive wings for 2 minutes. Remove white label marked "1". Remove the white  label marked "2". Rub patch adhesive wings for 2 additional minutes.  While looking in a mirror, press and release button in center of patch. A small green light will  flash 3-4 times. This will be your only indicator that the monitor has been turned on.  Do not shower for the first 24 hours. You may shower after the first 24 hours.  Press the button if you feel a symptom. You will hear a small click. Record Date, Time and  Symptom in the Patient Logbook.  When you are ready to remove the patch, follow instructions on the last 2 pages of Patient  Logbook. Stick patch monitor onto the last page of Patient Logbook.  Place Patient Logbook  in the blue and white box. Use locking tab on box and tape box closed  securely. The blue and white box has prepaid postage on it. Please place it in the mailbox as  soon as possible. Your physician should have your test results approximately 7 days after the  monitor has been mailed back to [facility].  Call [facility] Customer Care at [phone] if you have questions regarding  your ZIO XT patch monitor. Call them immediately if you see an orange light  blinking on your  monitor.  If your monitor falls off in less than 4 days, contact our Monitor department at [phone].  If your monitor becomes loose or falls off after 4 days call [employer] at [phone] for  suggestions on securing your monitor    Follow-Up: At [facility], you and your health needs are our priority.  As part of our continuing mission to provide you with exceptional heart care, we have created designated Provider Care Teams.  These Care Teams include your primary Cardiologist (physician) and Advanced Practice Providers (APPs -  Physician Assistants and Nurse Practitioners) who all work together to provide you with the care you need, when you need it.   Your next appointment:   11/28/2022 at 11:40 AM  Provider:   Kristeen Miss, MD   Low-Sodium Eating Plan Salt (sodium) helps you keep a healthy balance of fluids in your body. Too much sodium can raise your blood pressure. It can also cause fluid and waste to be held in your body. Your health care provider or dietitian may recommend a low-sodium eating plan if you have high blood pressure (hypertension), kidney disease, liver disease, or heart failure. Eating less sodium can help lower your blood pressure and reduce swelling. It can also protect your heart, liver, and kidneys. What are tips for following this plan? Reading food labels  Check food labels for the amount of sodium per serving. If you eat more than one serving, you must multiply the listed amount by the number of servings. Choose foods with less than 140 milligrams (mg) of sodium per serving. Avoid foods with 300 mg of sodium or more per serving. Always check how much sodium is in a product, even if the label says "unsalted" or "no salt added." Shopping  Buy products labeled as "low-sodium" or "no salt added." Buy fresh foods. Avoid canned foods and pre-made or frozen meals. Avoid canned, cured, or processed meats. Buy breads that have less  than 80 mg of sodium per slice. Cooking  Eat more home-cooked food. Try to eat less restaurant, buffet, and fast food. Try not to add salt when you cook. Use salt-free seasonings or herbs instead of table salt or sea salt. Check with your provider or pharmacist before using salt substitutes. Cook with plant-based oils, such as canola, sunflower, or olive oil. Meal planning When eating at a restaurant, ask if your food can be made with less salt or no salt. Avoid dishes labeled as brined, pickled, cured, or smoked. Avoid dishes made with soy sauce, miso, or teriyaki sauce. Avoid foods that have monosodium glutamate (MSG) in them. MSG may be added to some restaurant food, sauces, soups, bouillon, and canned foods. Make meals that can be grilled, baked, poached, roasted, or steamed. These are often made with less sodium. General information Try to limit your sodium intake to 1,500-2,300 mg each day, or the amount told by your provider. What foods should I eat? Fruits Fresh, frozen, or canned fruit. Fruit  juice. Vegetables Fresh or frozen vegetables. "No salt added" canned vegetables. "No salt added" tomato sauce and paste. Low-sodium or reduced-sodium tomato and vegetable juice. Grains Low-sodium cereals, such as oats, puffed wheat and rice, and shredded wheat. Low-sodium crackers. Unsalted rice. Unsalted pasta. Low-sodium bread. Whole grain breads and whole grain pasta. Meats and other proteins Fresh or frozen meat, poultry, seafood, and fish. These should have no added salt. Low-sodium canned tuna and salmon. Unsalted nuts. Dried peas, beans, and lentils without added salt. Unsalted canned beans. Eggs. Unsalted nut butters. Dairy Milk. Soy milk. Cheese that is naturally low in sodium, such as ricotta cheese, fresh mozzarella, or Swiss cheese. Low-sodium or reduced-sodium cheese. Cream cheese. Yogurt. Seasonings and condiments Fresh and dried herbs and spices. Salt-free seasonings. Low-sodium  mustard and ketchup. Sodium-free salad dressing. Sodium-free light mayonnaise. Fresh or refrigerated horseradish. Lemon juice. Vinegar. Other foods Homemade, reduced-sodium, or low-sodium soups. Unsalted popcorn and pretzels. Low-salt or salt-free chips. The items listed above may not be all the foods and drinks you can have. Talk to a dietitian to learn more. What foods should I avoid? Vegetables Sauerkraut, pickled vegetables, and relishes. Olives. [place] fries. Onion rings. Regular canned vegetables, except low-sodium or reduced-sodium items. Regular canned tomato sauce and paste. Regular tomato and vegetable juice. Frozen vegetables in sauces. Grains Instant hot cereals. Bread stuffing, pancake, and biscuit mixes. Croutons. Seasoned rice or pasta mixes. Noodle soup cups. Boxed or frozen macaroni and cheese. Regular salted crackers. Self-rising flour. Meats and other proteins Meat or fish that is salted, canned, smoked, spiced, or pickled. Precooked or cured meat, such as sausages or meat loaves. Tomasa Blase. Ham. Pepperoni. Hot dogs. Corned beef. Chipped beef. Salt pork. Jerky. Pickled herring, anchovies, and sardines. Regular canned tuna. Salted nuts. Dairy Processed cheese and cheese spreads. Hard cheeses. Cheese curds. Blue cheese. Feta cheese. String cheese. Regular cottage cheese. Buttermilk. Canned milk. Fats and oils Salted butter. Regular margarine. Ghee. Bacon fat. Seasonings and condiments Onion salt, garlic salt, seasoned salt, table salt, and sea salt. Canned and packaged gravies. Worcestershire sauce. Tartar sauce. Barbecue sauce. Teriyaki sauce. Soy sauce, including reduced-sodium soy sauce. Steak sauce. Fish sauce. Oyster sauce. Cocktail sauce. Horseradish that you find on the shelf. Regular ketchup and mustard. Meat flavorings and tenderizers. Bouillon cubes. Hot sauce. Pre-made or packaged marinades. Pre-made or packaged taco seasonings. Relishes. Regular salad dressings. Salsa. Other  foods Salted popcorn and pretzels. Corn chips and puffs. Potato and tortilla chips. Canned or dried soups. Pizza. Frozen entrees and pot pies. The items listed above may not be all the foods and drinks you should avoid. Talk to a dietitian to learn more. This information is not intended to replace advice given to you by your health care provider. Make sure you discuss any questions you have with your health care provider. Document Revised: 05/18/2022 Document Reviewed: 05/18/2022 Elsevier Patient Education  2024 [employer]  Heart-Healthy Eating Plan Many factors influence your heart health, including eating and exercise habits. Heart health is also called coronary health. Coronary risk increases with abnormal blood fat (lipid) levels. A heart-healthy eating plan includes limiting unhealthy fats, increasing healthy fats, limiting salt (sodium) intake, and making other diet and lifestyle changes. What is my plan? Your health care provider may recommend that: You limit your fat intake to _________% or less of your total calories each day. You limit your saturated fat intake to _________% or less of your total calories each day. You limit the amount of cholesterol in your diet  to less than _________ mg per day. You limit the amount of sodium in your diet to less than _________ mg per day. What are tips for following this plan? Cooking Cook foods using methods other than frying. Baking, boiling, grilling, and broiling are all good options. Other ways to reduce fat include: Removing the skin from poultry. Removing all visible fats from meats. Steaming vegetables in water or broth. Meal planning  At meals, imagine dividing your plate into fourths: Fill one-half of your plate with vegetables and green salads. Fill one-fourth of your plate with whole grains. Fill one-fourth of your plate with lean protein foods. Eat 2-4 cups of vegetables per day. One cup of vegetables equals 1 cup (91 g)  broccoli or cauliflower florets, 2 medium carrots, 1 large bell pepper, 1 large sweet potato, 1 large tomato, 1 medium white potato, 2 cups (150 g) raw leafy greens. Eat 1-2 cups of fruit per day. One cup of fruit equals 1 small apple, 1 large banana, 1 cup (237 g) mixed fruit, 1 large orange,  cup (82 g) dried fruit, 1 cup (240 mL) 100% fruit juice. Eat more foods that contain soluble fiber. Examples include apples, broccoli, carrots, beans, peas, and barley. Aim to get 25-30 g of fiber per day. Increase your consumption of legumes, nuts, and seeds to 4-5 servings per week. One serving of dried beans or legumes equals  cup (90 g) cooked, 1 serving of nuts is  oz (12 almonds, 24 pistachios, or 7 walnut halves), and 1 serving of seeds equals  oz (8 g). Fats Choose healthy fats more often. Choose monounsaturated and polyunsaturated fats, such as olive and canola oils, avocado oil, flaxseeds, walnuts, almonds, and seeds. Eat more omega-3 fats. Choose salmon, mackerel, sardines, tuna, flaxseed oil, and ground flaxseeds. Aim to eat fish at least 2 times each week. Check food labels carefully to identify foods with trans fats or high amounts of saturated fat. Limit saturated fats. These are found in animal products, such as meats, butter, and cream. Plant sources of saturated fats include palm oil, palm kernel oil, and coconut oil. Avoid foods with partially hydrogenated oils in them. These contain trans fats. Examples are stick margarine, some tub margarines, cookies, crackers, and other baked goods. Avoid fried foods. General information Eat more home-cooked food and less restaurant, buffet, and fast food. Limit or avoid alcohol. Limit foods that are high in added sugar and simple starches such as foods made using white refined flour (white breads, pastries, sweets). Lose weight if you are overweight. Losing just 5-10% of your body weight can help your overall health and prevent diseases such as  diabetes and heart disease. Monitor your sodium intake, especially if you have high blood pressure. Talk with your health care provider about your sodium intake. Try to incorporate more vegetarian meals weekly. What foods should I eat? Fruits All fresh, canned (in natural juice), or frozen fruits. Vegetables Fresh or frozen vegetables (raw, steamed, roasted, or grilled). Green salads. Grains Most grains. Choose whole wheat and whole grains most of the time. Rice and pasta, including brown rice and pastas made with whole wheat. Meats and other proteins Lean, well-trimmed beef, veal, pork, and lamb. Chicken and [place] without skin. All fish and shellfish. Wild duck, rabbit, pheasant, and venison. Egg whites or low-cholesterol egg substitutes. Dried beans, peas, lentils, and tofu. Seeds and most nuts. Dairy Low-fat or nonfat cheeses, including ricotta and mozzarella. Skim or 1% milk (liquid, powdered, or evaporated). Buttermilk made  with low-fat milk. Nonfat or low-fat yogurt. Fats and oils Non-hydrogenated (trans-free) margarines. Vegetable oils, including soybean, sesame, sunflower, olive, avocado, peanut, safflower, corn, canola, and cottonseed. Salad dressings or mayonnaise made with a vegetable oil. Beverages Water (mineral or sparkling). Coffee and tea. Unsweetened ice tea. Diet beverages. Sweets and desserts Sherbet, gelatin, and fruit ice. Small amounts of dark chocolate. Limit all sweets and desserts. Seasonings and condiments All seasonings and condiments. The items listed above may not be a complete list of foods and beverages you can eat. Contact a dietitian for more options. What foods should I avoid? Fruits Canned fruit in heavy syrup. Fruit in cream or butter sauce. Fried fruit. Limit coconut. Vegetables Vegetables cooked in cheese, cream, or butter sauce. Fried vegetables. Grains Breads made with saturated or trans fats, oils, or whole milk. Croissants. Sweet rolls. Donuts.  High-fat crackers, such as cheese crackers and chips. Meats and other proteins Fatty meats, such as hot dogs, ribs, sausage, bacon, rib-eye roast or steak. High-fat deli meats, such as salami and bologna. Caviar. Domestic duck and goose. Organ meats, such as liver. Dairy Cream, sour cream, cream cheese, and creamed cottage cheese. Whole-milk cheeses. Whole or 2% milk (liquid, evaporated, or condensed). Whole buttermilk. Cream sauce or high-fat cheese sauce. Whole-milk yogurt. Fats and oils Meat fat, or shortening. Cocoa butter, hydrogenated oils, palm oil, coconut oil, palm kernel oil. Solid fats and shortenings, including bacon fat, salt pork, lard, and butter. Nondairy cream substitutes. Salad dressings with cheese or sour cream. Beverages Regular sodas and any drinks with added sugar. Sweets and desserts Frosting. Pudding. Cookies. Cakes. Pies. Milk chocolate or white chocolate. Buttered syrups. Full-fat ice cream or ice cream drinks. The items listed above may not be a complete list of foods and beverages to avoid. Contact a dietitian for more information. Summary Heart-healthy meal planning includes limiting unhealthy fats, increasing healthy fats, limiting salt (sodium) intake and making other diet and lifestyle changes. Lose weight if you are overweight. Losing just 5-10% of your body weight can help your overall health and prevent diseases such as diabetes and heart disease. Focus on eating a balance of foods, including fruits and vegetables, low-fat or nonfat dairy, lean protein, nuts and legumes, whole grains, and heart-healthy oils and fats. This information is not intended to replace advice given to you by your health care provider. Make sure you discuss any questions you have with your health care provider. Document Revised: 06/06/2021 Document Reviewed: 06/06/2021 Elsevier Patient Education  2024 [employer].

## 2022-10-17 NOTE — Progress Notes (Signed)
Is working 2 jobs.  She works 40 hours a week at [employer]    Patient Name: Darlene [name] Date of Encounter: 10/17/2022  PCP:  Raliegh Ip, DO   [facility]  Cardiologist:  Kristeen Miss, MD  Advanced Practice Provider:  No care team member to display Electrophysiologist:  None   hpi    Darlene [name] is a 52 y.o. female with a past medical history of chest pressure, hypertension, palpitations, dyspnea, OSA presents today for follow-up appointment.  Was last seen 06/23/2022 and at that time had some chest pressure, palpitations, dyspnea exertion for several weeks.  Chest pressure was described as midsternal, did not radiate, was associated with dyspnea and palpitations.  Could also experienced some lightheadedness as well.  She does not eat regularly with snacks regularly.  She knows this is not a healthy way to eat.  Healthy eating habits were discussed.  Suggested that she eliminate sodas.  She felt like her heart was beating fast like butterflies wanes.  May last for a few seconds to a few minutes.  Has OSA and was compliant with CPAP.  She does not get any exercise.  Works at [employer] so work is active but does not have a routine exercise plan.  She was put on metoprolol several years ago.  She does have a family history of heart disease (mother has CHF and brother had MIs in his 45s).  Today, she has 2 jobs and works 40 hours a week at [employer].  She has some grandbabies that she likes to spend time with and 1 is on the way (due in August).  Some shortness of breath but has not been that bad.  The other day she had some palpitations that lasted maybe 5 minutes.  Happened twice and was random.  Occurred while seated and heart rate was 122 bpm.  ZIO monitor was ordered but she has not received it yet.  We have placed 1 on her today.  We also updated some lab work today including lipid panel and LFTs.  Reports no shortness of breath nor dyspnea on exertion.  Reports no chest pain, pressure, or tightness. No edema, orthopnea, PND.   Past Medical History    Past Medical History:  Diagnosis Date   Chest pressure    Fatty liver    Fatty liver    GERD (gastroesophageal reflux disease)    Hiatal hernia    Hypertension    Hypokalemia    OA (osteoarthritis)    Palpitations    Plantar fasciitis    SOB (shortness of breath)    Vitamin D deficiency    Past Surgical History:  Procedure Laterality Date   CESAREAN SECTION  1993   FOOT SURGERY Right 1997   INSERTION OF MESH N/A 01/28/2016   Procedure: INSERTION OF MESH;  Surgeon: Axel Filler, MD;  Location: [facility];  Service: General;  Laterality: N/A;   INSERTION OF MESH  09/06/2018   Procedure: INSERTION OF MESH;  Surgeon: Axel Filler, MD;  Location: [facility];  Service: General;;   Right Knee Surgery     tubal ligation      Allergies  No Known Allergies   EKGs/Labs/Other Studies Reviewed:   The following studies were reviewed today: Cardiac Studies & Procedures       ECHOCARDIOGRAM  ECHOCARDIOGRAM COMPLETE 07/19/2022  Narrative ECHOCARDIOGRAM REPORT    Patient Name:   Darlene [name] Date of Exam: 07/19/2022 Medical Rec #:  [id number]        Height:       62.0 in Accession #:    [id number]       Weight:       254.8 lb Date of Birth:  12-17-1970        BSA:          2.119 m Patient Age:    51 years         BP:           121/96 mmHg Patient Gender: F                HR:           83 bpm. Exam Location:  [address]  Procedure: 2D Echo, 3D Echo, Cardiac Doppler and Color Doppler  Indications:    785.1 Palpitations  History:        Patient has no prior history of Echocardiogram examinations. Signs/Symptoms:Chest Pain and Dyspnea; Risk Factors:Sleep Apnea and Hypertension.  Sonographer:    Clearence Ped RCS Referring Phys: [phone] PHILIP J NAHSER  IMPRESSIONS   1. Left ventricular ejection fraction, by estimation, is 55 to 60%. The left ventricle has normal function.  The left ventricle has no regional wall motion abnormalities. Left ventricular diastolic parameters were normal. 2. Right ventricular systolic function is normal. The right ventricular size is normal. There is normal pulmonary artery systolic pressure. The estimated right ventricular systolic pressure is 22.0 mmHg. 3. The mitral valve is normal in structure. No evidence of mitral valve regurgitation. No evidence of mitral stenosis. 4. The aortic valve is tricuspid. Aortic valve regurgitation is not visualized. No aortic stenosis is present. 5. The inferior vena cava is normal in size with greater than 50% respiratory variability, suggesting right atrial pressure of 3 mmHg.  FINDINGS Left Ventricle: Left ventricular ejection fraction, by estimation, is 55 to 60%. The left ventricle has normal function. The left ventricle has no regional wall motion abnormalities. The left ventricular internal cavity size was normal in size. There is no left ventricular hypertrophy. Left ventricular diastolic parameters were normal.  Right Ventricle: The right ventricular size is normal. No increase in right ventricular wall thickness. Right ventricular systolic function is normal. There is normal pulmonary artery systolic pressure. The tricuspid regurgitant velocity is 2.18 m/s, and with an assumed right atrial pressure of 3 mmHg, the estimated right ventricular systolic pressure is 22.0 mmHg.  Left Atrium: Left atrial size was normal in size.  Right Atrium: Right atrial size was normal in size.  Pericardium: There is no evidence of pericardial effusion.  Mitral Valve: The mitral valve is normal in structure. No evidence of mitral valve regurgitation. No evidence of mitral valve stenosis.  Tricuspid Valve: The tricuspid valve is normal in structure. Tricuspid valve regurgitation is trivial.  Aortic Valve: The aortic valve is tricuspid. Aortic valve regurgitation is not visualized. No aortic stenosis is  present.  Pulmonic Valve: The pulmonic valve was normal in structure. Pulmonic valve regurgitation is trivial.  Aorta: The aortic root is normal in size and structure.  Venous: The inferior vena cava is normal in size with greater than 50% respiratory variability, suggesting right atrial pressure of 3 mmHg.  IAS/Shunts: No atrial level shunt detected by color flow Doppler.   LEFT VENTRICLE PLAX 2D LVIDd:         4.80 cm   Diastology LVIDs:         3.20 cm   LV e' medial:    10.80  cm/s LV PW:         0.70 cm   LV E/e' medial:  8.8 LV IVS:        0.70 cm   LV e' lateral:   13.10 cm/s LVOT diam:     1.90 cm   LV E/e' lateral: 7.3 LV SV:         55 LV SV Index:   26 LVOT Area:     2.84 cm  3D Volume EF: 3D EF:        61 % LV EDV:       85 ml LV ESV:       33 ml LV SV:        52 ml  RIGHT VENTRICLE RV Basal diam:  3.40 cm RV S prime:     12.80 cm/s TAPSE (M-mode): 2.2 cm RVSP:           22.0 mmHg  LEFT ATRIUM             Index        RIGHT ATRIUM           Index LA diam:        2.90 cm 1.37 cm/m   RA Pressure: 3.00 mmHg LA Vol (A2C):   34.9 ml 16.47 ml/m  RA Area:     11.40 cm LA Vol (A4C):   24.5 ml 11.56 ml/m  RA Volume:   26.70 ml  12.60 ml/m LA Biplane Vol: 29.6 ml 13.97 ml/m AORTIC VALVE LVOT Vmax:   93.40 cm/s LVOT Vmean:  62.400 cm/s LVOT VTI:    0.193 m  AORTA Ao Root diam: 2.60 cm Ao Asc diam:  2.70 cm  MITRAL VALVE               TRICUSPID VALVE MV Area (PHT):             TR Peak grad:   19.0 mmHg MV Decel Time:             TR Vmax:        218.00 cm/s MV E velocity: 95.40 cm/s  Estimated RAP:  3.00 mmHg MV A velocity: 74.90 cm/s  RVSP:           22.0 mmHg MV E/A ratio:  1.27 SHUNTS Systemic VTI:  0.19 m Systemic Diam: 1.90 cm  Dalton McleanMD Electronically signed by Wilfred Lacy Signature Date/Time: 07/19/2022/8:24:57 AM    Final    MONITORS  LONG TERM MONITOR (3-14 DAYS) 08/21/2022  Narrative   Pt has evidence of high degree AV block.   had a 9 second episode where HR was 24-28.   Mobitx I AV block   No symptoms reported   Patch Wear Time:  13 days and 9 hours (2024-03-06T20:24:21-0500 to 2024-03-20T07:00:59-0400)  Patient had a min HR of 24 bpm, max HR of 134 bpm, and avg HR of 79 bpm. Predominant underlying rhythm was Sinus Rhythm. 4 episode(s) of AV Block (High Grade) occurred, lasting a total of 9 secs. Second Degree AV Block-Mobitz I (Wenckebach) was present. Isolated SVEs were rare (<1.0%), SVE Couplets were rare (<1.0%), and no SVE Triplets were present. Isolated VEs were rare (<1.0%, 581), VE Couplets were rare (<1.0%, 5), and VE Triplets were rare (<1.0%, 1).   CT SCANS  CT CORONARY MORPH W/CTA COR W/SCORE 08/05/2022  Addendum 08/05/2022  8:01 AM ADDENDUM REPORT: 08/05/2022 07:59  ADDENDUM: OVER-READ INTERPRETATION  CT CHEST  The following report is an over-read performed by  radiologist Dr. [facility] on Creation date. This over-read does not include interpretation of cardiac or coronary anatomy or pathology. The CTA interpretation by the cardiologist is attached.  Comparison to portable chest x-ray 06/27/2022.  Visible major airways are patent. Lung volumes appear symmetric. Visible lungs are well aerated aside from mild dependent atelectasis. Mild curvilinear scarring also in the lateral segment of the right middle lobe. No pericardial or pleural effusion. Small or at most moderate gastric hiatal hernia. No visible mediastinal or hilar lymphadenopathy. Negative visible liver, spleen, intra-abdominal stomach in the upper abdomen. No acute osseous abnormality identified.  CONCLUSION:  Small hiatal hernia.  No acute extracardiac finding on coronary CTA.   Electronically Signed By: Odessa Fleming M.D. On: 08/05/2022 07:59  Narrative CLINICAL DATA:  51 yo female with chest pain  EXAM: Cardiac/Coronary CTA  TECHNIQUE: A non-contrast, gated CT scan was obtained with axial slices  of 3 mm through the heart for calcium scoring. Calcium scoring was performed using the Agatston method. A 120 kV prospective, gated, contrast cardiac scan was obtained. Gantry rotation speed was 250 msecs and collimation was 0.6 mm. Two sublingual nitroglycerin tablets (0.8 mg) were given. The 3D data set was reconstructed in 5% intervals of the 35-75% of the R-R cycle. Diastolic phases were analyzed on a dedicated workstation using MPR, MIP, and VRT modes. The patient received 95 cc of contrast.  FINDINGS: Image quality: Excellent.  Noise artifact is: Limited.  Coronary Arteries:  Normal coronary origin.  Right dominance.  Left main: The left main is a large caliber vessel with a normal take off from the left coronary cusp that bifurcates to form a left anterior descending artery and a left circumflex artery. There is no plaque or stenosis.  Left anterior descending artery: The LAD is patent without evidence of plaque or stenosis. The LAD gives off 2 patent diagonal branches.  Left circumflex artery: The LCX is non-dominant and patent with no evidence of plaque or stenosis. The LCX gives off 2 small patent obtuse marginal branches.  Right coronary artery: The RCA is dominant with normal take off from the right coronary cusp. There is no evidence of plaque or stenosis. The RCA gives off RV branch and terminates as a PDA and right posterolateral branch without evidence of plaque or stenosis.  Right Atrium: Right atrial size is within normal limits.  Right Ventricle: The right ventricular cavity is within normal limits.  Left Atrium: Left atrial size is normal in size with no left atrial appendage filling defect.  Left Ventricle: The ventricular cavity size is within normal limits. There are no stigmata of prior infarction. There is no abnormal filling defect.  Pulmonary arteries: Normal in size without proximal filling defect.  Pulmonary veins: Normal pulmonary venous  drainage.  Pericardium: Normal thickness with no significant effusion or calcium present.  Cardiac valves: The aortic valve is trileaflet without significant calcification. The mitral valve is normal structure without significant calcification.  Aorta: Normal caliber with no significant disease.  Extra-cardiac findings: See attached radiology report for non-cardiac structures.  IMPRESSION: 1. Coronary calcium score of 3.36. This was 36 percentile for age-, sex, and race-matched controls.  2. Normal coronary origin with right dominance.  3. No evidence of CAD.  RECOMMENDATIONS: 1. CAD-RADS 0: No evidence of CAD (0%). Consider non-atherosclerotic causes of chest pain.  Olga Millers, MD  Electronically Signed: By: Olga Millers M.D. On: 08/03/2022 11:28  EKG:  EKG is not ordered today.    Recent Labs: 07/21/2022: TSH 0.555 08/24/2022: ALT 24; Platelets 227 08/28/2022: BUN 23; Creatinine, Ser 0.70; Hemoglobin 13.6; Potassium 4.2; Sodium 140  Recent Lipid Panel    Component Value Date/Time   CHOL 143 07/21/2022 1028   TRIG 143 07/21/2022 1028   HDL 39 (L) 07/21/2022 1028   CHOLHDL 3.7 07/21/2022 1028   LDLCALC 79 07/21/2022 1028   LDLDIRECT 109 (H) 03/09/2020 1615     Home Medications   Current Meds  Medication Sig   albuterol (VENTOLIN HFA) 108 (90 Base) MCG/ACT inhaler INHALE 2 PUFFS BY MOUTH EVERY 4 HOURS AS NEEDED FOR WHEEZE OR FOR SHORTNESS OF BREATH   citalopram (CELEXA) 20 MG tablet Take 1 tablet (20 mg total) by mouth daily.   gabapentin (NEURONTIN) 100 MG capsule Take 1 capsule (100 mg total) by mouth 2 (two) times daily.   hydrochlorothiazide (HYDRODIURIL) 25 MG tablet Take 1 tablet (25 mg total) by mouth daily with breakfast.   ibuprofen (ADVIL) 800 MG tablet TAKE 1 TABLET BY MOUTH THREE TIMES A DAY   loratadine (CLARITIN) 10 MG tablet TAKE 1 TABLET BY MOUTH EVERY DAY   Plecanatide (TRULANCE) 3 MG TABS TAKE 3 MG BY MOUTH DAILY. FOR  CONSTIPATION. BRINGING IN COUPON (Patient taking differently: Take 3 mg by mouth at bedtime. For constipation.  Bringing in coupon)   Potassium Chloride ER 20 MEQ TBCR TAKE 1 TABLET BY MOUTH EVERY DAY   RABEprazole (ACIPHEX) 20 MG tablet Take 20 mg by mouth daily.   rosuvastatin (CRESTOR) 5 MG tablet Take 1 tablet (5 mg total) by mouth daily.   Semaglutide-Weight Management (WEGOVY) 0.25 MG/0.5ML SOAJ Inject 0.25 mg into the skin every 7 (seven) days. Month #1   Semaglutide-Weight Management (WEGOVY) 0.5 MG/0.5ML SOAJ Inject 0.5 mg into the skin every 7 (seven) days. Mnth #2   Semaglutide-Weight Management (WEGOVY) 1 MG/0.5ML SOAJ Inject 1 mg into the skin every 7 (seven) days. Month #3   Semaglutide-Weight Management (WEGOVY) 1.7 MG/0.75ML SOAJ Inject 1.7 mg into the skin every 7 (seven) days. Month #4   Semaglutide-Weight Management (WEGOVY) 2.4 MG/0.75ML SOAJ Inject 2.4 mg into the skin every 7 (seven) days.     Review of Systems      All other systems reviewed and are otherwise negative except as noted above.  Physical Exam    VS:  BP 118/88   Pulse 95   Ht 5\' 2"  (1.575 m)   Wt 234 lb 12.8 oz (106.5 kg)   SpO2 95%   BMI 42.95 kg/m  , BMI Body mass index is 42.95 kg/m.  Wt Readings from Last 3 Encounters:  10/17/22 234 lb 12.8 oz (106.5 kg)  08/28/22 245 lb (111.1 kg)  08/24/22 245 lb (111.1 kg)     GEN: Well nourished, well developed, in no acute distress. HEENT: normal. Neck: Supple, no JVD, carotid bruits, or masses. Cardiac: RRR, no murmurs, rubs, or gallops. No clubbing, cyanosis, edema.  Radials/PT 2+ and equal bilaterally.  Respiratory:  Respirations regular and unlabored, clear to auscultation bilaterally. GI: Soft, nontender, nondistended. MS: No deformity or atrophy. Skin: Warm and dry, no rash. Neuro:  Strength and sensation are intact. Psych: Normal affect.  Assessment & Plan    Chest tightness -Coronary CTA reviewed with no evidence of CAD.  Calcium score  was 3.36 which is 80th percentile for age, sex, and race matched controls -Continue current medications which include HCTZ 25 mg daily, Crestor 5  mg daily -She is not on aspirin because she takes ibuprofen 3 times a day  Palpitations -Her monitor showed several episodes of significant bradycardia and metoprolol was discontinued -Ordered another ZIO monitor and placed on the patient today -Beta-blocker was discontinued due to bradycardia -Heart rate is 95 today on exam, regular  Dyspnea -improved, occurred when she was having more frequent palpitations -Echocardiogram reviewed from March with normal LVEF.  Diastolic parameters normal.       Disposition: Follow up 3-4 months with Kristeen Miss, MD or APP.  Signed, Sharlene Dory, PA-C 10/17/2022, 5:30 PM [facility]

## 2022-10-17 NOTE — Progress Notes (Unsigned)
Applied a 14 day Zio XT monitor to patient in the office  Dr Nahser to read 

## 2022-10-18 LAB — HEPATIC FUNCTION PANEL
ALT: 37 IU/L — ABNORMAL HIGH (ref 0–32)
AST: 26 IU/L (ref 0–40)
Albumin: 4.2 g/dL (ref 3.9–4.9)
Alkaline Phosphatase: 136 IU/L — ABNORMAL HIGH (ref 44–121)
Bilirubin Total: 0.4 mg/dL (ref 0.0–1.2)
Bilirubin, Direct: 0.16 mg/dL (ref 0.00–0.40)
Total Protein: 6.9 g/dL (ref 6.0–8.5)

## 2022-10-18 LAB — LIPID PANEL
Chol/HDL Ratio: 2.5 ratio (ref 0.0–4.4)
Cholesterol, Total: 119 mg/dL (ref 100–199)
HDL: 47 mg/dL (ref >39)
LDL Chol Calc (NIH): 50 mg/dL (ref 0–99)
Triglycerides: 126 mg/dL (ref 0–149)
VLDL Cholesterol Cal: 22 mg/dL (ref 5–40)

## 2022-11-07 ENCOUNTER — Ambulatory Visit: Payer: BC Managed Care – PPO | Attending: Family Medicine

## 2022-11-07 DIAGNOSIS — R002 Palpitations: Secondary | ICD-10-CM | POA: Diagnosis not present

## 2022-11-11 ENCOUNTER — Other Ambulatory Visit: Payer: Self-pay | Admitting: Family Medicine

## 2022-11-11 DIAGNOSIS — G8929 Other chronic pain: Secondary | ICD-10-CM

## 2022-11-11 DIAGNOSIS — R7303 Prediabetes: Secondary | ICD-10-CM

## 2022-11-20 ENCOUNTER — Encounter: Payer: Self-pay | Admitting: Cardiovascular Disease

## 2022-11-26 ENCOUNTER — Encounter: Payer: Self-pay | Admitting: Cardiovascular Disease

## 2022-11-26 NOTE — Progress Notes (Signed)
Cardiology Office Note:    Date:  11/28/2022   ID:  Darlene [name], DOB [birth year], MRN [id number]  PCP:  Raliegh Ip, DO   [facility] Providers Cardiologist:  Edison Wollschlager    Referring MD: Raliegh Ip, DO   Chief Complaint  Patient presents with   Palpitations    History of Present Illness:    Darlene [name] is a 52 y.o. female with a hx of chest pressure, HTN, palpitations , dyspnea,  OSA   Has chest pressure , palpitations, dyspnea  for several week ,   Mid chest pressure , does not radiate,  associated with dyspnea and palps.  May have some lightheadedness.   Does not eat regularly ,  Snacks regularly  - she knows its not a healthy way to eat  [place] all day long   We discussed healthy healthy eating,  Needs to cut out the snacks Eliminate sodas    Feels like her heart is beating fast ,  feels like butterflys  May last for few seconds to a few minutes.   Has OSA , is compliant with hger CPAP .    Does not get any exercise  Works at [employer] in [place] .  So work is active .   Does not walk   Occurs at rest and with working .   She has had an irreg HR and was put on metoprolol several years ago   Wt is 254 lbs    Fam hx  mother has CHF Brother had MI in his 51s   November 28, 2022  Darlene [name] is seen for follow up visit  Has aytpical CP  Cor CTA from August 03, 2022 shows  CAC score is 3.36 ( 88th percentile for age / sex matched controls  No CAD   Labs from October 17, 2022 reveals total cholesterol of 119  HDL is 47 LDL is 50  triglyceride level is 126 These labs were on rosuvastatin 5 mg a day.  Event monitor sinus rhythm with no serious arrhythmias    Wt is 229 lbs ( down 25 lbs )        Past Medical History:  Diagnosis Date   Chest pressure    Fatty liver    Fatty liver    GERD (gastroesophageal reflux disease)    Hiatal hernia    Hypertension    Hypokalemia    OA (osteoarthritis)    Palpitations    Plantar  fasciitis    SOB (shortness of breath)    Vitamin D deficiency     Past Surgical History:  Procedure Laterality Date   CESAREAN SECTION  1993   FOOT SURGERY Right 1997   INSERTION OF MESH N/A 01/28/2016   Procedure: INSERTION OF MESH;  Surgeon: Axel Filler, MD;  Location: [facility];  Service: General;  Laterality: N/A;   INSERTION OF MESH  09/06/2018   Procedure: INSERTION OF MESH;  Surgeon: Axel Filler, MD;  Location: [facility];  Service: General;;   Right Knee Surgery     tubal ligation      Current Medications: Current Meds  Medication Sig   albuterol (VENTOLIN HFA) 108 (90 Base) MCG/ACT inhaler INHALE 2 PUFFS BY MOUTH EVERY 4 HOURS AS NEEDED FOR WHEEZE OR FOR SHORTNESS OF BREATH   citalopram (CELEXA) 20 MG tablet Take 1 tablet (20 mg total) by mouth daily.   gabapentin (NEURONTIN) 100 MG capsule Take 1 capsule (100 mg total) by mouth 2 (two) times  daily.   hydrochlorothiazide (HYDRODIURIL) 25 MG tablet Take 1 tablet (25 mg total) by mouth daily with breakfast.   ibuprofen (ADVIL) 800 MG tablet TAKE 1 TABLET BY MOUTH THREE TIMES A DAY   loratadine (CLARITIN) 10 MG tablet TAKE 1 TABLET BY MOUTH EVERY DAY   Plecanatide (TRULANCE) 3 MG TABS TAKE 3 MG BY MOUTH DAILY. FOR CONSTIPATION. BRINGING IN COUPON (Patient taking differently: Take 3 mg by mouth at bedtime. For constipation.  Bringing in coupon)   Potassium Chloride ER 20 MEQ TBCR TAKE 1 TABLET BY MOUTH EVERY DAY   RABEprazole (ACIPHEX) 20 MG tablet Take 20 mg by mouth daily.   rosuvastatin (CRESTOR) 5 MG tablet Take 1 tablet (5 mg total) by mouth daily.   Semaglutide-Weight Management (WEGOVY) 1 MG/0.5ML SOAJ Inject 1 mg into the skin every 7 (seven) days. Month #3     Allergies:   Patient has no known allergies.   Social History   Socioeconomic History   Marital status: Married    Spouse name: Not on file   Number of children: 3   Years of education: Not on file   Highest education level: Not on file  Occupational  History   Occupation: TEXTILE  Tobacco Use   Smoking status: Never   Smokeless tobacco: Never  Vaping Use   Vaping status: Never Used  Substance and Sexual Activity   Alcohol use: No    Alcohol/week: 0.0 standard drinks of alcohol   Drug use: No   Sexual activity: Not on file  Other Topics Concern   Not on file  Social History Narrative   Not on file   Social Determinants of Health   Financial Resource Strain: Not on file  Food Insecurity: Not on file  Transportation Needs: Not on file  Physical Activity: Not on file  Stress: Not on file  Social Connections: Unknown (09/15/2021)   Received from [facility]   Social Network    Social Network: Not on file     Family History: The patient's family history includes Diabetes in her mother; Heart disease in her brother and sister; High blood pressure in her mother; Hyperlipidemia in her brother and sister; Kidney cancer in her mother; Sleep apnea in her mother. There is no history of Colon cancer, Rectal cancer, or Stomach cancer.  ROS:   Please see the history of present illness.     All other systems reviewed and are negative.  EKGs/Labs/Other Studies Reviewed:    The following studies were reviewed today:   EKG:    Recent Labs: 07/21/2022: TSH 0.555 08/24/2022: Platelets 227 08/28/2022: BUN 23; Creatinine, Ser 0.70; Hemoglobin 13.6; Potassium 4.2; Sodium 140 10/17/2022: ALT 37  Recent Lipid Panel    Component Value Date/Time   CHOL 119 10/17/2022 1544   TRIG 126 10/17/2022 1544   HDL 47 10/17/2022 1544   CHOLHDL 2.5 10/17/2022 1544   LDLCALC 50 10/17/2022 1544   LDLDIRECT 109 (H) 03/09/2020 1615     Risk Assessment/Calculations:       Physical Exam:    Physical Exam: Blood pressure (!) 124/90, pulse 87, height 5' 2.5" (1.588 m), weight 229 lb 6.4 oz (104.1 kg), SpO2 93%.     GEN:  Well nourished, well developed in no acute distress HEENT: Normal NECK: No JVD; No carotid  bruits LYMPHATICS: No lymphadenopathy CARDIAC: RRR , no murmurs, rubs, gallops RESPIRATORY:  Clear to auscultation without rales, wheezing or rhonchi  ABDOMEN: Soft, non-tender, non-distended MUSCULOSKELETAL:  No  edema; No deformity  SKIN: Warm and dry NEUROLOGIC:  Alert and oriented x 3    ASSESSMENT:    1. Paroxysmal supraventricular tachycardia   2. Coronary artery calcification   3. Mixed hyperlipidemia   4. Hyperlipidemia LDL goal <70     PLAN:       Chest tightness, dyspnea, palpitations: Symptoms have largely resolved.  I think that she is deconditioned.  Advised her to exercise more.   2.  Morbid obesity: Advised continued efforts for weight loss.  3.  Hyperlipidemia: We have her on rosuvastatin.  Will continue to medications.  Will see her again in 1 year.          Medication Adjustments/Labs and Tests Ordered: Current medicines are reviewed at length with the patient today.  Concerns regarding medicines are outlined above.  No orders of the defined types were placed in this encounter.  No orders of the defined types were placed in this encounter.   Patient Instructions  Medication Instructions:  Your physician recommends that you continue on your current medications as directed. Please refer to the Current Medication list given to you today.  *If you need a refill on your cardiac medications before your next appointment, please call your pharmacy*   Lab Work: NONE If you have labs (blood work) drawn today and your tests are completely normal, you will receive your results only by: MyChart Message (if you have MyChart) OR A paper copy in the mail If you have any lab test that is abnormal or we need to change your treatment, we will call you to review the results.   Testing/Procedures: NONE   Follow-Up: At [facility], you and your health needs are our priority.  As part of our continuing mission to provide you with exceptional heart care,  we have created designated Provider Care Teams.  These Care Teams include your primary Cardiologist (physician) and Advanced Practice Providers (APPs -  Physician Assistants and Nurse Practitioners) who all work together to provide you with the care you need, when you need it.  We recommend signing up for the patient portal called "MyChart".  Sign up information is provided on this After Visit Summary.  MyChart is used to connect with patients for Virtual Visits (Telemedicine).  Patients are able to view lab/test results, encounter notes, upcoming appointments, etc.  Non-urgent messages can be sent to your provider as well.   To learn more about what you can do with MyChart, go to [email or web address].    Your next appointment:   1 year(s)  Provider:   Kristeen Miss, MD        Signed, Kristeen Miss, MD  11/28/2022 1:25 PM    [facility]

## 2022-11-28 ENCOUNTER — Encounter: Payer: Self-pay | Admitting: Cardiovascular Disease

## 2022-11-28 ENCOUNTER — Ambulatory Visit: Payer: BC Managed Care – PPO | Attending: Cardiovascular Disease | Admitting: Cardiovascular Disease

## 2022-11-28 VITALS — BP 124/90 | HR 87 | Ht 62.5 in | Wt 229.4 lb

## 2022-11-28 DIAGNOSIS — I471 Supraventricular tachycardia, unspecified: Secondary | ICD-10-CM | POA: Diagnosis not present

## 2022-11-28 DIAGNOSIS — I2510 Atherosclerotic heart disease of native coronary artery without angina pectoris: Secondary | ICD-10-CM | POA: Diagnosis not present

## 2022-11-28 DIAGNOSIS — E782 Mixed hyperlipidemia: Secondary | ICD-10-CM

## 2022-11-28 DIAGNOSIS — I2584 Coronary atherosclerosis due to calcified coronary lesion: Secondary | ICD-10-CM

## 2022-11-28 DIAGNOSIS — E785 Hyperlipidemia, unspecified: Secondary | ICD-10-CM

## 2022-11-28 DIAGNOSIS — G4733 Obstructive sleep apnea (adult) (pediatric): Secondary | ICD-10-CM | POA: Diagnosis not present

## 2022-11-28 NOTE — Patient Instructions (Signed)

## 2022-12-09 ENCOUNTER — Other Ambulatory Visit: Payer: Self-pay | Admitting: Family Medicine

## 2022-12-09 DIAGNOSIS — G8929 Other chronic pain: Secondary | ICD-10-CM

## 2022-12-09 DIAGNOSIS — R7303 Prediabetes: Secondary | ICD-10-CM

## 2022-12-20 NOTE — Progress Notes (Signed)
Subjective: ZO:[id number] recheck, pap smear PCP: Raliegh Ip, DO [id number] L Dirks is a 52 y.o. female presenting to clinic today for Pap smear and to discuss:  1. Morbid obesity, PreDM, HLD Started on Wegovy couple of months ago.  She is up to the 1.7 mg weekly dose.  She has been tolerating this without difficulty and only had a couple of days of nausea when transitioning to the 1 mg.  She has had no recurrence.  She continues to take her Aciphex, Crestor, Trulance and hydrochlorothiazide.  No reports of dizziness, chest pain, shortness of breath, blurred vision, blood in will.  Bowel movements are good with Trulance.  Denies any breakthrough acid reflux and in fact would like to trial off of the Aciphex now that she has had her gallbladder out.   ROS: Per HPI  No Known Allergies Past Medical History:  Diagnosis Date   Chest pressure    Fatty liver    Fatty liver    GERD (gastroesophageal reflux disease)    Hiatal hernia    Hypertension    Hypokalemia    OA (osteoarthritis)    Palpitations    Plantar fasciitis    SOB (shortness of breath)    Vitamin D deficiency     Current Outpatient Medications:    albuterol (VENTOLIN HFA) 108 (90 Base) MCG/ACT inhaler, INHALE 2 PUFFS BY MOUTH EVERY 4 HOURS AS NEEDED FOR WHEEZE OR FOR SHORTNESS OF BREATH, Disp: 20.1 each, Rfl: 1   citalopram (CELEXA) 20 MG tablet, Take 1 tablet (20 mg total) by mouth daily., Disp: 90 tablet, Rfl: 3   gabapentin (NEURONTIN) 100 MG capsule, Take 1 capsule (100 mg total) by mouth 2 (two) times daily., Disp: 180 capsule, Rfl: 3   hydrochlorothiazide (HYDRODIURIL) 25 MG tablet, Take 1 tablet (25 mg total) by mouth daily with breakfast., Disp: 90 tablet, Rfl: 3   ibuprofen (ADVIL) 800 MG tablet, TAKE 1 TABLET BY MOUTH THREE TIMES A DAY, Disp: 270 tablet, Rfl: 0   loratadine (CLARITIN) 10 MG tablet, TAKE 1 TABLET BY MOUTH EVERY DAY, Disp: 90 tablet, Rfl: 1   Plecanatide (TRULANCE) 3 MG TABS, TAKE 3 MG BY  MOUTH DAILY. FOR CONSTIPATION. BRINGING IN COUPON (Patient taking differently: Take 3 mg by mouth at bedtime. For constipation.  Bringing in coupon), Disp: 90 tablet, Rfl: 0   Potassium Chloride ER 20 MEQ TBCR, TAKE 1 TABLET BY MOUTH EVERY DAY, Disp: 90 tablet, Rfl: 0   RABEprazole (ACIPHEX) 20 MG tablet, Take 20 mg by mouth daily., Disp: , Rfl:    rosuvastatin (CRESTOR) 5 MG tablet, Take 1 tablet (5 mg total) by mouth daily., Disp: 90 tablet, Rfl: 3   Semaglutide-Weight Management (WEGOVY) 2.4 MG/0.75ML SOAJ, Inject 2.4 mg into the skin every 7 (seven) days., Disp: 9 mL, Rfl: 3 Social History   Socioeconomic History   Marital status: Married    Spouse name: Not on file   Number of children: 3   Years of education: Not on file   Highest education level: Not on file  Occupational History   Occupation: TEXTILE  Tobacco Use   Smoking status: Never   Smokeless tobacco: Never  Vaping Use   Vaping status: Never Used  Substance and Sexual Activity   Alcohol use: No    Alcohol/week: 0.0 standard drinks of alcohol   Drug use: No   Sexual activity: Not on file  Other Topics Concern   Not on file  Social History Narrative  Not on file   Social Determinants of Health   Financial Resource Strain: Not on file  Food Insecurity: Not on file  Transportation Needs: Not on file  Physical Activity: Not on file  Stress: Not on file  Social Connections: Unknown (09/15/2021)   [facility]   Social Network    Social Network: Not on file  Intimate Partner Violence: Unknown (08/19/2021)   Received from [facility]   HITS    Physically Hurt: Not on file    Insult or Talk Down To: Not on file    Threaten Physical Harm: Not on file    Scream or Curse: Not on file   Family History  Problem Relation Age of Onset   High blood pressure Mother    Diabetes Mother    Kidney cancer Mother    Sleep apnea Mother    Hyperlipidemia Sister    Heart disease  Sister    Heart disease Brother    Hyperlipidemia Brother    Colon cancer Neg Hx    Rectal cancer Neg Hx    Stomach cancer Neg Hx     Objective: Office vital signs reviewed. BP 118/77   Pulse 87   Temp 98.5 F (36.9 C)   Ht 5\' 2"  (1.575 m)   Wt 228 lb (103.4 kg)   LMP 07/19/2022 (Approximate)   SpO2 95%   BMI 41.70 kg/m   Physical Examination:  General: Awake, alert, well-appearing female, No acute distress HEENT: Normal    Neck: No masses palpated. No lymphadenopathy    Ears: Tympanic membranes intact, normal light reflex, no erythema, no bulging    Eyes: PERRLA, extraocular membranes intact, sclera white     Nose: nasal turbinates moist, no nasal discharge    Throat: moist mucus membranes, no erythema, no tonsillar exudate.  Airway is patent Cardio: regular rate and rhythm, S1S2 heard, no murmurs appreciated Pulm: clear to auscultation bilaterally, no wheezes, rhonchi or rales; normal work of breathing on room air GU: external vaginal tissue normal, cervix midline and retroverted, no punctate lesions on cervix appreciated, mild bloody discharge from cervical os, no cervical motion tenderness, no abdominal/ adnexal masses    Assessment/ Plan: 52 y.o. female   Morbid obesity (HCC)  Screening for malignant neoplasm of cervix - Plan: Cytology - PAP  Pre-diabetes  Pure hypercholesterolemia  She is losing appropriate amounts of weight and is down about 21 pounds since her last visit.  Continue Wegovy as prescribed.  Will plan to reconvene again in the next 3 to 4 months.  Will plan to repeat fasting lipid, CMP at next visit  Pap smear completed.  Some bloody discharge with Pap.  Anticipate she is perimenopausal at this point  No orders of the defined types were placed in this encounter.  No orders of the defined types were placed in this encounter.    Raliegh Ip, DO [facility] [phone]

## 2022-12-22 ENCOUNTER — Ambulatory Visit: Payer: BC Managed Care – PPO | Admitting: Family Medicine

## 2022-12-22 ENCOUNTER — Encounter: Payer: Self-pay | Admitting: Family Medicine

## 2022-12-22 ENCOUNTER — Other Ambulatory Visit (HOSPITAL_COMMUNITY)
Admission: RE | Admit: 2022-12-22 | Discharge: 2022-12-22 | Disposition: A | Discharge status: Home / Self Care | Payer: BC Managed Care – PPO | Source: Ambulatory Visit | Attending: Family Medicine | Admitting: Family Medicine

## 2022-12-22 DIAGNOSIS — R7303 Prediabetes: Secondary | ICD-10-CM | POA: Diagnosis not present

## 2022-12-22 DIAGNOSIS — Z124 Encounter for screening for malignant neoplasm of cervix: Secondary | ICD-10-CM

## 2022-12-22 DIAGNOSIS — E7800 Pure hypercholesterolemia, unspecified: Secondary | ICD-10-CM | POA: Diagnosis not present

## 2022-12-22 DIAGNOSIS — Z6841 Body Mass Index (BMI) 40.0 and over, adult: Secondary | ICD-10-CM

## 2022-12-22 DIAGNOSIS — Z0000 Encounter for general adult medical examination without abnormal findings: Secondary | ICD-10-CM

## 2022-12-22 NOTE — Patient Instructions (Addendum)
Watch Blood pressure. If it starts dropping below 110/60, cut the Hydrochlorothiazide in half or discontinue it completely. Fasting labs next visit  Preventive Care 15-50 Years Old, Female Preventive care refers to lifestyle choices and visits with your health care provider that can promote health and wellness. Preventive care visits are also called wellness exams. What can I expect for my preventive care visit? Counseling Your health care provider may ask you questions about your: Medical history, including: Past medical problems. Family medical history. Pregnancy history. Current health, including: Menstrual cycle. Method of birth control. Emotional well-being. Home life and relationship well-being. Sexual activity and sexual health. Lifestyle, including: Alcohol, nicotine or tobacco, and drug use. Access to firearms. Diet, exercise, and sleep habits. Work and work Astronomer. Sunscreen use. Safety issues such as seatbelt and bike helmet use. Physical exam Your health care provider will check your: Height and weight. These may be used to calculate your BMI (body mass index). BMI is a measurement that tells if you are at a healthy weight. Waist circumference. This measures the distance around your waistline. This measurement also tells if you are at a healthy weight and may help predict your risk of certain diseases, such as type 2 diabetes and high blood pressure. Heart rate and blood pressure. Body temperature. Skin for abnormal spots. What immunizations do I need?  Vaccines are usually given at various ages, according to a schedule. Your health care provider will recommend vaccines for you based on your age, medical history, and lifestyle or other factors, such as travel or where you work. What tests do I need? Screening Your health care provider may recommend screening tests for certain conditions. This may include: Lipid and cholesterol levels. Diabetes screening. This  is done by checking your blood sugar (glucose) after you have not eaten for a while (fasting). Pelvic exam and Pap test. Hepatitis B test. Hepatitis C test. HIV (human immunodeficiency virus) test. STI (sexually transmitted infection) testing, if you are at risk. Lung cancer screening. Colorectal cancer screening. Mammogram. Talk with your health care provider about when you should start having regular mammograms. This may depend on whether you have a family history of breast cancer. BRCA-related cancer screening. This may be done if you have a family history of breast, ovarian, tubal, or peritoneal cancers. Bone density scan. This is done to screen for osteoporosis. Talk with your health care provider about your test results, treatment options, and if necessary, the need for more tests. Follow these instructions at home: Eating and drinking  Eat a diet that includes fresh fruits and vegetables, whole grains, lean protein, and low-fat dairy products. Take vitamin and mineral supplements as recommended by your health care provider. Do not drink alcohol if: Your health care provider tells you not to drink. You are pregnant, may be pregnant, or are planning to become pregnant. If you drink alcohol: Limit how much you have to 0-1 drink a day. Know how much alcohol is in your drink. In the [place] one drink equals one 12 oz bottle of beer (355 mL), one 5 oz glass of wine (148 mL), or one 1 oz glass of hard liquor (44 mL). Lifestyle Brush your teeth every morning and night with fluoride toothpaste. Floss one time each day. Exercise for at least 30 minutes 5 or more days each week. Do not use any products that contain nicotine or tobacco. These products include cigarettes, chewing tobacco, and vaping devices, such as e-cigarettes. If you need help quitting, ask your health  care provider. Do not use drugs. If you are sexually active, practice safe sex. Use a condom or other form of protection to  prevent STIs. If you do not wish to become pregnant, use a form of birth control. If you plan to become pregnant, see your health care provider for a prepregnancy visit. Take aspirin only as told by your health care provider. Make sure that you understand how much to take and what form to take. Work with your health care provider to find out whether it is safe and beneficial for you to take aspirin daily. Find healthy ways to manage stress, such as: Meditation, yoga, or listening to music. Journaling. Talking to a trusted person. Spending time with friends and family. Minimize exposure to UV radiation to reduce your risk of skin cancer. Safety Always wear your seat belt while driving or riding in a vehicle. Do not drive: If you have been drinking alcohol. Do not ride with someone who has been drinking. When you are tired or distracted. While texting. If you have been using any mind-altering substances or drugs. Wear a helmet and other protective equipment during sports activities. If you have firearms in your house, make sure you follow all gun safety procedures. Seek help if you have been physically or sexually abused. What's next? Visit your health care provider once a year for an annual wellness visit. Ask your health care provider how often you should have your eyes and teeth checked. Stay up to date on all vaccines. This information is not intended to replace advice given to you by your health care provider. Make sure you discuss any questions you have with your health care provider. Document Revised: 10/27/2020 Document Reviewed: 10/27/2020 Elsevier Patient Education  2024 [employer].

## 2022-12-26 ENCOUNTER — Other Ambulatory Visit: Payer: Self-pay | Admitting: Family Medicine

## 2022-12-26 DIAGNOSIS — G8929 Other chronic pain: Secondary | ICD-10-CM

## 2022-12-26 DIAGNOSIS — R7303 Prediabetes: Secondary | ICD-10-CM

## 2022-12-27 ENCOUNTER — Encounter: Payer: Self-pay | Admitting: Family Medicine

## 2022-12-27 ENCOUNTER — Telehealth: Payer: Self-pay

## 2022-12-27 ENCOUNTER — Other Ambulatory Visit (HOSPITAL_COMMUNITY): Payer: Self-pay

## 2022-12-27 DIAGNOSIS — R7303 Prediabetes: Secondary | ICD-10-CM

## 2022-12-27 DIAGNOSIS — G8929 Other chronic pain: Secondary | ICD-10-CM

## 2022-12-27 MED ORDER — WEGOVY 2.4 MG/0.75ML ~~LOC~~ SOAJ
2.4000 mg | SUBCUTANEOUS | 0 refills | Status: DC
Start: 2022-12-27 — End: 2023-03-26

## 2022-12-27 NOTE — Telephone Encounter (Signed)
Pharmacy Patient Advocate Encounter   Received notification from CoverMyMeds that prior authorization for [facility] 2.4MG /0.75ML auto-injectors is required/requested.   Insurance verification completed.   The patient is insured through [facility] .   Per test claim: PA required; PA submitted to [employer] via CoverMyMeds Key/confirmation #/EOC  [id number] Status is pending

## 2023-01-01 ENCOUNTER — Other Ambulatory Visit (HOSPITAL_COMMUNITY): Payer: Self-pay

## 2023-01-01 NOTE — Telephone Encounter (Signed)
Pharmacy Patient Advocate Encounter  Received notification from [facility] that Prior Authorization for [facility] 2.4MG /0.75ML auto-injectors has been APPROVED from 12/27/22 to 12/27/23. Filled 12/27/22 at pharmacy   PA #/Case ID/Reference #: [id number]

## 2023-01-05 ENCOUNTER — Other Ambulatory Visit: Payer: Self-pay | Admitting: Family Medicine

## 2023-01-05 DIAGNOSIS — R0602 Shortness of breath: Secondary | ICD-10-CM

## 2023-01-05 DIAGNOSIS — F410 Panic disorder [episodic paroxysmal anxiety] without agoraphobia: Secondary | ICD-10-CM

## 2023-01-05 DIAGNOSIS — R0789 Other chest pain: Secondary | ICD-10-CM

## 2023-01-05 DIAGNOSIS — R002 Palpitations: Secondary | ICD-10-CM

## 2023-01-24 ENCOUNTER — Ambulatory Visit: Payer: BC Managed Care – PPO | Admitting: Adult Health

## 2023-02-06 ENCOUNTER — Other Ambulatory Visit: Payer: Self-pay | Admitting: Family Medicine

## 2023-02-25 ENCOUNTER — Other Ambulatory Visit: Payer: Self-pay | Admitting: Family Medicine

## 2023-02-25 DIAGNOSIS — F411 Generalized anxiety disorder: Secondary | ICD-10-CM

## 2023-02-25 DIAGNOSIS — R7303 Prediabetes: Secondary | ICD-10-CM

## 2023-02-25 DIAGNOSIS — G8929 Other chronic pain: Secondary | ICD-10-CM

## 2023-02-26 NOTE — Telephone Encounter (Signed)
Please call pt and confirm she is requesting. I sent in 2.4mg  wegovy back in August, not sure why these old strengths are coming for refill. Also, she was on Celexa 20mg  in March. Did she self increase to 40mg ?

## 2023-02-28 DIAGNOSIS — G4733 Obstructive sleep apnea (adult) (pediatric): Secondary | ICD-10-CM | POA: Diagnosis not present

## 2023-03-05 NOTE — Progress Notes (Unsigned)
[facility] [address] [place]. [place] [address] [phone]       OFFICE FOLLOW UP NOTE  Ms. Darlene [name] Date of Birth:  October 31, 1970 Medical Record Number:  [id number]    Primary neurologist: Dr. Frances Furbish Reason for visit: CPAP follow-up    SUBJECTIVE:   CHIEF COMPLAINT:  No chief complaint on file.   Follow-up visit:  Prior visit: 01/23/2022 Dr. Frances Furbish  Brief HPI:   Darlene [name] is a 52 y.o. female who was evaluated by Dr. Frances Furbish on 10/11/2021 for concern of underlying sleep apnea with reports of snoring, excessive daytime somnolence and witnessed apneas. Also noted RLS symptoms on Mirapex by PCP. ESS 12/24. FSS 37/63.  Sleep study 10/25/2021 showed overall mild OSA of total AHI 6.6/h and moderate during REM sleep with REM AHI 23.5/h, O2 nadir of 86%.  AutoPap initiated 12/07/2021.  At prior visit, compliance report showed excellent usage and optimal residual AHI.  Noted improvement of sleep quality and daytime energy levels.  ESS 1/24 (prior to CPAP 12/24).      Interval history:           ROS:   14 system review of systems performed and negative with exception of those listed in HPI  PMH:  Past Medical History:  Diagnosis Date   Chest pressure    Fatty liver    Fatty liver    GERD (gastroesophageal reflux disease)    Hiatal hernia    Hypertension    Hypokalemia    OA (osteoarthritis)    Palpitations    Plantar fasciitis    SOB (shortness of breath)    Vitamin D deficiency     PSH:  Past Surgical History:  Procedure Laterality Date   CESAREAN SECTION  1993   FOOT SURGERY Right 1997   INSERTION OF MESH N/A 01/28/2016   Procedure: INSERTION OF MESH;  Surgeon: Axel Filler, MD;  Location: [facility];  Service: General;  Laterality: N/A;   INSERTION OF MESH  09/06/2018   Procedure: INSERTION OF MESH;  Surgeon: Axel Filler, MD;  Location: [facility];  Service: General;;   Right Knee Surgery     tubal ligation      Social  History:  Social History   Socioeconomic History   Marital status: Married    Spouse name: Not on file   Number of children: 3   Years of education: Not on file   Highest education level: Not on file  Occupational History   Occupation: TEXTILE  Tobacco Use   Smoking status: Never   Smokeless tobacco: Never  Vaping Use   Vaping status: Never Used  Substance and Sexual Activity   Alcohol use: No    Alcohol/week: 0.0 standard drinks of alcohol   Drug use: No   Sexual activity: Not on file  Other Topics Concern   Not on file  Social History Narrative   Not on file   Social Determinants of Health   Financial Resource Strain: Not on file  Food Insecurity: Not on file  Transportation Needs: Not on file  Physical Activity: Not on file  Stress: Not on file  Social Connections: Unknown (09/15/2021)   Received from [facility], [facility]   Social Network    Social Network: Not on file  Intimate Partner Violence: Unknown (08/19/2021)   Received from [facility], [facility]   HITS    Physically Hurt: Not on file    Insult or Talk Down To: Not on file  Threaten Physical Harm: Not on file    Scream or Curse: Not on file    Family History:  Family History  Problem Relation Age of Onset   High blood pressure Mother    Diabetes Mother    Kidney cancer Mother    Sleep apnea Mother    Hyperlipidemia Sister    Heart disease Sister    Heart disease Brother    Hyperlipidemia Brother    Colon cancer Neg Hx    Rectal cancer Neg Hx    Stomach cancer Neg Hx     Medications:   Current Outpatient Medications on File Prior to Visit  Medication Sig Dispense Refill   albuterol (VENTOLIN HFA) 108 (90 Base) MCG/ACT inhaler INHALE 2 PUFFS BY MOUTH EVERY 4 HOURS AS NEEDED FOR WHEEZE OR FOR SHORTNESS OF BREATH 20.1 each 1   citalopram (CELEXA) 20 MG tablet Take 1 tablet (20 mg total) by mouth daily. 90 tablet 3   gabapentin (NEURONTIN) 100 MG capsule Take 1 capsule (100 mg  total) by mouth 2 (two) times daily. 180 capsule 3   hydrochlorothiazide (HYDRODIURIL) 25 MG tablet Take 1 tablet (25 mg total) by mouth daily with breakfast. 90 tablet 3   ibuprofen (ADVIL) 800 MG tablet TAKE 1 TABLET BY MOUTH THREE TIMES A DAY 270 tablet 0   loratadine (CLARITIN) 10 MG tablet TAKE 1 TABLET BY MOUTH EVERY DAY 90 tablet 1   Plecanatide (TRULANCE) 3 MG TABS TAKE 3 MG BY MOUTH DAILY. FOR CONSTIPATION. BRINGING IN COUPON (Patient taking differently: Take 3 mg by mouth at bedtime. For constipation.  Bringing in coupon) 90 tablet 0   Potassium Chloride ER 20 MEQ TBCR TAKE 1 TABLET BY MOUTH EVERY DAY 90 tablet 0   RABEprazole (ACIPHEX) 20 MG tablet Take 20 mg by mouth daily.     rosuvastatin (CRESTOR) 5 MG tablet Take 1 tablet (5 mg total) by mouth daily. 90 tablet 3   Semaglutide-Weight Management (WEGOVY) 2.4 MG/0.75ML SOAJ Inject 2.4 mg into the skin every 7 (seven) days. 9 mL 0   No current facility-administered medications on file prior to visit.    Allergies:  No Known Allergies    OBJECTIVE:  Physical Exam  There were no vitals filed for this visit. There is no height or weight on file to calculate BMI. No results found.   General: well developed, well nourished, seated, in no evident distress Head: head normocephalic and atraumatic.   Neck: supple with no carotid or supraclavicular bruits Cardiovascular: regular rate and rhythm, no murmurs Musculoskeletal: no deformity Skin:  no rash/petichiae Vascular:  Normal pulses all extremities   Neurologic Exam Mental Status: Awake and fully alert. Oriented to place and time. Recent and remote memory intact. Attention span, concentration and fund of knowledge appropriate. Mood and affect appropriate.  Cranial Nerves: Pupils equal, briskly reactive to light. Extraocular movements full without nystagmus. Visual fields full to confrontation. Hearing intact. Facial sensation intact. Face, tongue, palate moves normally and  symmetrically.  Motor: Normal bulk and tone. Normal strength in all tested extremity muscles Sensory.: intact to touch , pinprick , position and vibratory sensation.  Coordination: Rapid alternating movements normal in all extremities. Finger-to-nose and heel-to-shin performed accurately bilaterally. Gait and Station: Arises from chair without difficulty. Stance is normal. Gait demonstrates normal stride length and balance without use of AD. Tandem walk and heel toe without difficulty.  Reflexes: 1+ and symmetric. Toes downgoing.         ASSESSMENT/PLAN: Darlene L  [name] is a 52 y.o. year old female    OSA on CPAP : Compliance report shows satisfactory usage with optimal residual AHI.  Discussed continued nightly usage with ensuring greater than 4 hours nightly for optimal benefit and per insurance purposes.  Continue to follow with DME company for any needed supplies or CPAP related concerns     Follow up in *** or call earlier if needed   CC:  PCP: Raliegh Ip, DO    I spent *** minutes of face-to-face and non-face-to-face time with patient.  This included previsit chart review, lab review, study review, order entry, electronic health record documentation, patient education and discussion regarding above diagnoses and treatment plan and answered all other questions to patient's satisfaction    Ihor Austin, [facility]  [facility] [address] [place], [place] [address]  Phone [phone] Fax [phone] Note: This document was prepared with digital dictation and possible smart phrase technology. Any transcriptional errors that result from this process are unintentional.

## 2023-03-06 ENCOUNTER — Ambulatory Visit: Payer: BC Managed Care – PPO | Admitting: Adult Health

## 2023-03-06 ENCOUNTER — Encounter: Payer: Self-pay | Admitting: Adult Health

## 2023-03-06 VITALS — BP 120/87 | HR 87 | Ht 62.0 in | Wt 217.0 lb

## 2023-03-06 DIAGNOSIS — Z789 Other specified health status: Secondary | ICD-10-CM

## 2023-03-06 DIAGNOSIS — G4733 Obstructive sleep apnea (adult) (pediatric): Secondary | ICD-10-CM

## 2023-03-06 NOTE — Patient Instructions (Addendum)
Your Plan:  Repeat HST to evaluate sleep apnea still present  If so, will refer to dentistry Dr. Myrtis Ser or Dr. Toni Arthurs to discuss oral appliance. You can also see if your dentist provides these, if so please let me know and I will send a referral if needed          Thank you for coming to see [place] at [facility]. I hope we have been able to provide you high quality care today.  You may receive a patient satisfaction survey over the next few weeks. We would appreciate your feedback and comments so that we may continue to improve ourselves and the health of our patients.

## 2023-03-14 ENCOUNTER — Ambulatory Visit: Payer: BC Managed Care – PPO | Admitting: Neurology

## 2023-03-14 DIAGNOSIS — Z789 Other specified health status: Secondary | ICD-10-CM

## 2023-03-14 DIAGNOSIS — G4733 Obstructive sleep apnea (adult) (pediatric): Secondary | ICD-10-CM | POA: Diagnosis not present

## 2023-03-20 ENCOUNTER — Other Ambulatory Visit: Payer: Self-pay | Admitting: Family Medicine

## 2023-03-20 DIAGNOSIS — B9689 Other specified bacterial agents as the cause of diseases classified elsewhere: Secondary | ICD-10-CM

## 2023-03-22 ENCOUNTER — Telehealth: Payer: Self-pay | Admitting: Anesthesiology

## 2023-03-22 NOTE — Addendum Note (Signed)
Addended by: Berna Spare A on: 03/22/2023 05:03 PM   Modules accepted: Orders

## 2023-03-22 NOTE — Telephone Encounter (Signed)
-----   Message from Ihor Austin sent at 03/22/2023  4:47 PM EST ----- Please advise patient that recent HST showed persistent mild sleep apnea accentuated during REM sleep.  Treatment options include ongoing therapy with AutoPap, ongoing weight loss or an oral appliance if appropriate.  If she is interested in pursuing oral appliance, please send this information to Dr. Frances Furbish to see if she would be an appropriate candidate. Thank you.

## 2023-03-22 NOTE — Progress Notes (Signed)
See procedure note.

## 2023-03-22 NOTE — Procedures (Signed)
   [facility]  HOME SLEEP TEST (Watch PAT) REPORT  -  Mail-out Device  STUDY DATE: 03/21/23  DOB: [birth year]  MRN: [id number]  ORDERING CLINICIAN: Huston Foley, MD, PhD   REFERRING CLINICIAN: Ihor Austin, NP  CLINICAL INFORMATION/HISTORY: 52 year old right-handed woman with an underlying medical history of reflux disease, hypokalemia, hypertension, osteoarthritis, vitamin D deficiency, planter fasciitis, elbow pain, and severe obesity with a BMI of over 45, who presents for re-evaluation of her OSA. She was diagnosed with mild OSA in early 2023 and has achieved interim weight loss of over 30 lb. She has not been using her autoPAP consistently.  BMI: 39.7 kg/m  FINDINGS:   Sleep Summary:   Total Recording Time (hours, min): 8 hours, 49 min  Total Sleep Time (hours, min):  7 hours, 43 min  Percent REM (%):    31.4%   Respiratory Indices:   Calculated pAHI (per hour):  7.7/hour         REM pAHI:    17.4/hour       NREM pAHI: 3.3/hour  Central pAHI: 0/hour  Oxygen Saturation Statistics:    Oxygen Saturation (%) Mean: 94%   Minimum oxygen saturation (%):                 89%   O2 Saturation Range (%): 89 - 97%    O2 Saturation (minutes) <=88%: 0 min  Pulse Rate Statistics:   Pulse Mean (bpm):    80/min    Pulse Range (70 - 108/min)   IMPRESSION: OSA (obstructive sleep apnea), mild  RECOMMENDATION:  This home sleep test demonstrates overall mild obstructive sleep apnea with a total AHI of 7.7/hour and O2 nadir of 89%. Snoring was detected, and was intermittent, in the mild range, at times moderate. Treatment options include ongoing therapy with autoPAP, ongoing weight loss (where appropriate) along with avoidance of the supine sleep position (if possible), or an oral appliance in appropriate candidates.  The patient should be cautioned not to drive, work at heights, or operate dangerous or heavy equipment when tired or sleepy. Review and  reiteration of good sleep hygiene measures should be pursued with any patient. Other causes of the patient's symptoms, including circadian rhythm disturbances, an underlying mood disorder, medication effect and/or an underlying medical problem cannot be ruled out based on this test. Clinical correlation is recommended.  The patient and her referring provider will be notified of the test results. The patient will be seen in follow up in sleep clinic at [facility], as necessary.  I certify that I have reviewed the raw data recording prior to the issuance of this report in accordance with the standards of the American Academy of Sleep Medicine (AASM).  INTERPRETING PHYSICIAN:   Huston Foley, MD, PhD Medical Director, [facility]) Diplomat, ABPN (Neurology and Sleep)   [facility] [address], [address] [place], [place] [address] [phone]

## 2023-03-22 NOTE — Telephone Encounter (Signed)
Called pt and relayed recent HST results. Per Shanda Bumps HST showed persistent mild sleep apnea accentuated during REM sleep. Treatment options would include ongoing therapy with AutoPap, ongoing weight loss or an oral appliance. Pt stated she would like to get an oral appliance. Advised pt that a referral will be sent to Dr Irene Limbo and that they will be calling her to get her scheduled. Pt verbalized understanding and thanked me for calling.

## 2023-03-25 ENCOUNTER — Other Ambulatory Visit: Payer: Self-pay | Admitting: Family Medicine

## 2023-03-25 DIAGNOSIS — R0789 Other chest pain: Secondary | ICD-10-CM

## 2023-03-25 DIAGNOSIS — G8929 Other chronic pain: Secondary | ICD-10-CM

## 2023-03-25 DIAGNOSIS — R002 Palpitations: Secondary | ICD-10-CM

## 2023-03-25 DIAGNOSIS — R7303 Prediabetes: Secondary | ICD-10-CM

## 2023-03-25 DIAGNOSIS — R0602 Shortness of breath: Secondary | ICD-10-CM

## 2023-03-25 DIAGNOSIS — F411 Generalized anxiety disorder: Secondary | ICD-10-CM

## 2023-03-25 DIAGNOSIS — F410 Panic disorder [episodic paroxysmal anxiety] without agoraphobia: Secondary | ICD-10-CM

## 2023-03-26 ENCOUNTER — Encounter: Payer: Self-pay | Admitting: Family Medicine

## 2023-03-26 ENCOUNTER — Other Ambulatory Visit: Payer: Self-pay | Admitting: Family Medicine

## 2023-03-26 DIAGNOSIS — G8929 Other chronic pain: Secondary | ICD-10-CM

## 2023-03-26 DIAGNOSIS — R7303 Prediabetes: Secondary | ICD-10-CM

## 2023-03-26 MED ORDER — WEGOVY 2.4 MG/0.75ML ~~LOC~~ SOAJ: 2.4000 mg | SUBCUTANEOUS | 0 refills | Status: DC

## 2023-03-28 ENCOUNTER — Other Ambulatory Visit: Payer: Self-pay | Admitting: Family Medicine

## 2023-03-28 DIAGNOSIS — K5909 Other constipation: Secondary | ICD-10-CM

## 2023-04-30 ENCOUNTER — Other Ambulatory Visit: Payer: Self-pay | Admitting: Family Medicine

## 2023-04-30 DIAGNOSIS — K5909 Other constipation: Secondary | ICD-10-CM

## 2023-05-08 ENCOUNTER — Other Ambulatory Visit: Payer: Self-pay | Admitting: Family Medicine

## 2023-05-11 ENCOUNTER — Other Ambulatory Visit: Payer: Self-pay | Admitting: Family Medicine

## 2023-05-14 MED ORDER — IBUPROFEN 800 MG PO TABS: 800.0000 mg | ORAL_TABLET | Freq: Three times a day (TID) | ORAL | 3 refills | Status: DC | PRN

## 2023-05-21 ENCOUNTER — Ambulatory Visit: Payer: BC Managed Care – PPO | Admitting: Family Medicine

## 2023-05-22 ENCOUNTER — Encounter: Payer: Self-pay | Admitting: Family Medicine

## 2023-05-22 ENCOUNTER — Ambulatory Visit: Payer: BC Managed Care – PPO | Admitting: Family Medicine

## 2023-05-22 DIAGNOSIS — F411 Generalized anxiety disorder: Secondary | ICD-10-CM

## 2023-05-22 DIAGNOSIS — Z713 Dietary counseling and surveillance: Secondary | ICD-10-CM

## 2023-05-22 DIAGNOSIS — R7303 Prediabetes: Secondary | ICD-10-CM

## 2023-05-22 DIAGNOSIS — G8929 Other chronic pain: Secondary | ICD-10-CM

## 2023-05-22 DIAGNOSIS — K5909 Other constipation: Secondary | ICD-10-CM

## 2023-05-22 DIAGNOSIS — M25562 Pain in left knee: Secondary | ICD-10-CM

## 2023-05-22 LAB — BAYER DCA HB A1C WAIVED: HB A1C (BAYER DCA - WAIVED): 4.7 % — ABNORMAL LOW (ref 4.8–5.6)

## 2023-05-22 MED ORDER — TRULANCE 3 MG PO TABS: 3.0000 mg | ORAL_TABLET | Freq: Every day | ORAL | 3 refills | Status: DC

## 2023-05-22 MED ORDER — HYDROCHLOROTHIAZIDE 25 MG PO TABS: 25.0000 mg | ORAL_TABLET | Freq: Every day | ORAL | 3 refills | Status: DC | PRN

## 2023-05-22 MED ORDER — WEGOVY 2.4 MG/0.75ML ~~LOC~~ SOAJ: 2.4000 mg | SUBCUTANEOUS | 3 refills | Status: DC

## 2023-05-22 MED ORDER — CITALOPRAM HYDROBROMIDE 20 MG PO TABS: 20.0000 mg | ORAL_TABLET | Freq: Every day | ORAL | 3 refills | Status: DC

## 2023-05-22 NOTE — Patient Instructions (Signed)
 STOP hydrochlorothiazide  unless BP goes above 140/90 or you are having swelling. Then recommend starting with just 1/2 tablet as needed. STOP potassium unless needing to take hydrochlorothiazide  Continue Crestor  for now. Dr Alveta was prescribing that one.  I will collect cholesterol and send him a copy to make a decision about the med.

## 2023-05-22 NOTE — Progress Notes (Signed)
 Subjective: CC: Weight management PCP: Jolinda Norene HERO, DO Darlene [name] is a 53 y.o. female presenting to clinic today for:  1.  Obesity with prediabetes, hyperlipidemia and coronary artery calcifications Patient was started on Wegovy  last year.  She has had a very successful weight loss from 249 pounds to 208 pounds.  She would like to have repeat fasting labs today.  Under the care of Dr. Alveta and being treated with Crestor  5 mg daily for coronary artery calcification and hyperlipidemia.  She is wanting to start coming off of some of her medications.  Reports blood pressures have been controlled and she went all week last week without any HCTZ.  She is experience no ongoing edema.  She has been having sure to get enough protein and water  in daily and stays very physically active though does not have any scheduled exercise regimen outside of walking.  She started back on her Aciphex  because she was starting to have a little bit of breakthrough acid reflux but denies any abdominal pain, nausea, vomiting or issues with bowel movements.  She uses Trulance  as needed for constipation.   ROS: Per HPI  No Known Allergies Past Medical History:  Diagnosis Date   Chest pressure    Fatty liver    Fatty liver    GERD (gastroesophageal reflux disease)    Hiatal hernia    Hypertension    Hypokalemia    OA (osteoarthritis)    Palpitations    Plantar fasciitis    SOB (shortness of breath)    Vitamin D  deficiency     Current Outpatient Medications:    albuterol  (VENTOLIN  HFA) 108 (90 Base) MCG/ACT inhaler, INHALE 2 PUFFS BY MOUTH EVERY 4 HOURS AS NEEDED FOR WHEEZE OR FOR SHORTNESS OF BREATH, Disp: 20.1 each, Rfl: 0   citalopram  (CELEXA ) 20 MG tablet, Take 1 tablet (20 mg total) by mouth daily., Disp: 90 tablet, Rfl: 3   gabapentin  (NEURONTIN ) 100 MG capsule, Take 1 capsule (100 mg total) by mouth 2 (two) times daily., Disp: 180 capsule, Rfl: 3   hydrochlorothiazide  (HYDRODIURIL ) 25 MG  tablet, Take 1 tablet (25 mg total) by mouth daily with breakfast., Disp: 90 tablet, Rfl: 3   ibuprofen  (ADVIL ) 800 MG tablet, Take 1 tablet (800 mg total) by mouth every 8 (eight) hours as needed for moderate pain (pain score 4-6)., Disp: 270 tablet, Rfl: 3   loratadine (CLARITIN) 10 MG tablet, TAKE 1 TABLET BY MOUTH EVERY DAY, Disp: 90 tablet, Rfl: 1   Potassium Chloride  ER 20 MEQ TBCR, TAKE 1 TABLET BY MOUTH EVERY DAY, Disp: 90 tablet, Rfl: 0   RABEprazole  (ACIPHEX ) 20 MG tablet, Take 20 mg by mouth daily., Disp: , Rfl:    rosuvastatin  (CRESTOR ) 5 MG tablet, Take 1 tablet (5 mg total) by mouth daily., Disp: 90 tablet, Rfl: 3   Semaglutide -Weight Management (WEGOVY ) 2.4 MG/0.75ML SOAJ, Inject 2.4 mg into the skin every 7 (seven) days., Disp: 9 mL, Rfl: 0   TRULANCE  3 MG TABS, TAKE 1 TABLET (3 MG) BY MOUTH DAILY. FOR CONSTIPATION. BRINGING IN COUPON, Disp: 90 tablet, Rfl: 0 Social History   Socioeconomic History   Marital status: Married    Spouse name: Not on file   Number of children: 3   Years of education: Not on file   Highest education level: 10th grade  Occupational History   Occupation: TEXTILE  Tobacco Use   Smoking status: Never   Smokeless tobacco: Never  Vaping Use  Vaping status: Never Used  Substance and Sexual Activity   Alcohol use: No    Alcohol/week: 0.0 standard drinks of alcohol   Drug use: No   Sexual activity: Not on file  Other Topics Concern   Not on file  Social History Narrative   Not on file   Social Drivers of Health   Financial Resource Strain: Low Risk  (05/20/2023)   Overall Financial Resource Strain (CARDIA)    Difficulty of Paying Living Expenses: Not hard at all  Food Insecurity: No Food Insecurity (05/20/2023)   Hunger Vital Sign    Worried About Running Out of Food in the Last Year: Never true    Ran Out of Food in the Last Year: Never true  Transportation Needs: No Transportation Needs (05/20/2023)   PRAPARE - Scientist, Research (physical Sciences) (Medical): No    Lack of Transportation (Non-Medical): No  Physical Activity: Unknown (05/20/2023)   Exercise Vital Sign    Days of Exercise per Week: 0 days    Minutes of Exercise per Session: Not on file  Stress: No Stress Concern Present (05/20/2023)   [employer] of Occupational Health - Occupational Stress Questionnaire    Feeling of Stress : Not at all  Social Connections: Moderately Integrated (05/20/2023)   Social Connection and Isolation Panel [NHANES]    Frequency of Communication with Friends and Family: Three times a week    Frequency of Social Gatherings with Friends and Family: Once a week    Attends Religious Services: More than 4 times per year    Active Member of [employer] or Organizations: No    Attends Engineer, Structural: Not on file    Marital Status: Married  Intimate Partner Violence: Unknown (08/19/2021)   Received from [employer], [facility]   HITS    Physically Hurt: Not on file    Insult or Talk Down To: Not on file    Threaten Physical Harm: Not on file    Scream or Curse: Not on file   Family History  Problem Relation Age of Onset   High blood pressure Mother    Diabetes Mother    Kidney cancer Mother    Sleep apnea Mother    Hyperlipidemia Sister    Heart disease Sister    Heart disease Brother    Hyperlipidemia Brother    Colon cancer Neg Hx    Rectal cancer Neg Hx    Stomach cancer Neg Hx     Objective: Office vital signs reviewed. BP 109/79   Pulse 83   Temp 98.4 F (36.9 C) (Temporal)   Ht 5' 2 (1.575 m)   Wt 208 lb (94.3 kg)   LMP 07/19/2022 (Approximate)   SpO2 97%   BMI 38.04 kg/m   Physical Examination:  General: Awake, alert, obese, No acute distress HEENT:  sclera white, MMM Cardio: regular rate and rhythm, S1S2 heard, no murmurs appreciated Pulm: clear to auscultation bilaterally, no wheezes, rhonchi or rales; normal work of breathing on room air GI: soft, non-tender, non-distended, bowel  sounds present x4, no hepatomegaly, no splenomegaly, no masses Extremities: No edema  Assessment/ Plan: 53 y.o. female   Morbid obesity (HCC) - Plan: Lipid Panel, CMP14+EGFR, Bayer DCA Hb A1c Waived, Semaglutide -Weight Management (WEGOVY ) 2.4 MG/0.75ML SOAJ  Pre-diabetes - Plan: Bayer DCA Hb A1c Waived, Semaglutide -Weight Management (WEGOVY ) 2.4 MG/0.75ML SOAJ  Encounter for weight loss counseling  GAD (generalized anxiety disorder) - Plan: citalopram  (CELEXA ) 20 MG tablet  Chronic pain of left knee - Plan: Semaglutide -Weight Management (WEGOVY ) 2.4 MG/0.75ML SOAJ  Chronic constipation - Plan: Plecanatide  (TRULANCE ) 3 MG TABS  All meds renewed.  She is responding extremely well to the Wegovy  with a 41 pound weight loss so far.  She will continue the 2.4 mg weekly as prescribed.  Repeat fasting labs were collected and will CC results to cardiologist.  I suspect because she has already known coronary artery calcifications that low-dose Crestor  will probably still be something important to keep around but I am okay with her going to as needed HCTZ and potassium.  Discussed limiting use of Aciphex  for only as needed due to increased absorption issues of vitamins etc. with PPIs.   Norene CHRISTELLA Fielding, DO [facility] [phone]

## 2023-05-23 ENCOUNTER — Ambulatory Visit: Payer: BC Managed Care – PPO

## 2023-05-23 LAB — CMP14+EGFR
ALT: 21 [IU]/L (ref 0–32)
AST: 21 [IU]/L (ref 0–40)
Albumin: 4.2 g/dL (ref 3.9–4.9)
Alkaline Phosphatase: 123 [IU]/L — ABNORMAL HIGH (ref 44–121)
BUN/Creatinine Ratio: 11 (ref 9–23)
BUN: 8 mg/dL (ref 6–24)
Bilirubin Total: 0.3 mg/dL (ref 0.0–1.2)
CO2: 25 mmol/L (ref 20–29)
Calcium: 9.1 mg/dL (ref 8.7–10.2)
Chloride: 101 mmol/L (ref 96–106)
Creatinine, Ser: 0.73 mg/dL (ref 0.57–1.00)
Globulin, Total: 2.7 g/dL (ref 1.5–4.5)
Glucose: 81 mg/dL (ref 70–99)
Potassium: 3.9 mmol/L (ref 3.5–5.2)
Sodium: 140 mmol/L (ref 134–144)
Total Protein: 6.9 g/dL (ref 6.0–8.5)
eGFR: 100 mL/min/{1.73_m2} (ref >59)

## 2023-05-23 LAB — LIPID PANEL
Chol/HDL Ratio: 3.4 {ratio} (ref 0.0–4.4)
Cholesterol, Total: 171 mg/dL (ref 100–199)
HDL: 50 mg/dL (ref >39)
LDL Chol Calc (NIH): 91 mg/dL (ref 0–99)
Triglycerides: 176 mg/dL — ABNORMAL HIGH (ref 0–149)
VLDL Cholesterol Cal: 30 mg/dL (ref 5–40)

## 2023-05-31 DIAGNOSIS — G4733 Obstructive sleep apnea (adult) (pediatric): Secondary | ICD-10-CM | POA: Diagnosis not present

## 2023-06-05 ENCOUNTER — Encounter: Payer: Self-pay | Admitting: Family Medicine

## 2023-06-11 ENCOUNTER — Encounter (INDEPENDENT_AMBULATORY_CARE_PROVIDER_SITE_OTHER): Payer: Self-pay | Admitting: Family Medicine

## 2023-06-11 DIAGNOSIS — B9689 Other specified bacterial agents as the cause of diseases classified elsewhere: Secondary | ICD-10-CM

## 2023-06-11 DIAGNOSIS — J208 Acute bronchitis due to other specified organisms: Secondary | ICD-10-CM

## 2023-06-11 MED ORDER — BENZONATATE 200 MG PO CAPS: 200.0000 mg | ORAL_CAPSULE | Freq: Three times a day (TID) | ORAL | 0 refills | Status: DC | PRN

## 2023-06-11 MED ORDER — AZITHROMYCIN 250 MG PO TABS: ORAL_TABLET | ORAL | 0 refills | Status: DC

## 2023-06-11 NOTE — Telephone Encounter (Signed)

## 2023-06-14 ENCOUNTER — Ambulatory Visit: Payer: Self-pay | Admitting: Family Medicine

## 2023-06-14 NOTE — Telephone Encounter (Addendum)
  Additional Notes: This triage RN attempted to contact patient at this time. No answer, unable to leave voicemail. Attempt 1/3  Called patient for attempt 2 at 1232. No answer, unable to leave voicemail.  Attempt 2/3  This triage RN attempted to call patient at this time. No answer, unable to leave voicemail. Number is potentially invalid.  Attempt 3/3

## 2023-06-14 NOTE — Telephone Encounter (Signed)
E2C2 spoke with pt and will make appt for pt

## 2023-06-15 ENCOUNTER — Ambulatory Visit: Payer: BC Managed Care – PPO | Admitting: Nurse Practitioner

## 2023-06-15 ENCOUNTER — Encounter: Payer: Self-pay | Admitting: Nurse Practitioner

## 2023-06-15 ENCOUNTER — Ambulatory Visit: Payer: BC Managed Care – PPO

## 2023-06-15 VITALS — BP 128/83 | HR 107 | Temp 97.4°F | Ht 62.0 in | Wt 208.0 lb

## 2023-06-15 DIAGNOSIS — J40 Bronchitis, not specified as acute or chronic: Secondary | ICD-10-CM

## 2023-06-15 MED ORDER — PREDNISONE 20 MG PO TABS: 40.0000 mg | ORAL_TABLET | Freq: Every day | ORAL | 0 refills | Status: AC

## 2023-06-15 MED ORDER — HYDROCODONE BIT-HOMATROP MBR 5-1.5 MG/5ML PO SOLN: 5.0000 mL | Freq: Four times a day (QID) | ORAL | 0 refills | Status: DC | PRN

## 2023-06-15 NOTE — Progress Notes (Signed)
Subjective:    Patient ID: Darlene [name], female    DOB: August 22, 1970, 53 y.o.   MRN: [id number]   Chief Complaint: Cough   Has had a cough for 3 weeks. OTC meds not helping. Dr. Nadine Counts called in z pak this week. Not helping with cough.  Cough This is a new problem. The current episode started 1 to 4 weeks ago. The problem has been gradually worsening. The cough is Productive of sputum. Associated symptoms include ear congestion and rhinorrhea. Pertinent negatives include no chills, fever, sore throat or shortness of breath. Nothing aggravates the symptoms. She has tried OTC cough suppressant (zpak) for the symptoms. The treatment provided mild relief.    Patient Active Problem List   Diagnosis Date Noted   Coronary artery calcification 11/28/2022   Hyperlipidemia LDL goal <70 11/28/2022   Calculus of gallbladder with acute cholecystitis without obstruction 08/28/2022   S/P Nissen fundoplication (without gastrostomy tube) procedure 09/06/2018   Spondylosis of lumbar region without myelopathy or radiculopathy 03/13/2018   Plantar fasciitis 03/13/2018   Generalized OA 03/13/2018   Tic disorder 03/23/2017   Paroxysmal supraventricular tachycardia (HCC) 08/02/2016   GAD (generalized anxiety disorder) 08/02/2016   Fatty liver 03/16/2016   S/P Nissen fundoplication (with gastrostomy tube placement) (HCC) 01/28/2016   Allergic rhinitis 01/03/2016   Edema 01/03/2016   Hypokalemia 01/03/2016   Hiatal hernia 01/03/2016   Esophageal reflux 01/03/2016   Body mass index 34.0-34.9, adult 01/03/2016        Review of Systems  Constitutional:  Positive for fatigue. Negative for chills and fever.  HENT:  Positive for rhinorrhea. Negative for sore throat.   Respiratory:  Positive for cough. Negative for shortness of breath.        Objective:   Physical Exam Constitutional:      Appearance: Normal appearance. She is obese.  Cardiovascular:     Rate and Rhythm: Normal rate and  regular rhythm.  Pulmonary:     Effort: Pulmonary effort is normal.     Breath sounds: Normal breath sounds. No wheezing (bronchitis).     Comments: Deep dry cough Skin:    General: Skin is warm.  Neurological:     General: No focal deficit present.     Mental Status: She is alert.     BP 128/83   Pulse (!) 107   Temp (!) 97.4 F (36.3 C) (Temporal)   Ht 5\' 2"  (1.575 m)   Wt 208 lb (94.3 kg)   LMP 07/19/2022 (Approximate)   SpO2 95%   BMI 38.04 kg/m        Assessment & Plan:  Alicen L Michl in today with chief complaint of Cough   1. Bronchitis (Primary) 1. Take meds as prescribed 2. Use a cool mist humidifier especially during the winter months and when heat has been humid. 3. Use saline nose sprays frequently 4. Saline irrigations of the nose can be very helpful if done frequently.  * 4X daily for 1 week*  * Use of a nettie pot can be helpful with this. Follow directions with this* 5. Drink plenty of fluids 6. Keep thermostat turn down low 7.For any cough or congestion- hycodan only at night due to sedation 8. For fever or aces or pains- take tylenol or ibuprofen appropriate for age and weight.  * for fevers greater than 101 orally you may alternate ibuprofen and tylenol every  3 hours.    - HYDROcodone bit-homatropine (HYCODAN) 5-1.5 MG/5ML syrup; Take 5 mLs  by mouth every 6 (six) hours as needed for cough.  Dispense: 120 mL; Refill: 0 - predniSONE (DELTASONE) 20 MG tablet; Take 2 tablets (40 mg total) by mouth daily with breakfast for 5 days. 2 po daily for 5 days  Dispense: 10 tablet; Refill: 0    The above assessment and management plan was discussed with the patient. The patient verbalized understanding of and has agreed to the management plan. Patient is aware to call the clinic if symptoms persist or worsen. Patient is aware when to return to the clinic for a follow-up visit. Patient educated on when it is appropriate to go to the emergency department.    Mary-Margaret Daphine Deutscher, FNP

## 2023-06-15 NOTE — Patient Instructions (Signed)
1. Take meds as prescribed 2. Use a cool mist humidifier especially during the winter months and when heat has been humid. 3. Use saline nose sprays frequently 4. Saline irrigations of the nose can be very helpful if done frequently.  * 4X daily for 1 week*  * Use of a nettie pot can be helpful with this. Follow directions with this* 5. Drink plenty of fluids 6. Keep thermostat turn down low 7.For any cough or congestion- hycodan only at night due to sedation 8. For fever or aces or pains- take tylenol or ibuprofen appropriate for age and weight.  * for fevers greater than 101 orally you may alternate ibuprofen and tylenol every  3 hours.

## 2023-07-16 ENCOUNTER — Encounter: Payer: Self-pay | Admitting: Family Medicine

## 2023-07-16 ENCOUNTER — Ambulatory Visit: Admitting: Family Medicine

## 2023-07-16 VITALS — BP 124/79 | HR 100 | Temp 100.8°F | Ht 62.0 in | Wt 207.0 lb

## 2023-07-16 DIAGNOSIS — J329 Chronic sinusitis, unspecified: Secondary | ICD-10-CM | POA: Diagnosis not present

## 2023-07-16 DIAGNOSIS — R6889 Other general symptoms and signs: Secondary | ICD-10-CM

## 2023-07-16 LAB — VERITOR FLU A/B WAIVED
Influenza A: NEGATIVE
Influenza B: NEGATIVE

## 2023-07-16 MED ORDER — PREDNISONE 10 MG (21) PO TBPK: ORAL_TABLET | ORAL | 0 refills | Status: DC

## 2023-07-16 MED ORDER — OSELTAMIVIR PHOSPHATE 75 MG PO CAPS
75.0000 mg | ORAL_CAPSULE | Freq: Two times a day (BID) | ORAL | 0 refills | Status: AC
Start: 2023-07-16 — End: 2023-07-21

## 2023-07-16 MED ORDER — HYDROCODONE BIT-HOMATROP MBR 5-1.5 MG/5ML PO SOLN
5.0000 mL | Freq: Four times a day (QID) | ORAL | 0 refills | Status: DC | PRN
Start: 2023-07-16 — End: 2023-12-28

## 2023-07-16 NOTE — Patient Instructions (Signed)

## 2023-07-16 NOTE — Progress Notes (Signed)
 Subjective: CC: Flulike symptoms PCP: Raliegh Ip, DO Darlene [name] is a 53 y.o. female presenting to clinic today for:  1.  Flulike symptoms Patient reports abrupt onset this morning.  She reports rhinorrhea, ear irritation, myalgia, chills and sore throat.  No known sick contacts.  Was sick with a URI back in January but did not resolve until after use of prednisone and Hycodan syrup despite treatment of antibiotics.  She has not had influenza vaccination this year.   ROS: Per HPI  No Known Allergies Past Medical History:  Diagnosis Date   Chest pressure    Fatty liver    Fatty liver    GERD (gastroesophageal reflux disease)    Hiatal hernia    Hypertension    Hypokalemia    OA (osteoarthritis)    Palpitations    Plantar fasciitis    SOB (shortness of breath)    Vitamin D deficiency     Current Outpatient Medications:    albuterol (VENTOLIN HFA) 108 (90 Base) MCG/ACT inhaler, INHALE 2 PUFFS BY MOUTH EVERY 4 HOURS AS NEEDED FOR WHEEZE OR FOR SHORTNESS OF BREATH, Disp: 20.1 each, Rfl: 0   citalopram (CELEXA) 20 MG tablet, Take 1 tablet (20 mg total) by mouth daily., Disp: 90 tablet, Rfl: 3   gabapentin (NEURONTIN) 100 MG capsule, Take 1 capsule (100 mg total) by mouth 2 (two) times daily., Disp: 180 capsule, Rfl: 3   hydrochlorothiazide (HYDRODIURIL) 25 MG tablet, Take 1 tablet (25 mg total) by mouth daily as needed (swelling)., Disp: 90 tablet, Rfl: 3   ibuprofen (ADVIL) 800 MG tablet, Take 1 tablet (800 mg total) by mouth every 8 (eight) hours as needed for moderate pain (pain score 4-6)., Disp: 270 tablet, Rfl: 3   loratadine (CLARITIN) 10 MG tablet, TAKE 1 TABLET BY MOUTH EVERY DAY, Disp: 90 tablet, Rfl: 1   oseltamivir (TAMIFLU) 75 MG capsule, Take 1 capsule (75 mg total) by mouth 2 (two) times daily for 5 days., Disp: 10 capsule, Rfl: 0   Plecanatide (TRULANCE) 3 MG TABS, Take 1 tablet (3 mg total) by mouth daily., Disp: 90 tablet, Rfl: 3   Potassium  Chloride ER 20 MEQ TBCR, TAKE 1 TABLET BY MOUTH EVERY DAY, Disp: 90 tablet, Rfl: 0   predniSONE (STERAPRED UNI-PAK 21 TAB) 10 MG (21) TBPK tablet, As directed x 6 days, Disp: 21 tablet, Rfl: 0   RABEprazole (ACIPHEX) 20 MG tablet, Take 20 mg by mouth daily., Disp: , Rfl:    rosuvastatin (CRESTOR) 5 MG tablet, Take 1 tablet (5 mg total) by mouth daily., Disp: 90 tablet, Rfl: 3   Semaglutide-Weight Management (WEGOVY) 2.4 MG/0.75ML SOAJ, Inject 2.4 mg into the skin every 7 (seven) days., Disp: 9 mL, Rfl: 3   HYDROcodone bit-homatropine (HYCODAN) 5-1.5 MG/5ML syrup, Take 5 mLs by mouth every 6 (six) hours as needed for cough., Disp: 120 mL, Rfl: 0 Social History   Socioeconomic History   Marital status: Married    Spouse name: Not on file   Number of children: 3   Years of education: Not on file   Highest education level: 10th grade  Occupational History   Occupation: TEXTILE  Tobacco Use   Smoking status: Never   Smokeless tobacco: Never  Vaping Use   Vaping status: Never Used  Substance and Sexual Activity   Alcohol use: No    Alcohol/week: 0.0 standard drinks of alcohol   Drug use: No   Sexual activity: Not on file  Other  Topics Concern   Not on file  Social History Narrative   Not on file   Social Drivers of Health   Financial Resource Strain: Low Risk  (05/20/2023)   Overall Financial Resource Strain (CARDIA)    Difficulty of Paying Living Expenses: Not hard at all  Food Insecurity: No Food Insecurity (05/20/2023)   Hunger Vital Sign    Worried About Running Out of Food in the Last Year: Never true    Ran Out of Food in the Last Year: Never true  Transportation Needs: No Transportation Needs (05/20/2023)   PRAPARE - Administrator, Civil Service (Medical): No    Lack of Transportation (Non-Medical): No  Physical Activity: Unknown (05/20/2023)   Exercise Vital Sign    Days of Exercise per Week: 0 days    Minutes of Exercise per Session: Not on file  Stress: No  Stress Concern Present (05/20/2023)   [employer] of Occupational Health - Occupational Stress Questionnaire    Feeling of Stress : Not at all  Social Connections: Moderately Integrated (05/20/2023)   Social Connection and Isolation Panel [NHANES]    Frequency of Communication with Friends and Family: Three times a week    Frequency of Social Gatherings with Friends and Family: Once a week    Attends Religious Services: More than 4 times per year    Active Member of [employer] or Organizations: No    Attends Engineer, structural: Not on file    Marital Status: Married  Intimate Partner Violence: Unknown (08/19/2021)   Received from [employer], [facility]   HITS    Physically Hurt: Not on file    Insult or Talk Down To: Not on file    Threaten Physical Harm: Not on file    Scream or Curse: Not on file   Family History  Problem Relation Age of Onset   High blood pressure Mother    Diabetes Mother    Kidney cancer Mother    Sleep apnea Mother    Hyperlipidemia Sister    Heart disease Sister    Heart disease Brother    Hyperlipidemia Brother    Colon cancer Neg Hx    Rectal cancer Neg Hx    Stomach cancer Neg Hx     Objective: Office vital signs reviewed. BP 124/79   Pulse 100   Temp (!) 100.8 F (38.2 C)   Ht 5\' 2"  (1.575 m)   Wt 207 lb (93.9 kg)   LMP 07/19/2022 (Approximate)   SpO2 96%   BMI 37.86 kg/m   Physical Examination:  General: Awake, alert, well nourished, nontoxic female.  No acute distress HEENT: Normal    Neck: No masses palpated. No lymphadenopathy    Ears: Tympanic membranes intact, normal light reflex, no erythema, no bulging    Eyes: PERRLA, extraocular membranes intact, sclera white    Nose: nasal turbinates moist and erythematous with clear nasal discharge    Throat: moist mucus membranes, mild oropharyngeal erythema, no tonsillar exudate.  Airway is patent Cardio: regular rate and rhythm, S1S2 heard, no murmurs appreciated Pulm:  clear to auscultation bilaterally, no wheezes, rhonchi or rales; normal work of breathing on room air    Assessment/ Plan: 53 y.o. female   Flu-like symptoms - Plan: Veritor Flu A/B Waived, Novel Coronavirus, NAA ([facility]), oseltamivir (TAMIFLU) 75 MG capsule, predniSONE (STERAPRED UNI-PAK 21 TAB) 10 MG (21) TBPK tablet, HYDROcodone bit-homatropine (HYCODAN) 5-1.5 MG/5ML syrup  Clinically consistent with influenza.  Tamiflu sent.  Prednisone Dosepak and Hycodan sent.  Home care instructions were reviewed and reasons for reevaluation discussed.  She voiced good understanding will follow-up as needed.  Work note provided excusing the remainder of the week.   Raliegh Ip, DO [facility] [phone]

## 2023-07-17 LAB — NOVEL CORONAVIRUS, NAA: SARS-CoV-2, NAA: DETECTED — AB

## 2023-07-18 ENCOUNTER — Other Ambulatory Visit: Payer: Self-pay | Admitting: Family Medicine

## 2023-07-18 ENCOUNTER — Encounter: Payer: Self-pay | Admitting: Family Medicine

## 2023-07-18 DIAGNOSIS — U071 COVID-19: Secondary | ICD-10-CM

## 2023-07-18 MED ORDER — NIRMATRELVIR/RITONAVIR (PAXLOVID)TABLET: 3.0000 | ORAL_TABLET | Freq: Two times a day (BID) | ORAL | 0 refills | Status: AC

## 2023-07-24 ENCOUNTER — Other Ambulatory Visit: Payer: Self-pay | Admitting: Family Medicine

## 2023-07-24 DIAGNOSIS — B370 Candidal stomatitis: Secondary | ICD-10-CM

## 2023-07-24 MED ORDER — NYSTATIN 100000 UNIT/ML MT SUSP: OROMUCOSAL | 0 refills | Status: DC

## 2023-08-31 ENCOUNTER — Other Ambulatory Visit: Payer: Self-pay | Admitting: Family Medicine

## 2023-09-04 ENCOUNTER — Other Ambulatory Visit: Payer: Self-pay | Admitting: Family Medicine

## 2023-09-04 DIAGNOSIS — B370 Candidal stomatitis: Secondary | ICD-10-CM

## 2023-09-04 DIAGNOSIS — M47816 Spondylosis without myelopathy or radiculopathy, lumbar region: Secondary | ICD-10-CM

## 2023-09-04 DIAGNOSIS — G8929 Other chronic pain: Secondary | ICD-10-CM

## 2023-09-04 DIAGNOSIS — R7303 Prediabetes: Secondary | ICD-10-CM

## 2023-09-04 NOTE — Telephone Encounter (Signed)
 This was an automatic refill request by the pharmacy, pt does not need at this time.

## 2023-09-04 NOTE — Telephone Encounter (Signed)
 I want her to be seen if she is still having symptoms. This is unusual 1 month later.

## 2023-09-20 ENCOUNTER — Encounter: Payer: Self-pay | Admitting: Family Medicine

## 2023-10-09 ENCOUNTER — Other Ambulatory Visit: Payer: Self-pay | Admitting: Family Medicine

## 2023-10-09 DIAGNOSIS — Z1231 Encounter for screening mammogram for malignant neoplasm of breast: Secondary | ICD-10-CM

## 2023-10-10 ENCOUNTER — Ambulatory Visit
Admission: RE | Admit: 2023-10-10 | Discharge: 2023-10-10 | Disposition: A | Discharge status: Home / Self Care | Source: Ambulatory Visit | Attending: Family Medicine

## 2023-10-10 DIAGNOSIS — Z1231 Encounter for screening mammogram for malignant neoplasm of breast: Secondary | ICD-10-CM

## 2023-10-15 ENCOUNTER — Telehealth: Admitting: Family

## 2023-10-15 ENCOUNTER — Encounter: Payer: Self-pay | Admitting: Family

## 2023-10-15 ENCOUNTER — Encounter: Payer: Self-pay | Admitting: Family Medicine

## 2023-10-15 DIAGNOSIS — J019 Acute sinusitis, unspecified: Secondary | ICD-10-CM | POA: Diagnosis not present

## 2023-10-15 MED ORDER — AMOXICILLIN-POT CLAVULANATE 875-125 MG PO TABS: 1.0000 | ORAL_TABLET | Freq: Two times a day (BID) | ORAL | 0 refills | Status: DC

## 2023-10-15 NOTE — Progress Notes (Signed)
 Virtual Visit Consent   Darlene [name], you are scheduled for a virtual visit with a [facility] provider today. Just as with appointments in the office, your consent must be obtained to participate. Your consent will be active for this visit and any virtual visit you may have with one of our providers in the next 365 days. If you have a MyChart account, a copy of this consent can be sent to you electronically.  As this is a virtual visit, video technology does not allow for your provider to perform a traditional examination. This may limit your provider's ability to fully assess your condition. If your provider identifies any concerns that need to be evaluated in person or the need to arrange testing (such as labs, EKG, etc.), we will make arrangements to do so. Although advances in technology are sophisticated, we cannot ensure that it will always work on either your end or our end. If the connection with a video visit is poor, the visit may have to be switched to a telephone visit. With either a video or telephone visit, we are not always able to ensure that we have a secure connection.  By engaging in this virtual visit, you consent to the provision of healthcare and authorize for your insurance to be billed (if applicable) for the services provided during this visit. Depending on your insurance coverage, you may receive a charge related to this service.  I need to obtain your verbal consent now. Are you willing to proceed with your visit today? Cimberly L Armistead has provided verbal consent on 10/15/2023 for a virtual visit (video or telephone). Tommas Fragmin, FNP  Date: 10/15/2023 3:01 PM   Virtual Visit via Video Note   I, Tommas Fragmin, connected with  SHARAYAH RENFROW  ([id number], [birth year]) on 10/15/23 at  6:00 PM EDT by a video-enabled telemedicine application and verified that I am speaking with the correct person using two identifiers.  Location: Patient: Virtual Visit Location Patient:  work Provider: Pharmacist, community: Home Office   I discussed the limitations of evaluation and management by telemedicine and the availability of in person appointments. The patient expressed understanding and agreed to proceed.    History of Present Illness: Darlene [name] is a 53 y.o. who identifies as a female who was assigned female at birth, and is being seen today for sinus congestion.  HPI: Sinusitis This is a new problem. The current episode started 1 to 4 weeks ago. The problem has been gradually worsening since onset. There has been no fever. Her pain is at a severity of 6/10. The pain is mild. Associated symptoms include congestion, headaches, sinus pressure and sneezing. Pertinent negatives include no coughing, ear pain or sore throat. Past treatments include oral decongestants. The treatment provided mild relief.    Problems:  Patient Active Problem List   Diagnosis Date Noted   Coronary artery calcification 11/28/2022   Hyperlipidemia LDL goal <70 11/28/2022   Calculus of gallbladder with acute cholecystitis without obstruction 08/28/2022   S/P Nissen fundoplication (without gastrostomy tube) procedure 09/06/2018   Spondylosis of lumbar region without myelopathy or radiculopathy 03/13/2018   Plantar fasciitis 03/13/2018   Generalized OA 03/13/2018   Tic disorder 03/23/2017   Paroxysmal supraventricular tachycardia (HCC) 08/02/2016   GAD (generalized anxiety disorder) 08/02/2016   Fatty liver 03/16/2016   S/P Nissen fundoplication (with gastrostomy tube placement) (HCC) 01/28/2016   Allergic rhinitis 01/03/2016   Edema 01/03/2016   Hypokalemia 01/03/2016   Hiatal  hernia 01/03/2016   Esophageal reflux 01/03/2016   Body mass index 34.0-34.9, adult 01/03/2016    Allergies: No Known Allergies Medications:  Current Outpatient Medications:    amoxicillin-clavulanate (AUGMENTIN) 875-125 MG tablet, Take 1 tablet by mouth 2 (two) times daily., Disp: 14 tablet, Rfl:  0   albuterol  (VENTOLIN  HFA) 108 (90 Base) MCG/ACT inhaler, INHALE 2 PUFFS BY MOUTH EVERY 4 HOURS AS NEEDED FOR WHEEZE OR FOR SHORTNESS OF BREATH, Disp: 20.1 each, Rfl: 0   citalopram  (CELEXA ) 20 MG tablet, Take 1 tablet (20 mg total) by mouth daily., Disp: 90 tablet, Rfl: 3   gabapentin  (NEURONTIN ) 100 MG capsule, TAKE 1 CAPSULE BY MOUTH TWICE A DAY, Disp: 180 capsule, Rfl: 0   hydrochlorothiazide  (HYDRODIURIL ) 25 MG tablet, Take 1 tablet (25 mg total) by mouth daily as needed (swelling)., Disp: 90 tablet, Rfl: 3   HYDROcodone  bit-homatropine (HYCODAN) 5-1.5 MG/5ML syrup, Take 5 mLs by mouth every 6 (six) hours as needed for cough., Disp: 120 mL, Rfl: 0   ibuprofen  (ADVIL ) 800 MG tablet, Take 1 tablet (800 mg total) by mouth every 8 (eight) hours as needed for moderate pain (pain score 4-6)., Disp: 270 tablet, Rfl: 3   loratadine (CLARITIN) 10 MG tablet, TAKE 1 TABLET BY MOUTH EVERY DAY, Disp: 90 tablet, Rfl: 1   nystatin  (MYCOSTATIN ) 100000 UNIT/ML suspension, Gargle and spit 1 teaspoonful every 6 hours., Disp: 480 mL, Rfl: 0   Plecanatide  (TRULANCE ) 3 MG TABS, Take 1 tablet (3 mg total) by mouth daily., Disp: 90 tablet, Rfl: 3   Potassium Chloride  ER 20 MEQ TBCR, TAKE 1 TABLET BY MOUTH EVERY DAY, Disp: 90 tablet, Rfl: 0   RABEprazole (ACIPHEX) 20 MG tablet, Take 20 mg by mouth daily., Disp: , Rfl:    rosuvastatin  (CRESTOR ) 5 MG tablet, Take 1 tablet (5 mg total) by mouth daily., Disp: 90 tablet, Rfl: 3   Semaglutide -Weight Management (WEGOVY ) 2.4 MG/0.75ML SOAJ, Inject 2.4 mg into the skin every 7 (seven) days., Disp: 9 mL, Rfl: 3  Observations/Objective: Patient is well-developed, well-nourished in no acute distress.  Resting comfortably  Head is normocephalic, atraumatic.  No labored breathing.  Speech is clear and coherent with logical content.  Patient is alert and oriented at baseline.  Maxillary pressure  Nasal congestion  Assessment and Plan: 1. Acute sinusitis, recurrence not  specified, unspecified location (Primary) - amoxicillin-clavulanate (AUGMENTIN) 875-125 MG tablet; Take 1 tablet by mouth 2 (two) times daily.  Dispense: 14 tablet; Refill: 0  - Take meds as prescribed - Use a cool mist humidifier  -Use saline nose sprays frequently -Force fluids -For any cough or congestion  Use plain Mucinex- regular strength or max strength is fine -For fever or aces or pains- take tylenol  or ibuprofen . -Throat lozenges if help -Follow up if symptoms worsen or do not improve    Follow Up Instructions: I discussed the assessment and treatment plan with the patient. The patient was provided an opportunity to ask questions and all were answered. The patient agreed with the plan and demonstrated an understanding of the instructions.  A copy of instructions were sent to the patient via MyChart unless otherwise noted below.     The patient was advised to call back or seek an in-person evaluation if the symptoms worsen or if the condition fails to improve as anticipated.    Tommas Fragmin, FNP

## 2023-10-25 ENCOUNTER — Encounter: Payer: Self-pay | Admitting: Physician Assistant

## 2023-11-27 ENCOUNTER — Other Ambulatory Visit: Payer: Self-pay | Admitting: Family Medicine

## 2023-11-27 ENCOUNTER — Other Ambulatory Visit: Payer: Self-pay | Admitting: Cardiovascular Disease

## 2023-11-27 DIAGNOSIS — Z79899 Other long term (current) drug therapy: Secondary | ICD-10-CM

## 2023-11-27 DIAGNOSIS — E876 Hypokalemia: Secondary | ICD-10-CM

## 2023-11-27 DIAGNOSIS — E782 Mixed hyperlipidemia: Secondary | ICD-10-CM

## 2023-11-27 DIAGNOSIS — B370 Candidal stomatitis: Secondary | ICD-10-CM

## 2023-11-27 NOTE — Telephone Encounter (Signed)
 This was a prn med from march.  Please have patient message me on Mychart or double book her in for a video visit with me today if needing a refill

## 2023-11-28 ENCOUNTER — Other Ambulatory Visit: Payer: Self-pay | Admitting: Family Medicine

## 2023-11-28 DIAGNOSIS — R7303 Prediabetes: Secondary | ICD-10-CM

## 2023-11-28 DIAGNOSIS — G8929 Other chronic pain: Secondary | ICD-10-CM

## 2023-11-30 DIAGNOSIS — H5213 Myopia, bilateral: Secondary | ICD-10-CM | POA: Diagnosis not present

## 2023-12-04 ENCOUNTER — Other Ambulatory Visit: Payer: Self-pay | Admitting: Family Medicine

## 2023-12-04 DIAGNOSIS — M47816 Spondylosis without myelopathy or radiculopathy, lumbar region: Secondary | ICD-10-CM

## 2023-12-28 ENCOUNTER — Encounter: Payer: Self-pay | Admitting: Family Medicine

## 2023-12-28 ENCOUNTER — Ambulatory Visit (INDEPENDENT_AMBULATORY_CARE_PROVIDER_SITE_OTHER): Payer: BC Managed Care – PPO | Admitting: Family Medicine

## 2023-12-28 VITALS — BP 120/70 | HR 82 | Temp 97.9°F | Ht 62.0 in | Wt 197.4 lb

## 2023-12-28 DIAGNOSIS — I10 Essential (primary) hypertension: Secondary | ICD-10-CM

## 2023-12-28 DIAGNOSIS — I471 Supraventricular tachycardia, unspecified: Secondary | ICD-10-CM | POA: Diagnosis not present

## 2023-12-28 DIAGNOSIS — E7800 Pure hypercholesterolemia, unspecified: Secondary | ICD-10-CM

## 2023-12-28 DIAGNOSIS — Z0001 Encounter for general adult medical examination with abnormal findings: Secondary | ICD-10-CM

## 2023-12-28 DIAGNOSIS — Z1329 Encounter for screening for other suspected endocrine disorder: Secondary | ICD-10-CM | POA: Diagnosis not present

## 2023-12-28 DIAGNOSIS — K760 Fatty (change of) liver, not elsewhere classified: Secondary | ICD-10-CM

## 2023-12-28 DIAGNOSIS — K5909 Other constipation: Secondary | ICD-10-CM

## 2023-12-28 DIAGNOSIS — Z0000 Encounter for general adult medical examination without abnormal findings: Secondary | ICD-10-CM

## 2023-12-28 DIAGNOSIS — R7303 Prediabetes: Secondary | ICD-10-CM | POA: Diagnosis not present

## 2023-12-28 DIAGNOSIS — F411 Generalized anxiety disorder: Secondary | ICD-10-CM

## 2023-12-28 DIAGNOSIS — R7989 Other specified abnormal findings of blood chemistry: Secondary | ICD-10-CM | POA: Diagnosis not present

## 2023-12-28 DIAGNOSIS — M47816 Spondylosis without myelopathy or radiculopathy, lumbar region: Secondary | ICD-10-CM

## 2023-12-28 MED ORDER — CITALOPRAM HYDROBROMIDE 20 MG PO TABS
20.0000 mg | ORAL_TABLET | Freq: Every day | ORAL | 3 refills | Status: AC
Start: 2023-12-28 — End: ?

## 2023-12-28 MED ORDER — WEGOVY 2.4 MG/0.75ML ~~LOC~~ SOAJ: 2.4000 mg | SUBCUTANEOUS | 3 refills | Status: DC

## 2023-12-28 MED ORDER — HYDROCHLOROTHIAZIDE 25 MG PO TABS: 25.0000 mg | ORAL_TABLET | Freq: Every day | ORAL | 3 refills | Status: AC | PRN

## 2023-12-28 MED ORDER — ROSUVASTATIN CALCIUM 5 MG PO TABS: 5.0000 mg | ORAL_TABLET | Freq: Every day | ORAL | 3 refills | Status: AC

## 2023-12-28 MED ORDER — IBUPROFEN 800 MG PO TABS: 800.0000 mg | ORAL_TABLET | Freq: Three times a day (TID) | ORAL | 3 refills | Status: AC | PRN

## 2023-12-28 MED ORDER — GABAPENTIN 100 MG PO CAPS: ORAL_CAPSULE | ORAL | 3 refills | Status: AC

## 2023-12-28 MED ORDER — TRULANCE 3 MG PO TABS: 3.0000 mg | ORAL_TABLET | Freq: Every day | ORAL | 3 refills | Status: AC

## 2023-12-28 MED ORDER — POTASSIUM CHLORIDE ER 20 MEQ PO TBCR: 1.0000 | EXTENDED_RELEASE_TABLET | Freq: Two times a day (BID) | ORAL | 3 refills | Status: AC

## 2023-12-28 MED ORDER — RABEPRAZOLE SODIUM 20 MG PO TBEC: 20.0000 mg | DELAYED_RELEASE_TABLET | Freq: Every day | ORAL | 3 refills | Status: AC

## 2023-12-28 NOTE — Progress Notes (Signed)
 Darlene [name] is a 53 y.o. female presents to office today for annual physical exam examination.    Concerns today include: 1. None. Doing well. Needs refills.  Marital status: married, Substance use: none Health Maintenance Due  Topic Date Due   Hepatitis B Vaccines 19-59 Average Risk (2 of 3 - 19+ 3-dose series) 06/02/2021   Refills needed today: all  Immunization History  Administered Date(s) Administered   Fluzone Influenza virus vaccine,trivalent (IIV3), split virus 03/01/2015   Hepatitis B 05/05/2021   Influenza Inj Mdck Quad Pf 02/28/2021   Influenza Inj Mdck Quad With Preservative 05/19/2019   Influenza, Quadrivalent, Recombinant, Inj, Pf 04/03/2016, 02/28/2017   Influenza,inj,Quad PF,6+ Mos 03/01/2015, 04/03/2016, 02/28/2017, 03/13/2018, 03/09/2020   Influenza-Unspecified 03/01/2015   Moderna Sars-Covid-2 Vaccination 07/24/2019, 08/23/2019   Tdap 03/13/2018   Past Medical History:  Diagnosis Date   Chest pressure    Fatty liver    Fatty liver    GERD (gastroesophageal reflux disease)    Hiatal hernia    Hypertension    Hypokalemia    OA (osteoarthritis)    Palpitations    Plantar fasciitis    SOB (shortness of breath)    Vitamin D deficiency    Social History   Socioeconomic History   Marital status: Married    Spouse name: Not on file   Number of children: 3   Years of education: Not on file   Highest education level: 10th grade  Occupational History   Occupation: TEXTILE  Tobacco Use   Smoking status: Never   Smokeless tobacco: Never  Vaping Use   Vaping status: Never Used  Substance and Sexual Activity   Alcohol use: No    Alcohol/week: 0.0 standard drinks of alcohol   Drug use: No   Sexual activity: Not on file  Other Topics Concern   Not on file  Social History Narrative   Not on file   Social Drivers of Health   Financial Resource Strain: Low Risk  (05/20/2023)   Overall Financial Resource Strain (CARDIA)    Difficulty of Paying  Living Expenses: Not hard at all  Food Insecurity: No Food Insecurity (05/20/2023)   Hunger Vital Sign    Worried About Running Out of Food in the Last Year: Never true    Ran Out of Food in the Last Year: Never true  Transportation Needs: No Transportation Needs (05/20/2023)   PRAPARE - Administrator, Civil Service (Medical): No    Lack of Transportation (Non-Medical): No  Physical Activity: Unknown (05/20/2023)   Exercise Vital Sign    Days of Exercise per Week: 0 days    Minutes of Exercise per Session: Not on file  Stress: No Stress Concern Present (05/20/2023)   [employer] of Occupational Health - Occupational Stress Questionnaire    Feeling of Stress : Not at all  Social Connections: Moderately Integrated (05/20/2023)   Social Connection and Isolation Panel    Frequency of Communication with Friends and Family: Three times a week    Frequency of Social Gatherings with Friends and Family: Once a week    Attends Religious Services: More than 4 times per year    Active Member of [employer] or Organizations: No    Attends Banker Meetings: Not on file    Marital Status: Married  Intimate Partner Violence: Unknown (08/19/2021)   Received from [facility]   HITS    Physically Hurt: Not on file    Insult or Talk  Down To: Not on file    Threaten Physical Harm: Not on file    Scream or Curse: Not on file   Past Surgical History:  Procedure Laterality Date   CESAREAN SECTION  1993   FOOT SURGERY Right 1997   GALLBLADDER SURGERY     april /2024   INSERTION OF MESH N/A 01/28/2016   Procedure: INSERTION OF MESH;  Surgeon: Lynda Leos, MD;  Location: [facility];  Service: General;  Laterality: N/A;   INSERTION OF MESH  09/06/2018   Procedure: INSERTION OF MESH;  Surgeon: Leos Lynda, MD;  Location: [facility];  Service: General;;   Right Knee Surgery     tubal ligation     Family History  Problem Relation Age of Onset   High blood pressure Mother    Diabetes  Mother    Kidney cancer Mother    Sleep apnea Mother    Hyperlipidemia Sister    Heart disease Sister    Heart disease Brother    Hyperlipidemia Brother    Colon cancer Neg Hx    Rectal cancer Neg Hx    Stomach cancer Neg Hx     Current Outpatient Medications:    albuterol (VENTOLIN HFA) 108 (90 Base) MCG/ACT inhaler, INHALE 2 PUFFS BY MOUTH EVERY 4 HOURS AS NEEDED FOR WHEEZE OR FOR SHORTNESS OF BREATH, Disp: 20.1 each, Rfl: 0   amoxicillin-clavulanate (AUGMENTIN) 875-125 MG tablet, Take 1 tablet by mouth 2 (two) times daily., Disp: 14 tablet, Rfl: 0   citalopram (CELEXA) 20 MG tablet, Take 1 tablet (20 mg total) by mouth daily., Disp: 90 tablet, Rfl: 3   gabapentin (NEURONTIN) 100 MG capsule, TAKE 1 CAPSULE BY MOUTH TWICE A DAY, Disp: 180 capsule, Rfl: 0   hydrochlorothiazide (HYDRODIURIL) 25 MG tablet, Take 1 tablet (25 mg total) by mouth daily as needed (swelling)., Disp: 90 tablet, Rfl: 3   ibuprofen (ADVIL) 800 MG tablet, Take 1 tablet (800 mg total) by mouth every 8 (eight) hours as needed for moderate pain (pain score 4-6)., Disp: 270 tablet, Rfl: 3   loratadine (CLARITIN) 10 MG tablet, TAKE 1 TABLET BY MOUTH EVERY DAY, Disp: 90 tablet, Rfl: 1   Plecanatide (TRULANCE) 3 MG TABS, Take 1 tablet (3 mg total) by mouth daily., Disp: 90 tablet, Rfl: 3   Potassium Chloride ER 20 MEQ TBCR, TAKE 1 TABLET (20 MEQ TOTAL) BY MOUTH IN THE MORNING AND AT BEDTIME. FOR LOW POTASSIUM, Disp: 180 tablet, Rfl: 0   RABEprazole (ACIPHEX) 20 MG tablet, Take 20 mg by mouth daily., Disp: , Rfl:    rosuvastatin (CRESTOR) 5 MG tablet, TAKE 1 TABLET (5 MG TOTAL) BY MOUTH DAILY., Disp: 90 tablet, Rfl: 3   Semaglutide-Weight Management (WEGOVY) 2.4 MG/0.75ML SOAJ, INJECT 2.4 MG INTO THE SKIN EVERY 7 (SEVEN) DAYS., Disp: 9 mL, Rfl: 0   HYDROcodone bit-homatropine (HYCODAN) 5-1.5 MG/5ML syrup, Take 5 mLs by mouth every 6 (six) hours as needed for cough., Disp: 120 mL, Rfl: 0   nystatin (MYCOSTATIN) 100000 UNIT/ML  suspension, Gargle and spit 1 teaspoonful every 6 hours., Disp: 480 mL, Rfl: 0  No Known Allergies   ROS: Review of Systems Pertinent items noted in HPI and remainder of comprehensive ROS otherwise negative.    Physical exam BP 120/70   Pulse 82   Temp 97.9 F (36.6 C)   Ht 5' 2 (1.575 m)   Wt 197 lb 6 oz (89.5 kg)   LMP 07/19/2022 (Approximate)   SpO2 95%  BMI 36.10 kg/m  General appearance: alert, cooperative, appears stated age, no distress, and morbidly obese Head: Normocephalic, without obvious abnormality, atraumatic Eyes: negative findings: lids and lashes normal, conjunctivae and sclerae normal, corneas clear, and pupils equal, round, reactive to light and accomodation Ears: normal TM's and external ear canals both ears Nose: Nares normal. Septum midline. Mucosa normal. No drainage or sinus tenderness. Throat: lips, mucosa, and tongue normal; teeth and gums normal Neck: no adenopathy, no carotid bruit, supple, symmetrical, trachea midline, and thyroid not enlarged, symmetric, no tenderness/mass/nodules Back: symmetric, no curvature. ROM normal. No CVA tenderness. Lungs: clear to auscultation bilaterally Heart: regular rate and rhythm, S1, S2 normal, no murmur, click, rub or gallop Abdomen: soft, non-tender; bowel sounds normal; no masses,  no organomegaly Extremities: extremities normal, atraumatic, no cyanosis or edema Pulses: 2+ and symmetric Skin: Skin color, texture, turgor normal. No rashes or lesions Lymph nodes: Cervical, supraclavicular, and axillary nodes normal. Neurologic: Grossly normal      12/28/2023   11:11 AM 05/22/2023   11:32 AM 12/22/2022    8:08 AM  Depression screen PHQ 2/9  Decreased Interest 0 0 0  Down, Depressed, Hopeless 0 0 0  PHQ - 2 Score 0 0 0  Altered sleeping 0 0 0  Tired, decreased energy 0 0 0  Change in appetite 0 0 0  Feeling bad or failure about yourself  0 0 0  Trouble concentrating 0 0 0  Moving slowly or fidgety/restless 0  0 0  Suicidal thoughts 0 0 0  PHQ-9 Score 0 0 0  Difficult doing work/chores Not difficult at all  Not difficult at all      12/28/2023   11:11 AM 12/22/2022    8:08 AM 07/21/2022   10:27 AM 06/16/2022    9:26 AM  GAD 7 : Generalized Anxiety Score  Nervous, Anxious, on Edge 0 1 0 0  Control/stop worrying 0 0 0 1  Worry too much - different things 0 1 0 1  Trouble relaxing 0 1 0 1  Restless 0 0 0 0  Easily annoyed or irritable 0 0 0 0  Afraid - awful might happen 0 0 0 1  Total GAD 7 Score 0 3 0 4  Anxiety Difficulty Not difficult at all Not difficult at all Not difficult at all Not difficult at all     Assessment/ Plan: Darlene [name] here for annual physical exam.   Annual physical exam  Pure hypercholesterolemia - Plan: CMP14+EGFR, TSH, Lipid Panel, rosuvastatin (CRESTOR) 5 MG tablet  Fatty liver - Plan: CMP14+EGFR, TSH, Lipid Panel, rosuvastatin (CRESTOR) 5 MG tablet  Essential hypertension - Plan: hydrochlorothiazide (HYDRODIURIL) 25 MG tablet, Potassium Chloride ER 20 MEQ TBCR  Paroxysmal supraventricular tachycardia (HCC) - Plan: CMP14+EGFR, CBC, TSH  Pre-diabetes - Plan: CMP14+EGFR, semaglutide-weight management (WEGOVY) 2.4 MG/0.75ML SOAJ SQ injection  Morbid obesity (HCC) - Plan: CMP14+EGFR, VITAMIN D 25 Hydroxy (Vit-D Deficiency, Fractures), TSH, Lipid Panel, semaglutide-weight management (WEGOVY) 2.4 MG/0.75ML SOAJ SQ injection  Chronic constipation - Plan: Plecanatide (TRULANCE) 3 MG TABS  GAD (generalized anxiety disorder) - Plan: citalopram (CELEXA) 20 MG tablet  Spondylosis of lumbar region without myelopathy or radiculopathy - Plan: gabapentin (NEURONTIN) 100 MG capsule  She declines shingles and pneumonia vaccines today  Renewals of medications have been sent.  She continues to lose weight appropriately with [facility] and that has been renewed.  Continue diet and exercise program.  Fasting labs collected.  Blood pressure controlled.  Counseled on healthy  lifestyle choices, including diet (rich in fruits, vegetables and lean meats and low in salt and simple carbohydrates) and exercise (at least 30 minutes of moderate physical activity daily).  Patient to follow up 6 months for weight check, sooner if concerns arise  Darlene Raine M. Jolinda, DO

## 2023-12-29 LAB — CMP14+EGFR
ALT: 13 IU/L (ref 0–32)
AST: 15 IU/L (ref 0–40)
Albumin: 4.3 g/dL (ref 3.8–4.9)
Alkaline Phosphatase: 115 IU/L (ref 44–121)
BUN/Creatinine Ratio: 14 (ref 9–23)
BUN: 10 mg/dL (ref 6–24)
Bilirubin Total: 0.5 mg/dL (ref 0.0–1.2)
CO2: 24 mmol/L (ref 20–29)
Calcium: 9.2 mg/dL (ref 8.7–10.2)
Chloride: 102 mmol/L (ref 96–106)
Creatinine, Ser: 0.69 mg/dL (ref 0.57–1.00)
Globulin, Total: 2.1 g/dL (ref 1.5–4.5)
Glucose: 82 mg/dL (ref 70–99)
Potassium: 3.5 mmol/L (ref 3.5–5.2)
Sodium: 139 mmol/L (ref 134–144)
Total Protein: 6.4 g/dL (ref 6.0–8.5)
eGFR: 105 mL/min/1.73 (ref >59)

## 2023-12-29 LAB — CBC
Hematocrit: 42.1 % (ref 34.0–46.6)
Hemoglobin: 13.6 g/dL (ref 11.1–15.9)
MCH: 29.9 pg (ref 26.6–33.0)
MCHC: 32.3 g/dL (ref 31.5–35.7)
MCV: 93 fL (ref 79–97)
Platelets: 209 x10E3/uL (ref 150–450)
RBC: 4.55 x10E6/uL (ref 3.77–5.28)
RDW: 12 % (ref 11.7–15.4)
WBC: 6.6 x10E3/uL (ref 3.4–10.8)

## 2023-12-29 LAB — VITAMIN D 25 HYDROXY (VIT D DEFICIENCY, FRACTURES): Vit D, 25-Hydroxy: 43.5 ng/mL (ref 30.0–100.0)

## 2023-12-29 LAB — TSH: TSH: 0.401 u[IU]/mL — ABNORMAL LOW (ref 0.450–4.500)

## 2023-12-31 ENCOUNTER — Ambulatory Visit: Payer: Self-pay | Admitting: Family Medicine

## 2024-01-02 LAB — SPECIMEN STATUS REPORT

## 2024-01-02 LAB — T4, FREE: Free T4: 1.14 ng/dL (ref 0.82–1.77)

## 2024-01-25 NOTE — Progress Notes (Incomplete)
  Cardiology Office Note   Date:  01/25/2024  ID:  LUDWIKA RODD, DOB [birth year], MRN [id number] PCP: Jolinda Norene HERO, DO  [facility] Providers Cardiologist:  Aleene Passe, MD (Inactive) { Click to update primary MD,subspecialty MD or APP then REFRESH:1}    History of Present Illness Darlene [name] is a 53 y.o. female with a past medical history of paroxysmal SVT, coronary artery calcification, GERD s/p Nissen fundoplication, obesity and positive results with Wegovy , hyperlipidemia and palpitations presents for annual follow-up.  She was last seen by Dr. Robby on 11/28/2022 with complaints of chest pressure and palpitations and dyspnea for several weeks which was thought to be due to deconditioning and had resolved by the time she was seen in the office.  Tobacco use: *** Alcohol use: *** Activity level: *** Diet mainly consists of: ***   ROS:  ROS  Physical Exam  Physical Exam  VS:  LMP 07/19/2022 (Approximate)         Wt Readings from Last 3 Encounters:  12/28/23 197 lb 6 oz (89.5 kg)  07/16/23 207 lb (93.9 kg)  06/15/23 208 lb (94.3 kg)     EKG Interpretation Date/Time:    Ventricular Rate:    PR Interval:    QRS Duration:    QT Interval:    QTC Calculation:   R Axis:      Text Interpretation:      Studies Reviewed   Coronary CTA 08/05/2022: IMPRESSION: 1. Coronary calcium  score of 3.36. This was 32 percentile for age-, sex, and race-matched controls. 2. Normal coronary origin with right dominance. 3. No evidence of CAD.  2-week event monitor 10/19/2022:   Predominate rhythm is sinus rhythm including NSR, sinus bradycardia, sinus tachycardia   Second degree AV block  -Mobitz I ( Wenckebach) was seen   Rare PACs, Rare PVCs   No serious arrhythmias observed  2-week event monitor 08/21/22:   Pt has evidence of high degree AV block.  had a 9 second episode where HR was 24-28.   Mobitx I AV block   No symptoms reported  Echocardiogram  07/19/2022: EF 55 to 60% with no regional wall motion abnormalities and normal diastolic parameters No other significant findings   Risk Assessment/Calculations    No BP recorded.  {Refresh Note OR Click here to enter BP  :1}***         ASSESSMENT  Paroxysmal SVT***not on medications Coronary artery calcification***CAC 3.36 in 2024 (88th percentile for age, sex and race matched controls) Hyperlipidemia***on rosuvastatin .  Managed by PCP Morbid obesity*** Mobitz type I winky block***noted on event monitor in 2024 History of bradycardia***noted on prior event monitor and beta-blocker was discontinued  Plan  *** Follow up: ***     {Are you ordering a CV Procedure (e.g. stress test, cath, DCCV, TEE, etc)?   Press F2        :[id number]}    Signed, Emeline FORBES Calender, MD

## 2024-01-28 ENCOUNTER — Ambulatory Visit: Attending: Internal Medicine | Admitting: Internal Medicine

## 2024-02-26 ENCOUNTER — Other Ambulatory Visit: Payer: Self-pay | Admitting: Family Medicine

## 2024-02-29 ENCOUNTER — Telehealth: Payer: Self-pay

## 2024-02-29 ENCOUNTER — Other Ambulatory Visit (HOSPITAL_COMMUNITY): Payer: Self-pay

## 2024-02-29 ENCOUNTER — Encounter: Payer: Self-pay | Admitting: Family Medicine

## 2024-02-29 NOTE — Telephone Encounter (Signed)
 Pharmacy Patient Advocate Encounter   Received notification from Patient Advice Request messages that prior authorization for Wegovy  2.4mg /0.75ml is required/requested.   Insurance verification completed.   The patient is insured through [facility].   Per test claim: PA required; PA submitted to above mentioned insurance via Latent Key/confirmation #/EOC [id number] Status is pending

## 2024-03-03 ENCOUNTER — Other Ambulatory Visit (HOSPITAL_COMMUNITY): Payer: Self-pay

## 2024-03-03 NOTE — Telephone Encounter (Signed)
 Pharmacy Patient Advocate Encounter  Received notification from [facility] that Prior Authorization for Wegovy  2.4mg /0.56ml has been APPROVED from 02/29/24 to 02/28/25   PA #/Case ID/Reference #: [id number]  Left a message at [facility] to notify of the approval.

## 2024-04-17 ENCOUNTER — Encounter (HOSPITAL_COMMUNITY): Payer: Self-pay | Admitting: General Surgery

## 2024-04-29 ENCOUNTER — Ambulatory Visit: Payer: Self-pay

## 2024-04-29 NOTE — Telephone Encounter (Signed)
 FYI Only or Action Required?: FYI only for provider: appointment scheduled on 12/17.  Patient was last seen in primary care on 12/28/2023 by Jolinda Norene HERO, DO.  Called Nurse Triage reporting Cough.  Symptoms began yesterday.  Interventions attempted: NyQuil.  Symptoms are: gradually worsening.  Triage Disposition: See Physician Within 24 Hours  Patient/caregiver understands and will follow disposition?: Yes        Copied from CRM #[id number]. Topic: Clinical - Red Word Triage >> Apr 29, 2024  2:20 PM Wess RAMAN wrote: Red Word that prompted transfer to Nurse Triage: Cough, head killing her Reason for Disposition  [1] Continuous (nonstop) coughing interferes with work or school AND [2] no improvement using cough treatment per Care Advice  Answer Assessment - Initial Assessment Questions 1. ONSET: When did the cough begin?      Yesterday   2. SEVERITY: How bad is the cough today?      5/10 in severity of the cough   3. SPUTUM: Describe the color of your sputum (e.g., none, dry cough; clear, white, yellow, green)     Yellow   4. HEMOPTYSIS: Are you coughing up any blood? If Yes, ask: How much? (e.g., flecks, streaks, tablespoons, etc.)     No   5. DIFFICULTY BREATHING: Are you having difficulty breathing? If Yes, ask: How bad is it? (e.g., mild, moderate, severe)      No   6. FEVER: Do you have a fever? If Yes, ask: What is your temperature, how was it measured, and when did it start?     No   7. CARDIAC HISTORY: Do you have any history of heart disease? (e.g., heart attack, congestive heart failure)      No   8. LUNG HISTORY: Do you have any history of lung disease?  (e.g., pulmonary embolus, asthma, emphysema)   No   10. OTHER SYMPTOMS: Do you have any other symptoms? (e.g., runny nose, wheezing, chest pain)    Runny nose, watery eyes, headache  12. TRAVEL: Have you traveled out of the country in the last month? (e.g., travel history,  exposures)       No.    Patient called in to triage with complaints of productive cough, headache This has been ongoing since yesterday.  For home care, the patient is taking OTC NyQuil Appointment scheduled for further evaluation; and agrees with the plan of care, and will reach out if symptoms worsen or persist.  Protocols used: Cough - Acute Productive-A-AH

## 2024-04-29 NOTE — Telephone Encounter (Signed)
 Appt made.

## 2024-04-30 ENCOUNTER — Encounter: Payer: Self-pay | Admitting: Nurse Practitioner

## 2024-04-30 ENCOUNTER — Ambulatory Visit: Admitting: Nurse Practitioner

## 2024-04-30 VITALS — BP 91/65 | HR 102 | Temp 97.8°F | Ht 62.0 in | Wt 200.4 lb

## 2024-04-30 DIAGNOSIS — R0981 Nasal congestion: Secondary | ICD-10-CM | POA: Diagnosis not present

## 2024-04-30 DIAGNOSIS — R051 Acute cough: Secondary | ICD-10-CM | POA: Insufficient documentation

## 2024-04-30 DIAGNOSIS — J101 Influenza due to other identified influenza virus with other respiratory manifestations: Secondary | ICD-10-CM | POA: Insufficient documentation

## 2024-04-30 LAB — VERITOR SARS-COV-2 AND FLU A+B
BD Veritor SARS-CoV-2 Ag: NEGATIVE
Influenza A: POSITIVE — AB
Influenza B: NEGATIVE

## 2024-04-30 MED ORDER — OSELTAMIVIR PHOSPHATE 75 MG PO CAPS: 75.0000 mg | ORAL_CAPSULE | Freq: Two times a day (BID) | ORAL | 0 refills | Status: AC

## 2024-04-30 NOTE — Progress Notes (Signed)
 Subjective:  Patient ID: Darlene [name], female    DOB: [birth year], 53 y.o.   MRN: [id number]  Patient Care Team: Jolinda Norene HERO, DO as PCP - General (Family Medicine) Nahser, Aleene PARAS, MD (Inactive) as PCP - Cardiology (Cardiology)   Chief Complaint:  Nasal Congestion (Symptoms started last night ), Sore Throat, Cough, and Headache   HPI: Darlene [name] is a 53 y.o. female presenting on 04/30/2024 for Nasal Congestion (Symptoms started last night ), Sore Throat, Cough, and Headache   Discussed the use of AI scribe software for clinical note transcription with the patient, who gave verbal consent to proceed.  History of Present Illness Darlene [name] is a 53 year old female who presents with cough, nasal congestion, and fever.  She has been experiencing a productive cough since Monday night, which has not improved with Nyquil. She also reports a burning sensation in her nose and throat, along with itchiness in her ears and throat.  She experienced a fever, which her husband noted reached 103F. Tylenol  has been effective in managing the fever, which was last noted this morning around 5 AM.  She attended a work Christmas party on Sunday, where she was around many people, but no known sick contacts were identified.  Additional symptoms include nasal congestion and body aches.  Denies chest pain or shortness of breath      Relevant past medical, surgical, family, and social history reviewed and updated as indicated.  Allergies and medications reviewed and updated. Data reviewed: Chart in Epic.   Past Medical History:  Diagnosis Date   Chest pressure    Fatty liver    Fatty liver    GERD (gastroesophageal reflux disease)    Hiatal hernia    Hypertension    Hypokalemia    OA (osteoarthritis)    Palpitations    Plantar fasciitis    SOB (shortness of breath)    Vitamin D  deficiency     Past Surgical History:  Procedure Laterality Date   CESAREAN SECTION  1993    FOOT SURGERY Right 1997   GALLBLADDER SURGERY     april /2024   INSERTION OF MESH N/A 01/28/2016   Procedure: INSERTION OF MESH;  Surgeon: Lynda Leos, MD;  Location: [facility];  Service: General;  Laterality: N/A;   Right Knee Surgery     tubal ligation      Social History   Socioeconomic History   Marital status: Married    Spouse name: Not on file   Number of children: 3   Years of education: Not on file   Highest education level: 10th grade  Occupational History   Occupation: TEXTILE  Tobacco Use   Smoking status: Never   Smokeless tobacco: Never  Vaping Use   Vaping status: Never Used  Substance and Sexual Activity   Alcohol use: No    Alcohol/week: 0.0 standard drinks of alcohol   Drug use: No   Sexual activity: Not on file  Other Topics Concern   Not on file  Social History Narrative   Not on file   Social Drivers of Health   Tobacco Use: Low Risk (04/30/2024)   Patient History    Smoking Tobacco Use: Never    Smokeless Tobacco Use: Never    Passive Exposure: Not on file  Financial Resource Strain: Low Risk (05/20/2023)   Overall Financial Resource Strain (CARDIA)    Difficulty of Paying Living Expenses: Not hard at all  Food Insecurity:  No Food Insecurity (05/20/2023)   Hunger Vital Sign    Worried About Running Out of Food in the Last Year: Never true    Ran Out of Food in the Last Year: Never true  Transportation Needs: No Transportation Needs (05/20/2023)   PRAPARE - Administrator, Civil Service (Medical): No    Lack of Transportation (Non-Medical): No  Physical Activity: Unknown (05/20/2023)   Exercise Vital Sign    Days of Exercise per Week: 0 days    Minutes of Exercise per Session: Not on file  Stress: No Stress Concern Present (05/20/2023)   [employer] of Occupational Health - Occupational Stress Questionnaire    Feeling of Stress : Not at all  Social Connections: Moderately Integrated (05/20/2023)   Social Connection and Isolation  Panel    Frequency of Communication with Friends and Family: Three times a week    Frequency of Social Gatherings with Friends and Family: Once a week    Attends Religious Services: More than 4 times per year    Active Member of [employer] or Organizations: No    Attends Engineer, Structural: Not on file    Marital Status: Married  Intimate Partner Violence: Unknown (08/19/2021)   Received from [facility]   HITS    Physically Hurt: Not on file    Insult or Talk Down To: Not on file    Threaten Physical Harm: Not on file    Scream or Curse: Not on file  Depression (PHQ2-9): Low Risk (12/28/2023)   Depression (PHQ2-9)    PHQ-2 Score: 0  Alcohol Screen: Not on file  Housing: Low Risk (05/20/2023)   Housing Stability Vital Sign    Unable to Pay for Housing in the Last Year: No    Number of Times Moved in the Last Year: 0    Homeless in the Last Year: No  Utilities: Not on file  Health Literacy: Not on file    Outpatient Encounter Medications as of 04/30/2024  Medication Sig   albuterol  (VENTOLIN  HFA) 108 (90 Base) MCG/ACT inhaler INHALE 2 PUFFS BY MOUTH EVERY 4 HOURS AS NEEDED FOR WHEEZE OR FOR SHORTNESS OF BREATH   citalopram  (CELEXA ) 20 MG tablet Take 1 tablet (20 mg total) by mouth daily.   gabapentin  (NEURONTIN ) 100 MG capsule Take 1 capsule each morning and 2 capsules at bedtime   hydrochlorothiazide  (HYDRODIURIL ) 25 MG tablet Take 1 tablet (25 mg total) by mouth daily as needed (swelling).   ibuprofen  (ADVIL ) 800 MG tablet Take 1 tablet (800 mg total) by mouth every 8 (eight) hours as needed for moderate pain (pain score 4-6).   loratadine (CLARITIN) 10 MG tablet TAKE 1 TABLET BY MOUTH EVERY DAY   oseltamivir  (TAMIFLU ) 75 MG capsule Take 1 capsule (75 mg total) by mouth 2 (two) times daily.   Plecanatide  (TRULANCE ) 3 MG TABS Take 1 tablet (3 mg total) by mouth daily.   Potassium Chloride  ER 20 MEQ TBCR Take 1 tablet (20 mEq total) by mouth in the morning and at bedtime.    RABEprazole  (ACIPHEX ) 20 MG tablet Take 1 tablet (20 mg total) by mouth daily.   rosuvastatin  (CRESTOR ) 5 MG tablet Take 1 tablet (5 mg total) by mouth daily.   semaglutide -weight management (WEGOVY ) 2.4 MG/0.75ML SOAJ SQ injection Inject 2.4 mg into the skin every 7 (seven) days.   No facility-administered encounter medications on file as of 04/30/2024.    Allergies[1]  Review of Systems  Constitutional:  Positive  for fever. Negative for chills.       This morning around 5 am 103  HENT:  Positive for congestion and ear pain. Negative for sore throat.   Respiratory:  Positive for cough. Negative for sputum production, shortness of breath and wheezing.        Yellow sputum  Cardiovascular:  Negative for chest pain and leg swelling.  Gastrointestinal:  Negative for abdominal pain, blood in stool, diarrhea, nausea and vomiting.  Musculoskeletal:  Positive for myalgias.  Skin:  Negative for itching and rash.  Neurological:  Positive for headaches. Negative for dizziness.         Objective:  BP 91/65   Pulse (!) 102   Temp 97.8 F (36.6 C) (Temporal)   Ht 5' 2 (1.575 m)   Wt 200 lb 6.4 oz (90.9 kg)   LMP 07/19/2022   SpO2 96%   BMI 36.65 kg/m    Wt Readings from Last 3 Encounters:  04/30/24 200 lb 6.4 oz (90.9 kg)  12/28/23 197 lb 6 oz (89.5 kg)  07/16/23 207 lb (93.9 kg)    Physical Exam Vitals and nursing note reviewed.  Constitutional:      General: She is not in acute distress. HENT:     Head: Normocephalic and atraumatic.     Right Ear: Tympanic membrane, ear canal and external ear normal. There is no impacted cerumen.     Left Ear: Tympanic membrane, ear canal and external ear normal. There is no impacted cerumen.     Nose: Nasal tenderness present.     Right Turbinates: Swollen.     Left Turbinates: Swollen.     Right Sinus: Frontal sinus tenderness present.     Left Sinus: Frontal sinus tenderness present.  Eyes:     General: No scleral icterus.     Extraocular Movements: Extraocular movements intact.     Conjunctiva/sclera: Conjunctivae normal.     Pupils: Pupils are equal, round, and reactive to light.  Cardiovascular:     Heart sounds: Normal heart sounds.  Pulmonary:     Effort: Pulmonary effort is normal.     Breath sounds: Normal breath sounds.  Musculoskeletal:        General: Normal range of motion.     Right lower leg: No edema.     Left lower leg: No edema.  Skin:    General: Skin is warm and dry.     Findings: No rash.  Neurological:     Mental Status: She is alert and oriented to person, place, and time.  Psychiatric:        Mood and Affect: Mood normal.        Behavior: Behavior normal.        Thought Content: Thought content normal.        Judgment: Judgment normal.    Physical Exam HEENT: No sinus tenderness CHEST: Lungs clear to auscultation bilaterally   Positive influenza A  Results for orders placed or performed in visit on 12/28/23  CMP14+EGFR   Collection Time: 12/28/23 11:28 AM  Result Value Ref Range   Glucose 82 70 - 99 mg/dL   BUN 10 6 - 24 mg/dL   Creatinine, Ser 9.30 0.57 - 1.00 mg/dL   eGFR 894 >40 fO/fpw/8.26   BUN/Creatinine Ratio 14 9 - 23   Sodium 139 134 - 144 mmol/L   Potassium 3.5 3.5 - 5.2 mmol/L   Chloride 102 96 - 106 mmol/L   CO2 24 20 - 29 mmol/L  Calcium  9.2 8.7 - 10.2 mg/dL   Total Protein 6.4 6.0 - 8.5 g/dL   Albumin 4.3 3.8 - 4.9 g/dL   Globulin, Total 2.1 1.5 - 4.5 g/dL   Bilirubin Total 0.5 0.0 - 1.2 mg/dL   Alkaline Phosphatase 115 44 - 121 IU/L   AST 15 0 - 40 IU/L   ALT 13 0 - 32 IU/L  VITAMIN D  25 Hydroxy (Vit-D Deficiency, Fractures)   Collection Time: 12/28/23 11:28 AM  Result Value Ref Range   Vit D, 25-Hydroxy 43.5 30.0 - 100.0 ng/mL  CBC   Collection Time: 12/28/23 11:28 AM  Result Value Ref Range   WBC 6.6 3.4 - 10.8 x10E3/uL   RBC 4.55 3.77 - 5.28 x10E6/uL   Hemoglobin 13.6 11.1 - 15.9 g/dL   Hematocrit 57.8 65.9 - 46.6 %   MCV 93 79 - 97 fL    MCH 29.9 26.6 - 33.0 pg   MCHC 32.3 31.5 - 35.7 g/dL   RDW 87.9 88.2 - 84.5 %   Platelets 209 150 - 450 x10E3/uL  TSH   Collection Time: 12/28/23 11:28 AM  Result Value Ref Range   TSH 0.401 (L) 0.450 - 4.500 uIU/mL  T4, free   Collection Time: 12/28/23 11:28 AM  Result Value Ref Range   Free T4 1.14 0.82 - 1.77 ng/dL  Specimen status report   Collection Time: 12/28/23 11:28 AM  Result Value Ref Range   specimen status report Comment        Pertinent labs & imaging results that were available during my care of the patient were reviewed by me and considered in my medical decision making.  Assessment & Plan:  Amelianna was seen today for nasal congestion, sore throat, cough and headache.  Diagnoses and all orders for this visit:  Acute cough -     Veritor SARS-CoV-2 and Flu A+B -     oseltamivir  (TAMIFLU ) 75 MG capsule; Take 1 capsule (75 mg total) by mouth 2 (two) times daily.  Influenza A virus present -     oseltamivir  (TAMIFLU ) 75 MG capsule; Take 1 capsule (75 mg total) by mouth 2 (two) times daily.  Nasal congestion -     Veritor SARS-CoV-2 and Flu A+B -     oseltamivir  (TAMIFLU ) 75 MG capsule; Take 1 capsule (75 mg total) by mouth 2 (two) times daily.     Assessment and Plan Ciella 53 year old Caucasian female seen today for viral URI, no acute distress Assessment & Plan Influenza A with respiratory manifestations Acute influenza A confirmed by positive swab. Fever at 103F. Respiratory symptoms present. - Started Tamiflu . - Continue Tylenol  and ibuprofen  for fever. - Advised work note for return on Monday. - Encouraged hydration and comfort food.      Continue all other maintenance medications.  Follow up plan: Return if symptoms worsen or fail to improve.   Continue healthy lifestyle choices, including diet (rich in fruits, vegetables, and lean proteins, and low in salt and simple carbohydrates) and exercise (at least 30 minutes of moderate physical  activity daily).  Educational handout given for   Clinical References  Preventing Influenza, Adult Influenza, also known as the flu, is an infection caused by a germ called a virus. It affects your respiratory system, which includes your nose, throat, windpipe, and lungs. The flu causes cold symptoms, as well as a high fever and body aches. The flu is contagious, which means it spreads easily from person to person. You're more likely to get  it during flu season, which goes from December to March. You can get the flu by: Breathing in droplets when an infected person coughs or sneezes. Touching something that has the germs on it and then touching your mouth, nose, or eyes. How can the flu affect me? Having the flu can lead to other infections. These may include: A lung infection, like pneumonia. An ear infection. A sinus infection. If you get the flu, you may infect the people around you. The flu can be very serious for: Babies. People 26 years old and older. People with long-term diseases. What can increase my risk? You may be more likely to get the flu if: You don't wash your hands often. You have close contact with a lot of people during flu season. You touch your mouth, eyes, or nose without first washing your hands. You don't get the vaccine for the flu each year. This vaccine is also called the flu shot. You work in health care. You may be at risk for more serious flu symptoms if: You're 65 years and older. You're pregnant. Your body's defense, or immune, system is weak. You have a condition that makes the flu worse. You're very overweight. What actions can I take to prevent the flu? Lifestyle Keep your body's immune system strong. To do this: Eat a healthy diet. Drink enough fluid to keep your pee (urine) pale yellow. Get enough sleep. Get enough exercise. Do not use any products that contain nicotine or tobacco. These products include cigarettes, chewing tobacco, and  vaping devices, such as e-cigarettes. If you need help quitting, ask your health care provider. Medicines Get a flu shot each year. It's the best way to prevent the flu. Try to get the shot as soon as you can in the fall. But getting a flu shot in the winter or spring instead is still a good idea. Flu season can last into early spring. You need to get a new flu shot each year. This is because the germs that cause the flu can change slightly from year to year. If you get the flu after you get the shot, the shot can make your illness shorter and milder. It can also stop you from getting other serious infections caused by the flu. If you're pregnant, you can and should get a flu shot. Check with your provider before getting a flu shot if you've had a reaction to the shot in the past. You may be able to get the vaccine as a nasal spray instead. But the nasal spray may not work as well as the shot. Check with your provider if you have questions about this. Most people who have the flu get better by resting at home and drinking lots of fluids. But a medicine called an antiviral may lessen your symptoms and make the flu go away faster. People with more serious flu symptoms may need this medicine. Ask your provider if you need it. The medicine must be started within a few days of getting symptoms.   General information     Practice good health habits. Good habits are key during flu season. Avoid contact with people who are sick with flu or cold symptoms. Wash your hands with soap and water  often and for at least 20 seconds. If soap and water  aren't available, use hand sanitizer. Try not to touch your face, especially if you haven't washed your hands. Clean surfaces at home and at work that may have germs on them. Use a cleanser  called a disinfectant that kills germs. If you get the flu: Stay home until your symptoms, such as your fever, have been gone for at least 24 hours. Cover your mouth and nose  when you cough or sneeze. Avoid close contact with others.   Where to find more information Centers for Disease Control and Prevention (CDC): tonerpromos.no American Academy of Family Physicians (AAFP): familydoctor.org Contact a health care provider if: You have the flu, and you get new symptoms. You have watery poop, or diarrhea. You have a fever. Your cough gets worse. You make more mucus. Get help right away if: You have trouble breathing. You have chest pain. These symptoms may be an emergency. Get help right away. Call 911. Do not wait to see if the symptoms will go away. Do not drive yourself to the hospital. This information is not intended to replace advice given to you by your health care provider. Make sure you discuss any questions you have with your health care provider. Document Revised: 07/27/2022 Document Reviewed: 07/27/2022 Elsevier Patient Education  2024 Elsevier Inc. Cough, Adult A cough helps to clear your throat and lungs. It may be a sign of an illness or another condition. A short-term (acute) cough may last 2-3 weeks. A long-term (chronic) cough may last 8 or more weeks. Many things can cause a cough. They include: Illnesses such as: An infection in your throat or lungs. Asthma or other heart or lung problems. Gastroesophageal reflux. This is when acid comes back up from your stomach. Breathing in things that bother (irritate) your lungs. Allergies. Postnasal drip. This is when mucus runs down the back of your throat. Smoking. Some medicines. Follow these instructions at home: Medicines Take over-the-counter and prescription medicines only as told by your doctor. Talk with your doctor before you take cough medicine (cough suppressants). Eating and drinking Do not drink alcohol. Do not drink caffeine. Drink enough fluid to keep your pee (urine) pale yellow. Lifestyle Stay away from cigarette smoke. Do not smoke or use any products that contain nicotine  or tobacco. If you need help quitting, ask your doctor. Stay away from things that make you cough. These may include perfume, candles, cleaning products, or campfire smoke. General instructions  Watch for any changes to your cough. Tell your doctor about them. Always cover your mouth when you cough. If the air is dry in your home, use a cool mist vaporizer or humidifier. If your cough is worse at night, try using extra pillows to raise your head up higher while you sleep. Rest as needed. Contact a doctor if: You have new symptoms. Your symptoms get worse. You cough up pus. You have a fever that does not go away. Your cough does not get better after 2-3 weeks. Cough medicine does not help, and you are not sleeping well. You have pain that gets worse or is not helped with medicine. You are losing weight and do not know why. You have night sweats. Get help right away if: You cough up blood. You have trouble breathing. Your heart is beating very fast. These symptoms may be an emergency. Get help right away. Call 911. Do not wait to see if the symptoms will go away. Do not drive yourself to the hospital. This information is not intended to replace advice given to you by your health care provider. Make sure you discuss any questions you have with your health care provider. Document Revised: 12/30/2021 Document Reviewed: 12/30/2021 Elsevier Patient Education  2024 Elsevier  Inc.  The above assessment and management plan was discussed with the patient. The patient verbalized understanding of and has agreed to the management plan. Patient is aware to call the clinic if they develop any new symptoms or if symptoms persist or worsen. Patient is aware when to return to the clinic for a follow-up visit. Patient educated on when it is appropriate to go to the emergency department.   Mccabe Gloria [place] Thompson, DNP [facility] [address] [place], [place] [address] [phone]       [1] No Known Allergies

## 2024-05-01 ENCOUNTER — Ambulatory Visit: Payer: Self-pay | Admitting: Nurse Practitioner

## 2024-06-03 ENCOUNTER — Encounter: Payer: Self-pay | Admitting: Family Medicine

## 2024-06-03 DIAGNOSIS — K760 Fatty (change of) liver, not elsewhere classified: Secondary | ICD-10-CM | POA: Diagnosis not present

## 2024-06-03 DIAGNOSIS — E7800 Pure hypercholesterolemia, unspecified: Secondary | ICD-10-CM

## 2024-06-03 DIAGNOSIS — R7303 Prediabetes: Secondary | ICD-10-CM

## 2024-06-03 DIAGNOSIS — I10 Essential (primary) hypertension: Secondary | ICD-10-CM

## 2024-06-03 MED ORDER — ZEPBOUND 15 MG/0.5ML ~~LOC~~ SOAJ: 15.0000 mg | SUBCUTANEOUS | 3 refills | Status: AC

## 2024-06-03 MED ORDER — ZEPBOUND 10 MG/0.5ML ~~LOC~~ SOAJ: 10.0000 mg | SUBCUTANEOUS | 0 refills | Status: AC

## 2024-06-03 MED ORDER — ZEPBOUND 7.5 MG/0.5ML ~~LOC~~ SOAJ: 7.5000 mg | SUBCUTANEOUS | 0 refills | Status: AC

## 2024-06-03 MED ORDER — ZEPBOUND 12.5 MG/0.5ML ~~LOC~~ SOAJ: 12.5000 mg | SUBCUTANEOUS | 0 refills | Status: AC

## 2024-06-03 NOTE — Telephone Encounter (Signed)

## 2024-06-10 ENCOUNTER — Encounter: Payer: Self-pay | Admitting: Family Medicine

## 2024-06-10 DIAGNOSIS — F959 Tic disorder, unspecified: Secondary | ICD-10-CM

## 2024-07-07 ENCOUNTER — Ambulatory Visit: Payer: Self-pay | Admitting: Family Medicine

## 2025-01-02 ENCOUNTER — Encounter: Payer: Self-pay | Admitting: Family Medicine
